# Patient Record
Sex: Female | Born: 1960 | Race: White | Hispanic: No | Marital: Married | Smoking: Never smoker
Health system: Southern US, Community
[De-identification: ages and names within clinical notes are randomized; demographics above are authoritative.]

## PROBLEM LIST (undated history)

## (undated) DIAGNOSIS — L409 Psoriasis, unspecified: Secondary | ICD-10-CM

## (undated) DIAGNOSIS — K219 Gastro-esophageal reflux disease without esophagitis: Secondary | ICD-10-CM

## (undated) DIAGNOSIS — E221 Hyperprolactinemia: Secondary | ICD-10-CM

## (undated) HISTORY — DX: Psoriasis, unspecified: L40.9

## (undated) HISTORY — DX: Hyperprolactinemia: E22.1

## (undated) HISTORY — PX: OTHER SURGICAL HISTORY: SHX169

## (undated) HISTORY — PX: FOOT SURGERY: SHX648

## (undated) HISTORY — PX: MOUTH SURGERY: SHX715

## (undated) HISTORY — PX: BUNIONECTOMY: SHX129

## (undated) HISTORY — DX: Gastro-esophageal reflux disease without esophagitis: K21.9

---

## 1997-08-15 ENCOUNTER — Other Ambulatory Visit: Admission: RE | Admit: 1997-08-15 | Discharge: 1997-08-15 | Payer: Self-pay | Admitting: Obstetrics and Gynecology

## 1998-03-06 ENCOUNTER — Ambulatory Visit (HOSPITAL_COMMUNITY): Admission: RE | Admit: 1998-03-06 | Discharge: 1998-03-06 | Payer: Self-pay | Admitting: General Surgery

## 1998-03-06 ENCOUNTER — Encounter: Payer: Self-pay | Admitting: General Surgery

## 1998-03-08 ENCOUNTER — Ambulatory Visit (HOSPITAL_COMMUNITY): Admission: RE | Admit: 1998-03-08 | Discharge: 1998-03-08 | Payer: Self-pay

## 1998-03-08 ENCOUNTER — Encounter: Payer: Self-pay | Admitting: General Surgery

## 1998-08-09 ENCOUNTER — Other Ambulatory Visit: Admission: RE | Admit: 1998-08-09 | Discharge: 1998-08-09 | Payer: Self-pay | Admitting: Obstetrics and Gynecology

## 1998-09-15 ENCOUNTER — Encounter: Payer: Self-pay | Admitting: Obstetrics and Gynecology

## 1998-09-15 ENCOUNTER — Ambulatory Visit (HOSPITAL_COMMUNITY): Admission: RE | Admit: 1998-09-15 | Discharge: 1998-09-15 | Payer: Self-pay | Admitting: Obstetrics and Gynecology

## 1999-04-09 ENCOUNTER — Ambulatory Visit (HOSPITAL_COMMUNITY): Admission: RE | Admit: 1999-04-09 | Discharge: 1999-04-09 | Payer: Self-pay | Admitting: General Surgery

## 1999-04-09 ENCOUNTER — Encounter: Payer: Self-pay | Admitting: General Surgery

## 1999-08-09 ENCOUNTER — Other Ambulatory Visit: Admission: RE | Admit: 1999-08-09 | Discharge: 1999-08-09 | Payer: Self-pay | Admitting: Obstetrics and Gynecology

## 1999-09-11 ENCOUNTER — Ambulatory Visit (HOSPITAL_COMMUNITY): Admission: RE | Admit: 1999-09-11 | Discharge: 1999-09-11 | Payer: Self-pay | Admitting: Obstetrics and Gynecology

## 1999-09-11 ENCOUNTER — Encounter: Payer: Self-pay | Admitting: Obstetrics and Gynecology

## 1999-10-19 ENCOUNTER — Ambulatory Visit (HOSPITAL_COMMUNITY): Admission: RE | Admit: 1999-10-19 | Discharge: 1999-10-19 | Payer: Self-pay | Admitting: Gastroenterology

## 2000-02-19 HISTORY — PX: DILATION AND CURETTAGE OF UTERUS: SHX78

## 2000-02-19 HISTORY — PX: PELVIC LAPAROSCOPY: SHX162

## 2000-02-19 HISTORY — PX: HYSTEROSCOPY: SHX211

## 2000-04-10 ENCOUNTER — Encounter: Payer: Self-pay | Admitting: General Surgery

## 2000-04-10 ENCOUNTER — Ambulatory Visit (HOSPITAL_COMMUNITY): Admission: RE | Admit: 2000-04-10 | Discharge: 2000-04-10 | Payer: Self-pay | Admitting: General Surgery

## 2000-05-15 ENCOUNTER — Encounter (INDEPENDENT_AMBULATORY_CARE_PROVIDER_SITE_OTHER): Payer: Self-pay | Admitting: Specialist

## 2000-05-15 ENCOUNTER — Ambulatory Visit (HOSPITAL_COMMUNITY): Admission: RE | Admit: 2000-05-15 | Discharge: 2000-05-15 | Payer: Self-pay | Admitting: Obstetrics and Gynecology

## 2000-08-11 ENCOUNTER — Other Ambulatory Visit: Admission: RE | Admit: 2000-08-11 | Discharge: 2000-08-11 | Payer: Self-pay | Admitting: Obstetrics and Gynecology

## 2001-04-10 ENCOUNTER — Ambulatory Visit (HOSPITAL_COMMUNITY): Admission: RE | Admit: 2001-04-10 | Discharge: 2001-04-10 | Payer: Self-pay | Admitting: Obstetrics and Gynecology

## 2001-04-10 ENCOUNTER — Encounter: Payer: Self-pay | Admitting: Obstetrics and Gynecology

## 2001-04-14 ENCOUNTER — Encounter: Payer: Self-pay | Admitting: Obstetrics and Gynecology

## 2001-04-14 ENCOUNTER — Encounter: Admission: RE | Admit: 2001-04-14 | Discharge: 2001-04-14 | Payer: Self-pay | Admitting: Obstetrics and Gynecology

## 2001-05-25 ENCOUNTER — Encounter: Payer: Self-pay | Admitting: *Deleted

## 2001-05-25 ENCOUNTER — Emergency Department (HOSPITAL_COMMUNITY): Admission: EM | Admit: 2001-05-25 | Discharge: 2001-05-25 | Payer: Self-pay | Admitting: *Deleted

## 2001-08-13 ENCOUNTER — Other Ambulatory Visit: Admission: RE | Admit: 2001-08-13 | Discharge: 2001-08-13 | Payer: Self-pay | Admitting: Obstetrics and Gynecology

## 2001-08-22 ENCOUNTER — Ambulatory Visit (HOSPITAL_COMMUNITY): Admission: RE | Admit: 2001-08-22 | Discharge: 2001-08-22 | Payer: Self-pay | Admitting: Obstetrics and Gynecology

## 2001-08-22 ENCOUNTER — Encounter: Payer: Self-pay | Admitting: Obstetrics and Gynecology

## 2002-01-11 ENCOUNTER — Encounter (INDEPENDENT_AMBULATORY_CARE_PROVIDER_SITE_OTHER): Payer: Self-pay

## 2002-01-11 ENCOUNTER — Inpatient Hospital Stay (HOSPITAL_COMMUNITY): Admission: RE | Admit: 2002-01-11 | Discharge: 2002-01-13 | Payer: Self-pay | Admitting: Obstetrics and Gynecology

## 2002-01-11 HISTORY — PX: TOTAL ABDOMINAL HYSTERECTOMY: SHX209

## 2002-01-18 ENCOUNTER — Encounter: Payer: Self-pay | Admitting: Obstetrics and Gynecology

## 2002-01-18 ENCOUNTER — Ambulatory Visit (HOSPITAL_COMMUNITY): Admission: RE | Admit: 2002-01-18 | Discharge: 2002-01-18 | Payer: Self-pay | Admitting: Obstetrics and Gynecology

## 2002-04-12 ENCOUNTER — Encounter: Payer: Self-pay | Admitting: Obstetrics and Gynecology

## 2002-04-12 ENCOUNTER — Ambulatory Visit (HOSPITAL_COMMUNITY): Admission: RE | Admit: 2002-04-12 | Discharge: 2002-04-12 | Payer: Self-pay | Admitting: Obstetrics and Gynecology

## 2002-07-27 ENCOUNTER — Other Ambulatory Visit: Admission: RE | Admit: 2002-07-27 | Discharge: 2002-07-27 | Payer: Self-pay | Admitting: Obstetrics and Gynecology

## 2003-04-22 ENCOUNTER — Ambulatory Visit (HOSPITAL_COMMUNITY): Admission: RE | Admit: 2003-04-22 | Discharge: 2003-04-22 | Payer: Self-pay | Admitting: Obstetrics and Gynecology

## 2003-07-28 ENCOUNTER — Other Ambulatory Visit: Admission: RE | Admit: 2003-07-28 | Discharge: 2003-07-28 | Payer: Self-pay | Admitting: Obstetrics and Gynecology

## 2004-04-25 ENCOUNTER — Ambulatory Visit (HOSPITAL_COMMUNITY): Admission: RE | Admit: 2004-04-25 | Discharge: 2004-04-25 | Payer: Self-pay | Admitting: General Surgery

## 2004-08-08 ENCOUNTER — Other Ambulatory Visit: Admission: RE | Admit: 2004-08-08 | Discharge: 2004-08-08 | Payer: Self-pay | Admitting: Obstetrics and Gynecology

## 2005-05-06 ENCOUNTER — Ambulatory Visit (HOSPITAL_COMMUNITY): Admission: RE | Admit: 2005-05-06 | Discharge: 2005-05-06 | Payer: Self-pay | Admitting: Obstetrics and Gynecology

## 2005-08-12 ENCOUNTER — Other Ambulatory Visit: Admission: RE | Admit: 2005-08-12 | Discharge: 2005-08-12 | Payer: Self-pay | Admitting: Obstetrics and Gynecology

## 2006-05-12 ENCOUNTER — Ambulatory Visit (HOSPITAL_COMMUNITY): Admission: RE | Admit: 2006-05-12 | Discharge: 2006-05-12 | Payer: Self-pay | Admitting: General Surgery

## 2006-08-14 ENCOUNTER — Other Ambulatory Visit: Admission: RE | Admit: 2006-08-14 | Discharge: 2006-08-14 | Payer: Self-pay | Admitting: Obstetrics and Gynecology

## 2007-02-28 ENCOUNTER — Emergency Department (HOSPITAL_COMMUNITY): Admission: EM | Admit: 2007-02-28 | Discharge: 2007-02-28 | Payer: Self-pay | Admitting: Emergency Medicine

## 2007-03-05 ENCOUNTER — Encounter: Admission: RE | Admit: 2007-03-05 | Discharge: 2007-03-27 | Payer: Self-pay | Admitting: Family Medicine

## 2007-05-15 ENCOUNTER — Encounter: Admission: RE | Admit: 2007-05-15 | Discharge: 2007-05-15 | Payer: Self-pay | Admitting: Obstetrics and Gynecology

## 2007-08-17 ENCOUNTER — Other Ambulatory Visit: Admission: RE | Admit: 2007-08-17 | Discharge: 2007-08-17 | Payer: Self-pay | Admitting: Obstetrics and Gynecology

## 2008-04-18 ENCOUNTER — Ambulatory Visit: Payer: Self-pay | Admitting: Obstetrics and Gynecology

## 2008-05-16 ENCOUNTER — Encounter: Admission: RE | Admit: 2008-05-16 | Discharge: 2008-05-16 | Payer: Self-pay | Admitting: Addiction Medicine

## 2008-08-17 ENCOUNTER — Encounter: Payer: Self-pay | Admitting: Obstetrics and Gynecology

## 2008-08-17 ENCOUNTER — Ambulatory Visit: Payer: Self-pay | Admitting: Obstetrics and Gynecology

## 2008-08-17 ENCOUNTER — Other Ambulatory Visit: Admission: RE | Admit: 2008-08-17 | Discharge: 2008-08-17 | Payer: Self-pay | Admitting: Obstetrics and Gynecology

## 2008-08-19 ENCOUNTER — Ambulatory Visit: Payer: Self-pay | Admitting: Obstetrics and Gynecology

## 2008-09-06 ENCOUNTER — Ambulatory Visit: Payer: Self-pay | Admitting: Obstetrics and Gynecology

## 2008-10-06 ENCOUNTER — Ambulatory Visit: Payer: Self-pay | Admitting: Obstetrics and Gynecology

## 2008-11-04 ENCOUNTER — Encounter: Admission: RE | Admit: 2008-11-04 | Discharge: 2008-11-04 | Payer: Self-pay | Admitting: Family Medicine

## 2008-12-23 ENCOUNTER — Ambulatory Visit: Payer: Self-pay | Admitting: Family Medicine

## 2008-12-23 DIAGNOSIS — J309 Allergic rhinitis, unspecified: Secondary | ICD-10-CM | POA: Insufficient documentation

## 2008-12-23 DIAGNOSIS — J029 Acute pharyngitis, unspecified: Secondary | ICD-10-CM

## 2008-12-24 ENCOUNTER — Encounter: Payer: Self-pay | Admitting: Family Medicine

## 2009-04-21 ENCOUNTER — Ambulatory Visit: Payer: Self-pay | Admitting: Obstetrics and Gynecology

## 2009-05-22 ENCOUNTER — Encounter: Admission: RE | Admit: 2009-05-22 | Discharge: 2009-05-22 | Payer: Self-pay | Admitting: Obstetrics and Gynecology

## 2009-09-06 ENCOUNTER — Other Ambulatory Visit: Admission: RE | Admit: 2009-09-06 | Discharge: 2009-09-06 | Payer: Self-pay | Admitting: Obstetrics and Gynecology

## 2009-09-06 ENCOUNTER — Ambulatory Visit: Payer: Self-pay | Admitting: Obstetrics and Gynecology

## 2010-04-27 ENCOUNTER — Other Ambulatory Visit: Payer: Self-pay | Admitting: Obstetrics and Gynecology

## 2010-04-27 DIAGNOSIS — Z1231 Encounter for screening mammogram for malignant neoplasm of breast: Secondary | ICD-10-CM

## 2010-05-29 ENCOUNTER — Ambulatory Visit
Admission: RE | Admit: 2010-05-29 | Discharge: 2010-05-29 | Disposition: A | Discharge status: Home / Self Care | Payer: Self-pay | Source: Ambulatory Visit | Attending: Obstetrics and Gynecology | Admitting: Obstetrics and Gynecology

## 2010-05-29 DIAGNOSIS — Z1231 Encounter for screening mammogram for malignant neoplasm of breast: Secondary | ICD-10-CM

## 2010-07-06 NOTE — Op Note (Signed)
NAMEALIMA, Hailey [name]                           ACCOUNT NO.:  [id number]   MEDICAL RECORD NO.:  [id number]                   PATIENT TYPE:  INP   LOCATION:  X010                                 FACILITY:  [facility]   PHYSICIAN:  Daniel L. Eda Paschal, M.D.           DATE OF BIRTH:  [birth year]   DATE OF PROCEDURE:  01/11/2002  DATE OF DISCHARGE:                                 OPERATIVE REPORT   PREOPERATIVE DIAGNOSES:  Severe dysmenorrhea, endometriosis.   POSTOPERATIVE DIAGNOSES:  1. Severe dysmenorrhea, endometriosis.  2. Right ovarian cyst.   OPERATION:  Exploratory laparotomy with total abdominal hysterectomy,  fulguration of endometriosis of the left ovary and right ovarian cystectomy.   SURGEON:  Daniel L. Eda Paschal, M.D.   FIRST ASSISTANT:  Katy Fitch, M.D.   ANESTHESIA:  General endotracheal anesthesia.   FINDINGS:  At the time of laparotomy the patient's uterus was slightly  enlarged and  boggy. The left ovary had some  filmy adhesions and in  addition it had some surface endometriosis that looked like black  endometriosis and appeared to be more superficial than deep. The total area  involved was about 0.5 cm. The cul-de-sac was free of disease. The pelvic  peritoneum was free of disease. The ileocecal junction was identified and  the appendix was normal. The right ovary had a 4+ cm right ovarian cyst  which appeared to be functional and other than that, that side was normal.   DESCRIPTION OF PROCEDURE:  After adequate general endotracheal anesthesia,  the patient was placed in the supine position and prepped and draped in the  usual sterile manner. The Foley catheter was inserted in the patient's  bladder. A Pfannenstiel incision was made. The fascia was opened  transversely. The peritoneum was entered vertically. Subcutaneous bleeders  were clamped and bovied as encountered. When the peritoneal cavity was  opened the above findings were noted. The round ligaments  were bovied and  cut. The vesicouterine fold, the peritoneum and the posterior peritoneum  were sharply dissected free. Initially the thought was that the adnexa would  be left intact and therefore the utero-ovarian ligaments and fallopian tubes  were clamped, cut and doubly suture ligated with #1 chromic cat  gut. The  uterine arteries were clamped, cut and doubly suture ligated with #1 chromic  cat gut. The peritoneum posteriorly was sharply dissected free and then the  parametrium was taken down in successive bites by clamping and cutting and  suture ligated with #1 chromic cat gut. The cervical vaginal junction was  identified and entered by sharp dissection. The uterus was removed and sent  to pathology for tissue diagnosis. Angled sutures were placed in the angles  of the vagina with #1 chromic cat gut incorporating vaginal cuff,  uterosacral ligaments and parametrium for good vault support. The cuff was  then  closed with a running locking 3-0 Vicryl suture. The endometriosis in  the left ovary was minimal; it was fulgurated until it was gone. At this  point the right ovarian cyst had decompressed on its own, and it was noted  to be bleeding from the ovarian cyst wall, it was felt prudent to remove it.  Therefore the ovarian cyst was removed and then the defect in the ovarian  capsule was closed with a running 3-0 Vicryl. Estimated blood loss so far  was 250 cc; no blood was replaced. Copious irrigation was done with Ringer's  lactate. Two sponge,  needle and instrument counts were correct. The  peritoneum was closed with a  running 0 Vicryl. The sub and superfascial spaces were copiously irrigated  with Ringer's lactate. The fascia was closed with two running 0 Vicryl and  the skin was closed with staples. The patient left the operating room in  satisfactory condition draining clear urine from her Foley catheter.                                               Daniel L. Eda Paschal,  M.D.    Hailey [name]  D:  01/11/2002  T:  01/11/2002  Job:  [id number]

## 2010-07-06 NOTE — Discharge Summary (Signed)
   Hailey [name], Hailey [name]                           ACCOUNT NO.:  [id number]   MEDICAL RECORD NO.:  [id number]                   PATIENT TYPE:  INP   LOCATION:  0463                                 FACILITY:  [facility]   PHYSICIAN:  Rande Brunt. Eda Paschal, M.D.           DATE OF BIRTH:  09/13/1960   DATE OF ADMISSION:  01/11/2002  DATE OF DISCHARGE:  01/13/2002                                 DISCHARGE SUMMARY   HISTORY OF PRESENT ILLNESS:  The patient is a 50 year old female who entered  the hospital with dysmenorrhea, pelvic pain, endometriosis for definitive  surgery.  On the day of admission she was taken to the operating room.  Total abdominal hysterectomy, fulguration of endometriosis of left ovary,  right ovarian cystectomy were done.  Postoperatively she had a mild ileus  but by the second postoperative day was ready for discharge.   DISCHARGE MEDICATIONS:  Tylox for pain relief.   DISCHARGE DIET:  Soft.   DISCHARGE ACTIVITIES:  Ambulatory.   WOUND CARE:  Routine.   FOLLOW UP:  She will return to our office in two days for staple removal.   PATHOLOGY:  Final pathology report revealed multiple leiomyoma and a benign  corpus luteal cyst of right ovary.   CONDITION ON DISCHARGE:  Improved.   DISCHARGE DIAGNOSES:  1. Pelvic pain.  2. Dysmenorrhea.  3. Leiomyoma uteri.  4. Endometriosis.   OPERATION:  1. Total abdominal hysterectomy.  2. Fulguration of endometriosis of left ovary.  3. Right ovarian cystectomy.                                               Daniel L. Eda Paschal, M.D.    Hailey [name]  D:  01/13/2002  T:  01/13/2002  Job:  [id number]

## 2010-07-06 NOTE — Op Note (Signed)
[facility]  Patient:    Hailey [name], Hailey [name]                        MRN: [id number] Proc. Date: 05/15/00 Adm. Date:  [id number] Attending:  Sharon Mt                           Operative Report  PREOPERATIVE DIAGNOSES:  Endometrial polyp, back pain, endometriosis strongly suspected.  POSTOPERATIVE DIAGNOSES:  Endometrial polyp, back pain, endometriosis with pelvic adhesive disease.  OPERATION PERFORMED:  Hysteroscopy with resection of polyp and endometrial sampling followed by diagnostic laparoscopy with laser vaporization of endometriosis and pelvic adhesive disease.  SURGEON:  Dr. Eda Paschal.  ANESTHESIA:  General endotracheal.  INDICATIONS FOR PROCEDURE:  The patient is a 50 year old female who had a previous laparoscopy in the 1990s for endometriosis. Recently she started to notice a severe increase in lower back pain and dysmenorrhea and she feels like her endometriosis has recurred. It has not responded to medical therapy and she now enters the hospital for laparoscopy for evaluation and treatment of the above. When she had an ultrasound done preoperatively to look for endometriomas, there was an intrauterine defect which was confirmed on sonohysterogram and so she will also undergo a hysteroscopy with removal of any intrauterine pathology.  FINDINGS:  At the time of hysteroscopy, the patient had a small polyp on the left lateral wall slightly anterior exactly where the defect was on sonohysterogram. It was less than a centimeter. It was adherent but could easily be detached from the wall. After this had been detached, tubal ostia, the top of the fundus, anterior and posterior walls of the fundus, lower uterine segment and then the cervix were all free of disease.  At the time of laparoscopy, the patients myoma which had previously been seen was still present. It was approximately 3.5 cm, it was subserosal. The right fallopian tube and  ovary were visualized and she had pelvic adhesive disease between the right tube and the ovary and in addition she had some endometriosis on the ovary as well. Total area of endometriosis involved was approximately 2 cm. It was more superficial than deep. Adhesions were more of the mild type than the moderate or severe. Fimbria were normal. There was no loss of mobility of the right adnexa as a result of this. On the left side; however, the left ovary was densely adherent to the broad ligament because of endometriosis. There were endometrial implants on the ovary as well. There were adhesions between the ovary and tube on the left that were more significant than the ones on the right. Total area involved was approximately 3 cm. Again it was more superficial than deep. The patient had normal fimbria on the left. The patient also had cul-de-sac, endometriosis. It was both pigmented and non-pigmented. It was also associated with some mild adhesive disease. Once again it was more superficial than deep. Total area involved was 4-5 cm. The ileocecal junction was identified, appendix was normal. The right upper quadrant was visualized and was normal.  DESCRIPTION OF PROCEDURE:  After adequate general endotracheal anesthesia, the patient was placed in the dorsal lithotomy position, prepped and draped in the usual sterile manner. First attention was focused to her hysteroscopy. A single tooth tenaculum was placed in the anterior lip of the cervix. The cervix was dilated to a #35 Pratt dilator and hysteroscopic examination was done with the  hysteroscopic resectoscope. Three percent sorbitol was used to expand the intrauterine cavity and a camera was used for magnification. The defect described above was localized. It was removed with a 90 degree wire loop with settings of 70 coag, 110 cutting. The tissue was sent to pathology for tissue diagnosis, endometrial sampling was also obtained. There was  no bleeding at the termination of the procedure and there was on significant fluid deficit. At this point, the surgeon regloved and attention was turned to the laparoscopy. A pneumoperitoneum was created with a Veress needle subumbilically and then the operating laparoscope was placed subumbilically through a 10 mm trocar. A 5 mm port was placed suprapubically. Prior to starting the laparoscopy, her bladder was emptied with a Robinson catheter. The pelvis was visualized and was as described above. Pictures were taken for documentation. Using a neodymium YAG laser with a GR-6 tip over the bare fiber, all adhesive disease was lasered until everything was free. Adhesions were removed and sent for tissue diagnosis. All areas of endometriosis were excised or laser vaporized so that at the termination of the procedure, the patient had free adnexa without adhesive disease and no active endometriosis. There were several areas of bleeding on the ovary that were controlled with bipolar coagulation. There was also a little bleeder on the surface of the myoma from moving the myoma which was also controlled with the bipolar. At the termination of the procedure, all cul-de-sac fluid was removed. Because of significant adhesive, it was felt that she would readhere and therefore 300 cc of ______ were placed through the 5 mm port. The pneumoperitoneum was evacuated. The skin incisions were closed first with #0 Vicryl and then with 3-0 monocryl. The patient tolerated the procedure well and left the operating room in satisfactory condition. DD:  05/15/00 TD:  05/15/00 Job: [id number]

## 2010-07-06 NOTE — H&P (Signed)
Hailey [name], Hailey [name]                           ACCOUNT NO.:  [id number]   MEDICAL RECORD NO.:  [id number]                   PATIENT TYPE:  INP   LOCATION:  0463                                 FACILITY:  [facility]   PHYSICIAN:  Rande Brunt. Eda Paschal, M.D.           DATE OF BIRTH:  22-Mar-1960   DATE OF ADMISSION:  01/11/2002  DATE OF DISCHARGE:                                HISTORY & PHYSICAL   CHIEF COMPLAINT:  Dysmenorrhea with endometriosis.   HISTORY OF PRESENT ILLNESS:  The patient is a 50 year old gravida 2, para 1,  AB 1, who has a history of progressively increasing dysmenorrhea associated  with pelvic pain and a history of endometriosis.  The patient has had  several diagnostic laparoscopies for endometriosis with response afterwards,  but her dysmenorrhea has progressed in spite of it.  She feels like her  whole life is centered around her periods now.  She no longer is interested  in conceiving.  She has tried a variety of non-steroidal anti-inflammatory  drugs without any response, and has not responded well to oral  contraceptives, and therefore now enters the hospital for total abdominal  hysterectomy.  At the time of her last laparoscopy, she had extensive  disease, especially involving the left ovary and tube, and as a result of  the above, she is going to undergo a bowel prep preoperatively in case there  is colon involvement, and has given me permission to remove one ovary and  fallopian tube for severe endometriosis or both for either cancer or just  inoperable endometriosis.  She would very much like to keep one or actually  preferably both ovaries.   PAST MEDICAL HISTORY:  1. Three laparoscopies, 1991, 1993, and 2002.  2. Bunionectomy in 1994.  3. Wisdom teeth extraction in 1979.   MEDICATIONS:  Nexium 40 mg q.d.   ALLERGIES:  CODEINE.   FAMILY HISTORY:  Both grandmothers, aunt, and an uncle have had coronary  artery disease.  Mother has had breast cancer.   Father has had other  malignancy.  Great-grandmother is diabetic, and father is hypertensive.   SOCIAL HISTORY:  She is a nonsmoker, nondrinker.   REVIEW OF SYMPTOMS:  HEENT:  Negative.  CARDIAC:  Negative.  RESPIRATORY:  Negative.  GASTROINTESTINAL:  History of gastroesophageal reflux disease, on  Nexium, and hemorrhoids.  MUSCULOSKELETAL:  Negative.  GENITOURINARY:  Negative.  NEUROLOGIC:  Negative.  PSYCHIATRIC:  Negative.  ALLERGIC/IMMUNOLOGIC:  Negative.  LYMPHATIC/ENDOCRINE:  Negative.   PHYSICAL EXAMINATION:  GENERAL:  The patient is a well-developed, well-  nourished female in no acute distress.  VITAL SIGNS:  Blood pressure is 130/70, pulse is 80 and regular,  respirations are 16 and non labored, she is afebrile.  HEENT:  All within normal limits.  NECK:  Supple, trachea in the midline.  Thyroid is not enlarged.  LUNGS:  Clear to auscultation and percussion.  HEART:  No  thrills, heaves, or murmurs.  BREASTS:  No masses.  ABDOMEN:  Soft, without guarding, rebound, or masses.  PELVIC:  External and vagina within normal limits.  Cervix is clean.  Pap  smear shows no atypia.  Uterus is anteverted, normal size and shape.  Adnexae are palpably normal.  RECTAL:  Negative.  EXTREMITIES:  Within normal limits.   ADMISSION IMPRESSION:  Severe endometriosis.   PLAN:  Total abdominal hysterectomy.                                                 Daniel L. Eda Paschal, M.D.    Tonette Bihari  D:  01/11/2002  T:  01/11/2002  Job:  [id number]

## 2010-07-06 NOTE — Procedures (Signed)
[facility]  Patient:    Hailey [name], Hailey [name]                        MRN: [id number] Proc. Date: 10/19/99 Adm. Date:  [id number] Disc. Date: [id number] Attending:  Louie Bun                           Procedure Report  PROCEDURE:  Esophagogastroduodenoscopy.  INDICATION FOR PROCEDURE:  Increasingly refractory reflux symptoms recently not completely suppressed by a double-dose of a proton pump inhibitor.  DESCRIPTION OF PROCEDURE:  The patient was placed in the left lateral decubitus position and placed on the pulse monitor with continuous low-flow oxygen delivered by nasal cannula.  She was sedated with 50 mg IV Demerol and 5 mg IV Versed.  The Olympus video endoscope was advanced under direct vision into the oropharynx and esophagus.  The esophagus was straight and of normal caliber with the squamocolumnar line at 38 cm.  There was no visible hiatal hernia, ring, stricture, esophagitis, or other abnormality at the GE junction. The stomach was entered, and a small amount of liquid secretions were suctioned from the fundus.  Retroflex view of the cardia was unremarkable. The fundus, body, antrum, and pylorus all appeared normal.  The  duodenum was entered, and both the bulb and the second portion were well inspected and appeared to be within normal limits.  The endoscope was then withdrawn, and the patient returned to the recovery room in stable condition.  She tolerated the procedure well, and there were no immediate complications.  IMPRESSION:  Essentially normal endoscopy.  PLAN:  Will substitute Nexium for Prilosec, reinforce dietary and lifestyle modifications for reflux, and follow up in the office in a few weeks. DD:  10/19/99 TD:  10/21/99 Job: [id number]

## 2010-09-25 ENCOUNTER — Ambulatory Visit (INDEPENDENT_AMBULATORY_CARE_PROVIDER_SITE_OTHER): Payer: BC Managed Care – PPO | Admitting: Obstetrics and Gynecology

## 2010-09-25 ENCOUNTER — Encounter: Payer: Self-pay | Admitting: Obstetrics and Gynecology

## 2010-09-25 ENCOUNTER — Other Ambulatory Visit (HOSPITAL_COMMUNITY)
Admission: RE | Admit: 2010-09-25 | Discharge: 2010-09-25 | Disposition: A | Discharge status: Home / Self Care | Payer: BC Managed Care – PPO | Source: Ambulatory Visit | Attending: Obstetrics and Gynecology | Admitting: Obstetrics and Gynecology

## 2010-09-25 VITALS — BP 124/74 | Ht 63.0 in | Wt 138.0 lb

## 2010-09-25 DIAGNOSIS — E229 Hyperfunction of pituitary gland, unspecified: Secondary | ICD-10-CM

## 2010-09-25 DIAGNOSIS — Z01419 Encounter for gynecological examination (general) (routine) without abnormal findings: Secondary | ICD-10-CM

## 2010-09-25 DIAGNOSIS — Z833 Family history of diabetes mellitus: Secondary | ICD-10-CM

## 2010-09-25 DIAGNOSIS — Z0000 Encounter for general adult medical examination without abnormal findings: Secondary | ICD-10-CM

## 2010-09-25 DIAGNOSIS — E221 Hyperprolactinemia: Secondary | ICD-10-CM

## 2010-09-25 DIAGNOSIS — Z8349 Family history of other endocrine, nutritional and metabolic diseases: Secondary | ICD-10-CM

## 2010-09-25 DIAGNOSIS — R82998 Other abnormal findings in urine: Secondary | ICD-10-CM

## 2010-09-25 NOTE — Progress Notes (Signed)
The patient came to see me today for her annual GYN exam. She has noticed vaginal dryness with dyspareunia. She has mild hot flashes that she handles with over-the-counter medicine. She has diminished libido. She is up-to-date on mammograms. She is due for followup bone density that she her in our office. She does have some urgency of urination and nocturia x1.  Physical examination: HEENT within normal limits. Neck: Thyroid not large. No masses. Supraclavicular nodes: not enlarged. Breasts: Examined in both sitting midline position. No skin changes and no masses. Abdomen: Soft no guarding rebound or masses or hernia. Pelvic: External: Within normal limits. BUS: Within normal limits. Vaginal:within normal limits. poor estrogen effect. Patient has a 11/2 cystocele. No evidence of  rectocele or enterocele. Cervix: clean. Uterus: Normal size and shape. Adnexa: No masses. Rectovaginal exam: Confirmatory and negative. Extremities: Within normal limits.  Assessment: 1. Menopausal symptoms 2. Cystocele 3. History of hyperprolactinemia  Plan: 1. Estradiol cream 0.02% 3 times a week in the vagina 2. Testosterone cream 2% applied clitorally at bedtime.

## 2010-09-25 NOTE — Progress Notes (Addendum)
Addended byCammie Mcgee T on: 09/25/2010 09:49 AM  Modules accepted: Orders These prolactin has remained stable at 41. Dr.Dorhmeier had told me that if it was stable she did not need another MRI.

## 2010-10-17 ENCOUNTER — Other Ambulatory Visit: Payer: Self-pay

## 2010-10-17 ENCOUNTER — Ambulatory Visit (INDEPENDENT_AMBULATORY_CARE_PROVIDER_SITE_OTHER): Payer: BC Managed Care – PPO | Admitting: Gynecology

## 2010-10-17 DIAGNOSIS — Z13820 Encounter for screening for osteoporosis: Secondary | ICD-10-CM

## 2010-11-07 ENCOUNTER — Encounter: Payer: Self-pay | Admitting: Obstetrics and Gynecology

## 2010-11-07 LAB — BASIC METABOLIC PANEL
Calcium: 9.1
Chloride: 106
Creatinine, Ser: 0.69
GFR calc Af Amer: >60
GFR calc non Af Amer: >60

## 2010-11-07 LAB — URINALYSIS, ROUTINE W REFLEX MICROSCOPIC
Glucose, UA: NEGATIVE
Protein, ur: NEGATIVE

## 2010-11-18 ENCOUNTER — Other Ambulatory Visit: Payer: Self-pay | Admitting: Obstetrics and Gynecology

## 2010-11-18 DIAGNOSIS — F419 Anxiety disorder, unspecified: Secondary | ICD-10-CM

## 2011-04-23 ENCOUNTER — Other Ambulatory Visit: Payer: Self-pay | Admitting: Obstetrics and Gynecology

## 2011-04-23 DIAGNOSIS — Z1231 Encounter for screening mammogram for malignant neoplasm of breast: Secondary | ICD-10-CM

## 2011-06-04 ENCOUNTER — Ambulatory Visit
Admission: RE | Admit: 2011-06-04 | Discharge: 2011-06-04 | Disposition: A | Discharge status: Home / Self Care | Payer: BC Managed Care – PPO | Source: Ambulatory Visit | Attending: Obstetrics and Gynecology | Admitting: Obstetrics and Gynecology

## 2011-06-04 DIAGNOSIS — Z1231 Encounter for screening mammogram for malignant neoplasm of breast: Secondary | ICD-10-CM

## 2011-07-12 ENCOUNTER — Other Ambulatory Visit: Payer: Self-pay | Admitting: Obstetrics and Gynecology

## 2011-07-16 ENCOUNTER — Telehealth: Payer: Self-pay | Admitting: *Deleted

## 2011-07-16 NOTE — Telephone Encounter (Signed)
Verlon Au pharmacist at [facility] called to clarfiy direction for pt xanxa 0.25 mg # 30 with 2 refills, no directions on voicemail. I spoke with pt and she takes medication only as needed, leslie informed q8 hours okay for directions.

## 2011-09-26 ENCOUNTER — Ambulatory Visit (INDEPENDENT_AMBULATORY_CARE_PROVIDER_SITE_OTHER): Payer: BC Managed Care – PPO | Admitting: Obstetrics and Gynecology

## 2011-09-26 ENCOUNTER — Encounter: Payer: Self-pay | Admitting: Obstetrics and Gynecology

## 2011-09-26 VITALS — BP 120/76 | Ht 63.0 in | Wt 138.0 lb

## 2011-09-26 DIAGNOSIS — Z833 Family history of diabetes mellitus: Secondary | ICD-10-CM

## 2011-09-26 DIAGNOSIS — Z01419 Encounter for gynecological examination (general) (routine) without abnormal findings: Secondary | ICD-10-CM

## 2011-09-26 DIAGNOSIS — E229 Hyperfunction of pituitary gland, unspecified: Secondary | ICD-10-CM

## 2011-09-26 LAB — CBC WITH DIFFERENTIAL/PLATELET
Eosinophils Absolute: 0.3 10*3/uL (ref 0.0–0.7)
Eosinophils Relative: 6 % — ABNORMAL HIGH (ref 0–5)
HCT: 40.9 % (ref 36.0–46.0)
Hemoglobin: 14.7 g/dL (ref 12.0–15.0)
Lymphocytes Relative: 31 % (ref 12–46)
Lymphs Abs: 1.4 10*3/uL (ref 0.7–4.0)
MCH: 30.5 pg (ref 26.0–34.0)
MCV: 84.9 fL (ref 78.0–100.0)
Monocytes Relative: 6 % (ref 3–12)
Platelets: 252 10*3/uL (ref 150–400)
RBC: 4.82 MIL/uL (ref 3.87–5.11)
WBC: 4.6 10*3/uL (ref 4.0–10.5)

## 2011-09-26 LAB — LIPID PANEL
Cholesterol: 300 mg/dL — ABNORMAL HIGH (ref 0–200)
HDL: 56 mg/dL (ref >39)
LDL Cholesterol: 212 mg/dL — ABNORMAL HIGH (ref 0–99)
Triglycerides: 161 mg/dL — ABNORMAL HIGH (ref <150)

## 2011-09-26 NOTE — Progress Notes (Signed)
Patient came to see me today for her annual GYN exam. She has had elevated prolactin over the past 15 years. In 2001 she had a normal MRI. 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 In 2003 MRI showed resolution. Her prolactin several remains stable. They have reduced in number from the 80s to the last 3 years when they have been 45 then 39 been last year 51. Dr. Vickey Huger  felt that as long as they stayed stable no further MRIs were necessary. The patient does not have galactorrhea currently. She has never had an abnormal Pap smear. Her last Pap smear was 2012. She is status post TAH for endometriosis. She is up-to-date on mammograms. She had a normal bone density last year. Her mother developed breast cancer in her early 45s and is still alive although now has ALS. She is menopausal but is doing well without HRT. She has had no vaginal bleeding. She has had no pelvic pain.  HEENT: Within normal limits. [name] present. Neck: No masses. Supraclavicular lymph nodes: Not enlarged. Breasts: Examined in both sitting and lying position. Symmetrical without skin changes or masses. Abdomen: Soft no masses guarding or rebound. No hernias. Pelvic: External within normal limits. BUS within normal limits. Vaginal examination shows good estrogen effect, no cystocele enterocele or rectocele. Cervix and uterus absent. Adnexa within normal limits. Rectovaginal confirmatory. Extremities within normal limits.  Assessment: Hyperprolactinemia. Mother with early onset breast cancer.  Plan: Continue yearly mammograms. Continue periodic bone densities. She has now had 2 normal bone densities.The new Pap smear guidelines were discussed with the patient. No pap done. Discussed having mother checked for BRCA1 and 2. She will let me know. Prolactin checked.

## 2011-09-26 NOTE — Patient Instructions (Signed)
Please take mother to cancer center for evaluation of BRCA1 and BRCA2 testing.

## 2011-09-27 LAB — URINALYSIS W MICROSCOPIC + REFLEX CULTURE
Bacteria, UA: NONE SEEN
Bilirubin Urine: NEGATIVE
Casts: NONE SEEN
Crystals: NONE SEEN
Glucose, UA: NEGATIVE mg/dL
Hgb urine dipstick: NEGATIVE
Ketones, ur: NEGATIVE mg/dL
Leukocytes, UA: NEGATIVE
Nitrite: NEGATIVE
Protein, ur: NEGATIVE mg/dL
Specific Gravity, Urine: 1.014 (ref 1.005–1.030)
Squamous Epithelial / LPF: NONE SEEN
Urobilinogen, UA: 0.2 mg/dL (ref 0.0–1.0)
pH: 7.5 (ref 5.0–8.0)

## 2011-10-01 ENCOUNTER — Telehealth: Payer: Self-pay | Admitting: *Deleted

## 2011-10-01 NOTE — Telephone Encounter (Signed)
Pt is calling to follow up with last OV 09/26/11 regarding Brca 1 & 2 testing for her mother Levy Sjogren your pt as well). Her mother is willing to have testing done, but she has medicare as Editor, commissioning. She was told to check with insurance which she did and was told that the provider must call provider line at [phone] and speak with them about why pt would need this test.

## 2011-10-02 NOTE — Telephone Encounter (Signed)
Per Sherrilyn Rist pt mother can have blood work drawn here and the company will contact and check if insurance medicare will pay for testing. Pt okay and will talk to mother regarding this.

## 2012-03-02 ENCOUNTER — Other Ambulatory Visit: Payer: Self-pay | Admitting: Obstetrics and Gynecology

## 2012-03-03 NOTE — Telephone Encounter (Signed)
Has CE scheduled Oct 01, 2012 with Dr. Audie Box.

## 2012-03-03 NOTE — Telephone Encounter (Signed)
rx called into pharmacy

## 2012-05-06 ENCOUNTER — Other Ambulatory Visit: Payer: Self-pay | Admitting: Gynecology

## 2012-05-06 DIAGNOSIS — Z1231 Encounter for screening mammogram for malignant neoplasm of breast: Secondary | ICD-10-CM

## 2012-06-02 ENCOUNTER — Ambulatory Visit: Payer: BC Managed Care – PPO

## 2012-06-04 ENCOUNTER — Ambulatory Visit: Payer: BC Managed Care – PPO

## 2012-06-09 ENCOUNTER — Ambulatory Visit: Payer: BC Managed Care – PPO

## 2012-06-16 ENCOUNTER — Ambulatory Visit: Payer: BC Managed Care – PPO

## 2012-06-30 ENCOUNTER — Ambulatory Visit: Payer: BC Managed Care – PPO

## 2012-07-07 ENCOUNTER — Ambulatory Visit (INDEPENDENT_AMBULATORY_CARE_PROVIDER_SITE_OTHER): Payer: BC Managed Care – PPO

## 2012-07-07 DIAGNOSIS — Z1231 Encounter for screening mammogram for malignant neoplasm of breast: Secondary | ICD-10-CM

## 2012-07-14 ENCOUNTER — Telehealth: Payer: Self-pay | Admitting: *Deleted

## 2012-07-14 DIAGNOSIS — N6320 Unspecified lump in the left breast, unspecified quadrant: Secondary | ICD-10-CM

## 2012-07-14 NOTE — Telephone Encounter (Signed)
Pt called requesting order placed for breast center, mammogram result came back with possible breast mass. Order placed pt informed to call and schedule.

## 2012-07-22 ENCOUNTER — Ambulatory Visit
Admission: RE | Admit: 2012-07-22 | Discharge: 2012-07-22 | Disposition: A | Discharge status: Home / Self Care | Payer: BC Managed Care – PPO | Source: Ambulatory Visit | Attending: Gynecology | Admitting: Gynecology

## 2012-07-22 DIAGNOSIS — N6320 Unspecified lump in the left breast, unspecified quadrant: Secondary | ICD-10-CM

## 2012-09-29 ENCOUNTER — Encounter: Payer: Self-pay | Admitting: Gynecology

## 2012-09-29 ENCOUNTER — Encounter: Payer: BC Managed Care – PPO | Admitting: Gynecology

## 2012-09-29 ENCOUNTER — Ambulatory Visit (INDEPENDENT_AMBULATORY_CARE_PROVIDER_SITE_OTHER): Payer: BC Managed Care – PPO | Admitting: Gynecology

## 2012-09-29 ENCOUNTER — Encounter: Payer: Self-pay | Admitting: Obstetrics and Gynecology

## 2012-09-29 VITALS — BP 110/66 | Ht 63.0 in | Wt 140.0 lb

## 2012-09-29 DIAGNOSIS — E221 Hyperprolactinemia: Secondary | ICD-10-CM

## 2012-09-29 DIAGNOSIS — E229 Hyperfunction of pituitary gland, unspecified: Secondary | ICD-10-CM

## 2012-09-29 DIAGNOSIS — Z01419 Encounter for gynecological examination (general) (routine) without abnormal findings: Secondary | ICD-10-CM

## 2012-09-29 DIAGNOSIS — Z803 Family history of malignant neoplasm of breast: Secondary | ICD-10-CM

## 2012-09-29 DIAGNOSIS — Z13220 Encounter for screening for lipoid disorders: Secondary | ICD-10-CM

## 2012-09-29 LAB — CBC WITH DIFFERENTIAL/PLATELET
HCT: 40.4 % (ref 36.0–46.0)
Hemoglobin: 14.2 g/dL (ref 12.0–15.0)
Lymphocytes Relative: 13 % (ref 12–46)
Monocytes Absolute: 0.7 10*3/uL (ref 0.1–1.0)
Monocytes Relative: 6 % (ref 3–12)
Neutro Abs: 8.7 10*3/uL — ABNORMAL HIGH (ref 1.7–7.7)
Neutrophils Relative %: 81 % — ABNORMAL HIGH (ref 43–77)
RBC: 4.66 MIL/uL (ref 3.87–5.11)
WBC: 10.8 10*3/uL — ABNORMAL HIGH (ref 4.0–10.5)

## 2012-09-29 LAB — LIPID PANEL
HDL: 78 mg/dL (ref >39)
Triglycerides: 122 mg/dL (ref <150)

## 2012-09-29 LAB — COMPREHENSIVE METABOLIC PANEL
AST: 17 U/L (ref 0–37)
Albumin: 4.6 g/dL (ref 3.5–5.2)
BUN: 21 mg/dL (ref 6–23)
Calcium: 10.7 mg/dL — ABNORMAL HIGH (ref 8.4–10.5)
Chloride: 105 mEq/L (ref 96–112)
Glucose, Bld: 94 mg/dL (ref 70–99)
Potassium: 4.2 mEq/L (ref 3.5–5.3)
Sodium: 140 mEq/L (ref 135–145)
Total Protein: 7.2 g/dL (ref 6.0–8.3)

## 2012-09-29 MED ORDER — ALPRAZOLAM 0.25 MG PO TABS: ORAL_TABLET | ORAL | Status: DC

## 2012-09-29 NOTE — Progress Notes (Signed)
Hailey [name] Jun 16, 1960 [id number]        52 y.o.  G2P1011 for annual exam.  Former patient of Dr. Eda Paschal. Several issues noted below.  Past medical history,surgical history, medications, allergies, family history and social history were all reviewed and documented in the EPIC chart.  ROS:  Performed and pertinent positives and negatives are included in the history, assessment and plan .  Exam: Kim assistant Filed Vitals:   09/29/12 1018  BP: 110/66  Height: 5\' 3"  (1.6 m)  Weight: 140 lb (63.504 kg)   General appearance  Normal Skin grossly normal Head/Neck normal with no cervical or supraclavicular adenopathy thyroid normal Lungs  clear Cardiac RR, without RMG Abdominal  soft, nontender, without masses, organomegaly or hernia Breasts  examined lying and sitting without masses, retractions, discharge or axillary adenopathy. Pelvic  Ext/BUS/vagina  normal   Adnexa  Without masses or tenderness    Anus and perineum  normal   Rectovaginal  normal sphincter tone without palpated masses or tenderness.    Assessment/Plan:  52 y.o. G36P1011 female for annual exam.   1. History TAH for endometriosis. Without menopausal symptoms such as hot flashes night sweats vaginal dryness or dyspareunia. We'll continue to monitor. 2. History of hyperprolactinemia.  Prolactin values have been in the 80s and 40s with last check 2013 at 11. Suggestion of small prolactinoma on MRI 2002 but repeat MRI 2003 was normal. Prolactin done today. We'll continue to monitor as long as normal or low level elevated. No galactorrhea, headaches, visual changes. 3. Family history of breast cancer. Mother at age 90 and maternal aunt age 8. Had discussed genetic testing with Dr. Eda Paschal. Mother never did and she is now passed away. Recommended that patient go ahead and be tested for BRCA genes. Understands a negative does not guarantee that she does not carry a mutation but certainly a positive may consider  prophylactic surgeries or more intense surveillance. Patient agrees and we'll go ahead and have drawn today. 4. DEXA 2012 normal. Recommend repeat at 5 year interval. Increase calcium vitamin D reviewed. Check vitamin D level today. 5. Pap smear 2012. No Pap smear done today. Status post hysterectomy for benign indications. No history of abnormal Pap smears previously. Options to stop screening altogether versus less frequent screening intervals reviewed and we'll readdress on an annual basis. 6. Mammography 06/2012. Continue with annual mammography. SBE monthly reviewed. 7. Anxiety. Patient has intermittent mild anxiety for which she uses Xanax 0.25 mg when necessary. Does not use a lot of it. Only child going off to college this year. #30 with 3 refills provided. 8. Colonoscopy 2013. Repeat at their recommended interval. 9. Health maintenance. Baseline CBC comprehensive metabolic panel lipid profile urinalysis vitamin D ordered. Followup one year, sooner as needed.  Note: This document was prepared with digital dictation and possible smart phrase technology. Any transcriptional errors that result from this process are unintentional.   Dara Lords MD, 10:51 AM 09/29/2012

## 2012-09-29 NOTE — Patient Instructions (Signed)
Followup in one year, sooner as needed. Office will contact you with the breast cancer gene testing results.

## 2012-09-30 LAB — PROLACTIN: Prolactin: 12.6 ng/mL

## 2012-09-30 LAB — URINALYSIS W MICROSCOPIC + REFLEX CULTURE
Casts: NONE SEEN
Hgb urine dipstick: NEGATIVE
Leukocytes, UA: NEGATIVE
Nitrite: NEGATIVE
Specific Gravity, Urine: 1.009 (ref 1.005–1.030)
pH: 6.5 (ref 5.0–8.0)

## 2012-10-02 ENCOUNTER — Other Ambulatory Visit: Payer: Self-pay | Admitting: Gynecology

## 2012-10-09 ENCOUNTER — Telehealth: Payer: Self-pay | Admitting: *Deleted

## 2012-10-09 ENCOUNTER — Encounter: Payer: Self-pay | Admitting: Gynecology

## 2012-10-09 NOTE — Telephone Encounter (Signed)
Message copied by Aura Camps on Fri Oct 09, 2012  2:27 PM ------      Message from: Dara Lords      Created: Fri Oct 09, 2012 12:41 PM       Tell patient that her BRCA testing was negative. We are mailing her a copy of the results for her records.Marland Kitchen ------

## 2012-10-09 NOTE — Telephone Encounter (Signed)
Left the below on voicemail  

## 2012-10-23 ENCOUNTER — Encounter: Payer: Self-pay | Admitting: Gynecology

## 2012-10-28 ENCOUNTER — Other Ambulatory Visit: Payer: BC Managed Care – PPO

## 2012-10-28 ENCOUNTER — Other Ambulatory Visit: Payer: Self-pay | Admitting: Orthopedic Surgery

## 2012-10-28 DIAGNOSIS — M549 Dorsalgia, unspecified: Secondary | ICD-10-CM

## 2012-10-29 LAB — PTH, INTACT AND CALCIUM: Calcium: 10 mg/dL (ref 8.4–10.5)

## 2012-11-08 ENCOUNTER — Other Ambulatory Visit: Payer: BC Managed Care – PPO

## 2012-11-08 ENCOUNTER — Ambulatory Visit
Admission: RE | Admit: 2012-11-08 | Discharge: 2012-11-08 | Disposition: A | Discharge status: Home / Self Care | Payer: BC Managed Care – PPO | Source: Ambulatory Visit | Attending: Orthopedic Surgery | Admitting: Orthopedic Surgery

## 2012-11-08 DIAGNOSIS — M549 Dorsalgia, unspecified: Secondary | ICD-10-CM

## 2012-12-04 ENCOUNTER — Telehealth: Payer: Self-pay | Admitting: *Deleted

## 2012-12-04 NOTE — Telephone Encounter (Signed)
Pt informed with normal PTH and Calcium level on 10/28/12.

## 2013-04-04 ENCOUNTER — Other Ambulatory Visit: Payer: Self-pay | Admitting: Gynecology

## 2013-04-05 NOTE — Telephone Encounter (Signed)
Called into pharmacy

## 2013-06-15 ENCOUNTER — Other Ambulatory Visit: Payer: Self-pay | Admitting: Gynecology

## 2013-06-15 DIAGNOSIS — Z1231 Encounter for screening mammogram for malignant neoplasm of breast: Secondary | ICD-10-CM

## 2013-07-08 ENCOUNTER — Ambulatory Visit: Payer: BC Managed Care – PPO

## 2013-07-13 ENCOUNTER — Ambulatory Visit (INDEPENDENT_AMBULATORY_CARE_PROVIDER_SITE_OTHER): Payer: BC Managed Care – PPO

## 2013-07-13 DIAGNOSIS — Z1231 Encounter for screening mammogram for malignant neoplasm of breast: Secondary | ICD-10-CM

## 2013-10-04 ENCOUNTER — Other Ambulatory Visit (HOSPITAL_COMMUNITY)
Admission: RE | Admit: 2013-10-04 | Discharge: 2013-10-04 | Disposition: A | Discharge status: Home / Self Care | Payer: BC Managed Care – PPO | Source: Ambulatory Visit | Attending: Gynecology | Admitting: Gynecology

## 2013-10-04 ENCOUNTER — Ambulatory Visit (INDEPENDENT_AMBULATORY_CARE_PROVIDER_SITE_OTHER): Payer: BC Managed Care – PPO | Admitting: Gynecology

## 2013-10-04 ENCOUNTER — Encounter: Payer: Self-pay | Admitting: Gynecology

## 2013-10-04 VITALS — BP 118/74 | Ht 64.0 in | Wt 139.0 lb

## 2013-10-04 DIAGNOSIS — Z01419 Encounter for gynecological examination (general) (routine) without abnormal findings: Secondary | ICD-10-CM

## 2013-10-04 DIAGNOSIS — E229 Hyperfunction of pituitary gland, unspecified: Secondary | ICD-10-CM

## 2013-10-04 DIAGNOSIS — E221 Hyperprolactinemia: Secondary | ICD-10-CM

## 2013-10-04 NOTE — Addendum Note (Signed)
Addended by: Dayna BarkerGARDNER, Deztinee Lohmeyer K on: 10/04/2013 12:07 PM   Modules accepted: Orders

## 2013-10-04 NOTE — Progress Notes (Signed)
Hailey [name] 12/19/1960 [id number]        53 y.o.  G2P1011 for annual exam.  Several issues that are below.  Past medical history,surgical history, problem list, medications, allergies, family history and social history were all reviewed and documented as reviewed in the EPIC chart.  ROS:  12 system ROS performed with pertinent positives and negatives included in the history, assessment and plan.   Additional significant findings :  None   Exam: [facility] personassistant Filed Vitals:   10/04/13 1107  BP: 118/74  Height: 5\' 4"  (1.626 m)  Weight: 139 lb (63.05 kg)   General appearance:  Normal affect, orientation and appearance. Skin: Grossly normal HEENT: Without gross lesions.  No cervical or supraclavicular adenopathy. Thyroid normal.  Lungs:  Clear without wheezing, rales or rhonchi Cardiac: RR, without RMG Abdominal:  Soft, nontender, without masses, guarding, rebound, organomegaly or hernia Breasts:  Examined lying and sitting without masses, retractions, discharge or axillary adenopathy. Pelvic:  Ext/BUS/vagina with generalized atrophic changes. Pap of cuff done  Adnexa  Without masses or tenderness    Anus and perineum  Normal   Rectovaginal  Normal sphincter tone without palpated masses or tenderness.    Assessment/Plan:  53 y.o. 372P1011 female for annual exam.   1. Postmenopausal/atrophic genital changes. History of TAH for endometriosis. Without significant hot flushes, night sweats, vaginal dryness or dyspareunia. Will need to monitor. 2. History of hyperprolactinemia. MRI 2002 suggestion of small prolactinoma. Repeat MRI 2003 normal. Highest levels in the 80s. Prolactin 2014 12.6. Check prolactin level today. No history of galactorrhea. 3. Pap smear 2012. Pap of vaginal cuff today. Status post hysterectomy for benign indications. No history of significant abnormal Pap smears. Options to stop screening altogether or less frequent screening intervals reviewed. Will readdress on an  annual basis. 4. DEXA 2012 normal. Plan repeat at age 53. Increased calcium vitamin D reviewed. 5. Anxiety. Patient has used Xanax 0.25 mg intermittently in the past for anxiety. Has a supply at home. Will call if she needs more. No prescription today provided. 6. Colonoscopy 2013. Repeat at their recommended interval. 7. Mammography 06/2013. Continue with annual mammography. SBE monthly reviewed. 8. Health maintenance. No routine blood work done as this is being followed by Dr. Clarene DukeLittle who is following her for her cholesterol. Followup in one year, sooner as needed.   Note: This document was prepared with digital dictation and possible smart phrase technology. Any transcriptional errors that result from this process are unintentional.   Dara LordsFONTAINE,TIMOTHY P MD, 11:54 AM 10/04/2013

## 2013-10-04 NOTE — Patient Instructions (Signed)
Followup in one year for annual exam, sooner if any issues.  You may obtain a copy of any labs that were done today by logging onto MyChart as outlined in the instructions provided with your AVS (after visit summary). The office will not call with normal lab results but certainly if there are any significant abnormalities then we will contact you.   Health Maintenance, Female A healthy lifestyle and preventative care can promote health and wellness.  Maintain regular health, dental, and eye exams.  Eat a healthy diet. Foods like vegetables, fruits, whole grains, low-fat dairy products, and lean protein foods contain the nutrients you need without too many calories. Decrease your intake of foods high in solid fats, added sugars, and salt. Get information about a proper diet from your caregiver, if necessary.  Regular physical exercise is one of the most important things you can do for your health. Most adults should get at least 150 minutes of moderate-intensity exercise (any activity that increases your heart rate and causes you to sweat) each week. In addition, most adults need muscle-strengthening exercises on 2 or more days a week.   Maintain a healthy weight. The body mass index (BMI) is a screening tool to identify possible weight problems. It provides an estimate of body fat based on height and weight. Your caregiver can help determine your BMI, and can help you achieve or maintain a healthy weight. For adults 20 years and older:  A BMI below 18.5 is considered underweight.  A BMI of 18.5 to 24.9 is normal.  A BMI of 25 to 29.9 is considered overweight.  A BMI of 30 and above is considered obese.  Maintain normal blood lipids and cholesterol by exercising and minimizing your intake of saturated fat. Eat a balanced diet with plenty of fruits and vegetables. Blood tests for lipids and cholesterol should begin at age 20 and be repeated every 5 years. If your lipid or cholesterol levels are  high, you are over 50, or you are a high risk for heart disease, you may need your cholesterol levels checked more frequently.Ongoing high lipid and cholesterol levels should be treated with medicines if diet and exercise are not effective.  If you smoke, find out from your caregiver how to quit. If you do not use tobacco, do not start.  Lung cancer screening is recommended for adults aged 55 80 years who are at high risk for developing lung cancer because of a history of smoking. Yearly low-dose computed tomography (CT) is recommended for people who have at least a 30-pack-year history of smoking and are a current smoker or have quit within the past 15 years. A pack year of smoking is smoking an average of 1 pack of cigarettes a day for 1 year (for example: 1 pack a day for 30 years or 2 packs a day for 15 years). Yearly screening should continue until the smoker has stopped smoking for at least 15 years. Yearly screening should also be stopped for people who develop a health problem that would prevent them from having lung cancer treatment.  If you are pregnant, do not drink alcohol. If you are breastfeeding, be very cautious about drinking alcohol. If you are not pregnant and choose to drink alcohol, do not exceed 1 drink per day. One drink is considered to be 12 ounces (355 mL) of beer, 5 ounces (148 mL) of wine, or 1.5 ounces (44 mL) of liquor.  Avoid use of street drugs. Do not share needles with   anyone. Ask for help if you need support or instructions about stopping the use of drugs.  High blood pressure causes heart disease and increases the risk of stroke. Blood pressure should be checked at least every 1 to 2 years. Ongoing high blood pressure should be treated with medicines, if weight loss and exercise are not effective.  If you are 55 to 54 years old, ask your caregiver if you should take aspirin to prevent strokes.  Diabetes screening involves taking a blood sample to check your fasting  blood sugar level. This should be done once every 3 years, after age 45, if you are within normal weight and without risk factors for diabetes. Testing should be considered at a younger age or be carried out more frequently if you are overweight and have at least 1 risk factor for diabetes.  Breast cancer screening is essential preventative care for women. You should practice "breast self-awareness." This means understanding the normal appearance and feel of your breasts and may include breast self-examination. Any changes detected, no matter how small, should be reported to a caregiver. Women in their 20s and 30s should have a clinical breast exam (CBE) by a caregiver as part of a regular health exam every 1 to 3 years. After age 40, women should have a CBE every year. Starting at age 40, women should consider having a mammogram (breast X-ray) every year. Women who have a family history of breast cancer should talk to their caregiver about genetic screening. Women at a high risk of breast cancer should talk to their caregiver about having an MRI and a mammogram every year.  Breast cancer gene (BRCA)-related cancer risk assessment is recommended for women who have family members with BRCA-related cancers. BRCA-related cancers include breast, ovarian, tubal, and peritoneal cancers. Having family members with these cancers may be associated with an increased risk for harmful changes (mutations) in the breast cancer genes BRCA1 and BRCA2. Results of the assessment will determine the need for genetic counseling and BRCA1 and BRCA2 testing.  The Pap test is a screening test for cervical cancer. Women should have a Pap test starting at age 21. Between ages 21 and 29, Pap tests should be repeated every 2 years. Beginning at age 30, you should have a Pap test every 3 years as long as the past 3 Pap tests have been normal. If you had a hysterectomy for a problem that was not cancer or a condition that could lead to  cancer, then you no longer need Pap tests. If you are between ages 65 and 70, and you have had normal Pap tests going back 10 years, you no longer need Pap tests. If you have had past treatment for cervical cancer or a condition that could lead to cancer, you need Pap tests and screening for cancer for at least 20 years after your treatment. If Pap tests have been discontinued, risk factors (such as a new sexual partner) need to be reassessed to determine if screening should be resumed. Some women have medical problems that increase the chance of getting cervical cancer. In these cases, your caregiver may recommend more frequent screening and Pap tests.  The human papillomavirus (HPV) test is an additional test that may be used for cervical cancer screening. The HPV test looks for the virus that can cause the cell changes on the cervix. The cells collected during the Pap test can be tested for HPV. The HPV test could be used to screen women aged 30   years and older, and should be used in women of any age who have unclear Pap test results. After the age of 30, women should have HPV testing at the same frequency as a Pap test.  Colorectal cancer can be detected and often prevented. Most routine colorectal cancer screening begins at the age of 50 and continues through age 75. However, your caregiver may recommend screening at an earlier age if you have risk factors for colon cancer. On a yearly basis, your caregiver may provide home test kits to check for hidden blood in the stool. Use of a small camera at the end of a tube, to directly examine the colon (sigmoidoscopy or colonoscopy), can detect the earliest forms of colorectal cancer. Talk to your caregiver about this at age 50, when routine screening begins. Direct examination of the colon should be repeated every 5 to 10 years through age 75, unless early forms of pre-cancerous polyps or small growths are found.  Hepatitis C blood testing is recommended for  all people born from 1945 through 1965 and any individual with known risks for hepatitis C.  Practice safe sex. Use condoms and avoid high-risk sexual practices to reduce the spread of sexually transmitted infections (STIs). Sexually active women aged 25 and younger should be checked for Chlamydia, which is a common sexually transmitted infection. Older women with new or multiple partners should also be tested for Chlamydia. Testing for other STIs is recommended if you are sexually active and at increased risk.  Osteoporosis is a disease in which the bones lose minerals and strength with aging. This can result in serious bone fractures. The risk of osteoporosis can be identified using a bone density scan. Women ages 65 and over and women at risk for fractures or osteoporosis should discuss screening with their caregivers. Ask your caregiver whether you should be taking a calcium supplement or vitamin D to reduce the rate of osteoporosis.  Menopause can be associated with physical symptoms and risks. Hormone replacement therapy is available to decrease symptoms and risks. You should talk to your caregiver about whether hormone replacement therapy is right for you.  Use sunscreen. Apply sunscreen liberally and repeatedly throughout the day. You should seek shade when your shadow is shorter than you. Protect yourself by wearing long sleeves, pants, a wide-brimmed hat, and sunglasses year round, whenever you are outdoors.  Notify your caregiver of new moles or changes in moles, especially if there is a change in shape or color. Also notify your caregiver if a mole is larger than the size of a pencil eraser.  Stay current with your immunizations. Document Released: 08/20/2010 Document Revised: 06/01/2012 Document Reviewed: 08/20/2010 [employer] Patient Information 2014 [employer].   

## 2013-10-05 LAB — URINALYSIS W MICROSCOPIC + REFLEX CULTURE
Bacteria, UA: NONE SEEN
Bilirubin Urine: NEGATIVE
CASTS: NONE SEEN
CRYSTALS: NONE SEEN
Glucose, UA: NEGATIVE mg/dL
Hgb urine dipstick: NEGATIVE
KETONES UR: NEGATIVE mg/dL
LEUKOCYTES UA: NEGATIVE
Nitrite: NEGATIVE
PH: 7 (ref 5.0–8.0)
Protein, ur: NEGATIVE mg/dL
Squamous Epithelial / LPF: NONE SEEN
Urobilinogen, UA: 0.2 mg/dL (ref 0.0–1.0)

## 2013-10-05 LAB — PROLACTIN: Prolactin: 7.7 ng/mL

## 2013-10-06 LAB — CYTOLOGY - PAP

## 2013-12-20 ENCOUNTER — Encounter: Payer: Self-pay | Admitting: Gynecology

## 2014-06-06 ENCOUNTER — Other Ambulatory Visit: Payer: Self-pay | Admitting: Gynecology

## 2014-06-06 DIAGNOSIS — Z1231 Encounter for screening mammogram for malignant neoplasm of breast: Secondary | ICD-10-CM

## 2014-07-21 ENCOUNTER — Ambulatory Visit (INDEPENDENT_AMBULATORY_CARE_PROVIDER_SITE_OTHER): Payer: BC Managed Care – PPO

## 2014-07-21 DIAGNOSIS — Z1231 Encounter for screening mammogram for malignant neoplasm of breast: Secondary | ICD-10-CM | POA: Diagnosis not present

## 2014-09-22 ENCOUNTER — Other Ambulatory Visit: Payer: Self-pay | Admitting: Gynecology

## 2014-09-23 NOTE — Telephone Encounter (Signed)
Rx called in to pharmacy. 

## 2014-09-23 NOTE — Telephone Encounter (Signed)
CE scheduled for 10/06/14 with you.

## 2014-10-06 ENCOUNTER — Encounter: Payer: Self-pay | Admitting: Gynecology

## 2014-10-06 ENCOUNTER — Ambulatory Visit (INDEPENDENT_AMBULATORY_CARE_PROVIDER_SITE_OTHER): Payer: BC Managed Care – PPO | Admitting: Gynecology

## 2014-10-06 VITALS — BP 115/73 | Ht 64.0 in | Wt 142.2 lb

## 2014-10-06 DIAGNOSIS — Z01419 Encounter for gynecological examination (general) (routine) without abnormal findings: Secondary | ICD-10-CM

## 2014-10-06 NOTE — Progress Notes (Signed)
Hailey [name] 06/27/1960 [id number]        54 y.o.  G2P1011 for annual exam.  Doing well.  Past medical history,surgical history, problem list, medications, allergies, family history and social history were all reviewed and documented as reviewed in the EPIC chart.  ROS:  Performed with pertinent positives and negatives included in the history, assessment and plan.   Additional significant findings :  none   Exam: Architect Vitals:   10/06/14 0915  BP: 115/73  Height:  (1.626 m)  Weight: 142 lb 3.2 oz (64.501 kg)   General appearance:  Normal affect, orientation and appearance. Skin: Grossly normal HEENT: Without gross lesions.  No cervical or supraclavicular adenopathy. Thyroid normal.  Lungs:  Clear without wheezing, rales or rhonchi Cardiac: RR, without RMG Abdominal:  Soft, nontender, without masses, guarding, rebound, organomegaly or hernia Breasts:  Examined lying and sitting without masses, retractions, discharge or axillary adenopathy. Pelvic:  Ext/BUS/vagina normal with mild atrophic changes  Adnexa  Without masses or tenderness    Anus and perineum  Normal   Rectovaginal  Normal sphincter tone without palpated masses or tenderness.    Assessment/Plan:  54 y.o. G28P1011 female for annual exam.   1. Postmenopausal/atrophic genital changes.  Status post TAH for endometriosis. Doing well without significant hot flushes, night sweats or vaginal dryness. Continue to monitor and report any issues. 2. History of hyperprolactinemia. MRI 2002 suggested small adenoma. Follow up MRI 2003 was normal. Prolactin last year 7. Has been normal over the last several years. Discussed whether to repeat now or just to follow when she is comfortable with just following. No black to re-or other symptoms. 3. DEXA 2012 normal. Repeated age 79. Increased calcium vitamin D reviewed. 4. Anxiety. Patient uses Xanax 0.25 mg intermittently and does well with this. Has supply at home  and will call if she needs more. 5. Pap smear 2015. No Pap smear done today. No history of significant abnormal Pap smears. Options to stop screening altogether she is status post hysterectomy for benign indications versus less frequent screening intervals. We'll readdress on an annual basis. 6. Colonoscopy 2013. Repeat at their recommended interval. 7. Mammography 07/2014. Continue with annual mammography. SBE monthly reviewed. 8. Health maintenance. No routine lab work done as she has this done at Dr. Fredirick Maudlin office. Follow up 1 year, sooner as needed.   Dara Lords MD, 9:39 AM 10/06/2014

## 2014-10-06 NOTE — Patient Instructions (Signed)

## 2014-11-29 ENCOUNTER — Other Ambulatory Visit: Payer: Self-pay | Admitting: Gynecology

## 2014-11-29 NOTE — Telephone Encounter (Signed)
Called into pharmacy

## 2015-06-20 ENCOUNTER — Other Ambulatory Visit: Payer: Self-pay | Admitting: Gynecology

## 2015-06-20 DIAGNOSIS — Z139 Encounter for screening, unspecified: Secondary | ICD-10-CM

## 2015-07-26 ENCOUNTER — Ambulatory Visit (INDEPENDENT_AMBULATORY_CARE_PROVIDER_SITE_OTHER): Payer: BC Managed Care – PPO

## 2015-07-26 DIAGNOSIS — Z1231 Encounter for screening mammogram for malignant neoplasm of breast: Secondary | ICD-10-CM | POA: Diagnosis not present

## 2015-07-26 DIAGNOSIS — Z139 Encounter for screening, unspecified: Secondary | ICD-10-CM

## 2015-10-12 ENCOUNTER — Encounter: Payer: BC Managed Care – PPO | Admitting: Gynecology

## 2015-10-16 ENCOUNTER — Ambulatory Visit (INDEPENDENT_AMBULATORY_CARE_PROVIDER_SITE_OTHER): Payer: BC Managed Care – PPO | Admitting: Gynecology

## 2015-10-16 ENCOUNTER — Encounter: Payer: Self-pay | Admitting: Gynecology

## 2015-10-16 VITALS — BP 116/76 | Ht 64.0 in | Wt 141.0 lb

## 2015-10-16 DIAGNOSIS — N952 Postmenopausal atrophic vaginitis: Secondary | ICD-10-CM

## 2015-10-16 DIAGNOSIS — Z01419 Encounter for gynecological examination (general) (routine) without abnormal findings: Secondary | ICD-10-CM

## 2015-10-16 MED ORDER — ALPRAZOLAM 0.25 MG PO TABS: ORAL_TABLET | ORAL | 3 refills | Status: DC

## 2015-10-16 NOTE — Patient Instructions (Signed)

## 2015-10-16 NOTE — Progress Notes (Signed)
    Hailey [name] 05/21/1960 [id number]        55 y.o.  G2P1011  for annual exam.  Doing well without complaints  Past medical history,surgical history, problem list, medications, allergies, family history and social history were all reviewed and documented as reviewed in the EPIC chart.  ROS:  Performed with pertinent positives and negatives included in the history, assessment and plan.   Additional significant findings :   Kennon PortelaKim Gardner   Exam:  Kennon PortelaKim Gardner assistant Vitals:   10/16/15 0900  BP: 116/76  Weight: 141 lb (64 kg)  Height: 5\' 4"  (1.626 m)   Body mass index is 24.2 kg/m.  General appearance:  Normal affect, orientation and appearance. Skin: Grossly normal HEENT: Without gross lesions.  No cervical or supraclavicular adenopathy. Thyroid normal.  Lungs:  Clear without wheezing, rales or rhonchi Cardiac: RR, without RMG Abdominal:  Soft, nontender, without masses, guarding, rebound, organomegaly or hernia Breasts:  Examined lying and sitting without masses, retractions, discharge or axillary adenopathy. Pelvic:  Ext/BUS/Vagina  with mild atrophic changes  Adnexa without masses or tenderness    Anus and perineum normal   Rectovaginal normal sphincter tone without palpated masses or tenderness.    Assessment/Plan:  [age] y.o. 272P1011 female for annual exam.   1. Postmenopausal/mild atrophic changes. Status post TVH for endometriosis. Doing well without significant hot flushes, night sweats, vaginal dryness. Continue monitor and report any issues.  2. History of hyperprolactinemia. MRI 2002 suggested small adenoma. Follow up MRI 2003 normal. subsequent prolactins were normal with last prolactin 2015 of 7. We discussed issues of continues screening versus monitoring clinically. no galactorrhea , headaches or visual changes. Patient's comfortable doing nothing at this point but monitoring and reporting any symptoms.  3. DEXA 2012 normal. Plan repeat at age 55. vitamin  D/calcium supplementation discussed. 4. Anxiety. uses Xanax 0.25 mg intermittently. #30 with 3 refills provided.  5. Colonoscopy 2013. Repeat at their recommended interval.  6. Pap smear 2015. No Pap smear done today. No history of significant abnormal Pap smears. Options to stop screening versus less frequent screening intervals reviewed. reassess at next annual exam.  7. Mammography  07/2015. Continue with annual mammography when due. SBE monthly reviewed. 8. Health maintenance. No routine lab work done as patient reports is done elsewhere. Follow up 1 year, sooner as needed.   Dara LordsFONTAINE,Janai Maudlin P MD, 9:29 AM 10/16/2015

## 2016-04-12 ENCOUNTER — Ambulatory Visit (INDEPENDENT_AMBULATORY_CARE_PROVIDER_SITE_OTHER): Payer: BC Managed Care – PPO | Admitting: Neurology

## 2016-04-12 ENCOUNTER — Encounter: Payer: Self-pay | Admitting: Neurology

## 2016-04-12 ENCOUNTER — Telehealth: Payer: Self-pay | Admitting: Neurology

## 2016-04-12 VITALS — BP 126/60 | HR 74 | Resp 20 | Ht 64.0 in | Wt 140.0 lb

## 2016-04-12 DIAGNOSIS — M79641 Pain in right hand: Secondary | ICD-10-CM

## 2016-04-12 DIAGNOSIS — G5601 Carpal tunnel syndrome, right upper limb: Secondary | ICD-10-CM | POA: Diagnosis not present

## 2016-04-12 NOTE — Progress Notes (Signed)
[facility]    Provider:  Dr Lucia Gaskins Referring Provider: Catha Gosselin, MD Primary Care Physician:  Mickie Hillier, MD  CC:  Numbness in the hand  HPI:  Hailey [name] is a 56 y.o. left-handed female here as a referral from Dr. Clarene Duke for tingling in the right hand. Past medical history of chronic left-sided back pain, hyperlipidemia, insomnia. On January 13th she noticed sensory changes in the right hand. She was drinking a cup of coffee and noticed it in digit 4 then noticed it in digits 2-3, 4th being more prominent, numbness. Her mother died with ALS. She saw Dr. Clarene Duke and Phalen's maneuver was negative. She has been wearing a splint which possibly helped a little but not much. She feels her right dig 2-4 going numb especially with use. Notices it reading the newspaper as well. 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 No neck pain or radicular symptoms. She did have some neck pain after sleeping on her neck wrong on December 23rd which resolved. It was severe with decreased ROM but now resolved and not coincident with the right hand pain. She was waking with arm numbness before the wrist brace, now it is better. No other focal neurologic deficits, associated symptoms, inciting events or modifiable factors.She has had low back pain in the past as well.   Reviewed notes, labs and imaging from outside physicians, which showed:   Personally reviewed MRI of the lumbar spine images which showed very minimal degenerative changes without neural impingement.  Vitamin D 61 normal  Review of Systems: Patient complains of symptoms per HPI as well as the following symptoms: Snoring, weakness. Pertinent negatives per HPI. All others negative.   Social History   Social History  . Marital status: Married    Spouse name: N/A  . Number of children: N/A  . Years of education: N/A   Occupational History  . Not on file.    Social History Main Topics  . Smoking status: Never Smoker  . Smokeless tobacco: Never Used  . Alcohol use Yes     Comment: Rare  . Drug use: No  . Sexual activity: Yes    Birth control/ protection: Surgical     Comment: HYST-1st intercourse 68 yo-5 partners   Other Topics Concern  . Not on file   Social History Narrative  . No narrative on file    Family History  Problem Relation Age of Onset  . Breast cancer Mother 40  . ALS Mother   . Cancer Father     PAROTID GLAND  . Hypertension Father   . Breast cancer Maternal Aunt 71  . Heart failure Maternal Aunt   . Cancer Maternal Aunt     Lymphoma  . Heart disease Maternal Grandmother   . Heart disease Paternal Grandmother   . Heart failure Maternal Uncle     Past Medical History:  Diagnosis Date  . Endometriosis   . GERD (gastroesophageal reflux disease)   . Hyperprolactinemia (HCC)    MRI 1992, 2001 AND 2003. LAST MRI NORMAL.  Marland Kitchen Psoriasis     Past Surgical History:  Procedure Laterality Date  . BUNIONECTOMY    . DILATION AND CURETTAGE OF UTERUS  2002   AT TIME OF HYSTEROSCOPY  . FOOT SURGERY     spurs  . HYSTEROSCOPY  2002   AT TIME OF DIAG LAP A HYSTEROSCOPY, D&C WAS DONE  . IT band  surg    . MOUTH SURGERY    . PELVIC LAPAROSCOPY  2002   DIAG LAP W LASER DONE WITH HYSTEROSCOPY,D&C  . TOTAL ABDOMINAL HYSTERECTOMY  01/11/02    Current Outpatient Prescriptions  Medication Sig Dispense Refill  . ALPRAZolam (XANAX) 0.25 MG tablet TAKE 1 TABLET BY MOUTH EVERY 8 HOURS AS NEEDED ANXIETY. 30 tablet 3  . APPLE CIDER VINEGAR PO Take by mouth.    . Apremilast (OTEZLA) 30 MG TABS Take by mouth.    . Ascorbic Acid (VITAMIN C PO) Take by mouth.      Marland Kitchen. aspirin 81 MG tablet Take 81 mg by mouth daily.      . B Complex Vitamins (VITAMIN-B COMPLEX PO) Take by mouth.      . Calcium Carbonate-Vitamin D (CALCIUM + D PO) Take by mouth.      . cetirizine (ZYRTEC) 10 MG tablet Take 10 mg by mouth daily.    .  Cholecalciferol (VITAMIN D PO) Take by mouth.    Marland Kitchen. CINNAMON PO Take by mouth.    . Cranberry 500 MG TABS Take by mouth.    . Cyanocobalamin (VITAMIN B12 PO) Take by mouth.      . cycloSPORINE (RESTASIS) 0.05 % ophthalmic emulsion 1 drop 2 (two) times daily.    Marland Kitchen. gabapentin (NEURONTIN) 300 MG capsule Take 300 mg by mouth 3 (three) times daily.    . Multiple Vitamins-Iron (MULTIVITAMIN/IRON PO) Take by mouth.      . Omega-3 Fatty Acids (FISH OIL PO) Take by mouth.      Marland Kitchen. POTASSIUM PO Take by mouth.      . Red Yeast Rice Extract (RED YEAST RICE PO) Take by mouth.    Marland Kitchen. VITAMIN E PO Take by mouth.       No current facility-administered medications for this visit.     Allergies as of 04/12/2016 - Review Complete 04/12/2016  Allergen Reaction Noted  . Codeine  12/23/2008    Vitals: BP 126/60   Pulse 74   Resp 20   Ht 5\' 4"  (1.626 m)   Wt 63.5 kg (140 lb)   LMP 01/11/2002   BMI 24.03 kg/m  Last Weight:  Wt Readings from Last 1 Encounters:  04/12/16 63.5 kg (140 lb)   Last Height:   Ht Readings from Last 1 Encounters:  04/12/16 5\' 4"  (1.626 m)   Physical exam: Exam: Gen: NAD, conversant, well nourised, well groomed                     CV: RRR, no MRG. No Carotid Bruits. No peripheral edema, warm, nontender Eyes: Conjunctivae clear without exudates or hemorrhage  Neuro: Detailed Neurologic Exam  Speech:    Speech is normal; fluent and spontaneous with normal comprehension.  Cognition:    The patient is oriented to person, place, and time;     recent and remote memory intact;     language fluent;     normal attention, concentration,     fund of knowledge Cranial Nerves:    The pupils are equal, round, and reactive to light. The fundi are normal and spontaneous venous pulsations are present. Visual fields are full to finger confrontation. Extraocular movements are intact. Trigeminal sensation is intact and the muscles of mastication are normal. The face is symmetric. The  palate elevates in the midline. Hearing intact. Voice is normal. Shoulder shrug is normal. The tongue has normal motion without fasciculations.   Coordination:  Normal finger to nose and heel to shin. Normal rapid alternating movements.   Gait:    Heel-toe and tandem gait are normal.   Motor Observation:    No asymmetry, no atrophy, and no involuntary movements noted. Tone:    Normal muscle tone.    Posture:    Posture is normal. normal erect    Strength:    Strength is V/V in the upper and lower limbs.      Sensation: intact to LT     Reflex Exam:  DTR's:    Deep tendon reflexes in the upper and lower extremities are normal bilaterally.   Toes:    The toes are downgoing bilaterally.   Clonus:    Clonus is absent.       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 [place], [place] [address]  Phone [phone] Fax [phone]

## 2016-04-12 NOTE — Telephone Encounter (Signed)
Next available emg is April 12th . Pt would prefer Monday but can do a Thurs. Avoid the week of 04/29/16 due to spouse having sx.

## 2016-04-12 NOTE — Patient Instructions (Addendum)
Remember to drink plenty of fluid, eat healthy meals and do not skip any meals. Try to eat protein with a every meal and eat a healthy snack such as fruit or nuts in between meals. Try to keep a regular sleep-wake schedule and try to exercise daily, particularly in the form of walking, 20-30 minutes a day, if you can.   As far as diagnostic testing: emg/ncs  I would like to see you back for emg/ncs, sooner if we need to. Please call us with any interim questions, concerns, problems, updates or refill requests.   Our phone number is [phone]. We also have an after hours call service for urgent matters and there is a physician on-call for urgent questions. For any emergencies you know to call 911 or go to the nearest emergency room   Carpal Tunnel Syndrome Carpal tunnel syndrome is a condition that causes pain in your hand and arm. The carpal tunnel is a narrow area located on the palm side of your wrist. Repeated wrist motion or certain diseases may cause swelling within the tunnel. This swelling pinches the main nerve in the wrist (median nerve). What are the causes? This condition may be caused by:  Repeated wrist motions.  Wrist injuries.  Arthritis.  A cyst or tumor in the carpal tunnel.  Fluid buildup during pregnancy. Sometimes the cause of this condition is not known. What increases the risk? This condition is more likely to develop in:  People who have jobs that cause them to repeatedly move their wrists in the same motion, such as butchers and cashiers.  Women.  People with certain conditions, such as:  Diabetes.  Obesity.  An underactive thyroid (hypothyroidism).  Kidney failure. What are the signs or symptoms? Symptoms of this condition include:  A tingling feeling in your fingers, especially in your thumb, index, and middle fingers.  Tingling or numbness in your hand.  An aching feeling in your entire arm, especially when your wrist and elbow are bent for  long periods of time.  Wrist pain that goes up your arm to your shoulder.  Pain that goes down into your palm or fingers.  A weak feeling in your hands. You may have trouble grabbing and holding items. Your symptoms may feel worse during the night. How is this diagnosed? This condition is diagnosed with a medical history and physical exam. You may also have tests, including:  An electromyogram (EMG). This test measures electrical signals sent by your nerves into the muscles.  X-rays. How is this treated? Treatment for this condition includes:  Lifestyle changes. It is important to stop doing or modify the activity that caused your condition.  Physical or occupational therapy.  Medicines for pain and inflammation. This may include medicine that is injected into your wrist.  A wrist splint.  Surgery. Follow these instructions at home: If you have a splint:   Wear it as told by your health care provider. Remove it only as told by your health care provider.  Loosen the splint if your fingers become numb and tingle, or if they turn cold and blue.  Keep the splint clean and dry. General instructions   Take over-the-counter and prescription medicines only as told by your health care provider.  Rest your wrist from any activity that may be causing your pain. If your condition is work related, talk to your employer about changes that can be made, such as getting a wrist pad to use while typing.  If directed, apply   ice to the painful area:  Put ice in a plastic bag.  Place a towel between your skin and the bag.  Leave the ice on for 20 minutes, 2-3 times per day.  Keep all follow-up visits as told by your health care provider. This is important.  Do any exercises as told by your health care provider, physical therapist, or occupational therapist. Contact a health care provider if:  You have new symptoms.  Your pain is not controlled with medicines.  Your symptoms get  worse. This information is not intended to replace advice given to you by your health care provider. Make sure you discuss any questions you have with your health care provider. Document Released: 02/02/2000 Document Revised: 06/15/2015 Document Reviewed: 06/22/2014 Elsevier Interactive Patient Education  2017 Elsevier Inc.  

## 2016-04-14 ENCOUNTER — Telehealth: Payer: Self-pay | Admitting: Neurology

## 2016-04-14 DIAGNOSIS — M79641 Pain in right hand: Secondary | ICD-10-CM | POA: Insufficient documentation

## 2016-04-14 NOTE — Telephone Encounter (Signed)
Hailey [name], we need to find this patient a sooner emg/ncs slot and call her to reschedule. Please see me in the morning thank you

## 2016-04-14 NOTE — Telephone Encounter (Signed)
Candise BowensJen, lets look at the schedule and try to find her a sooner emg/ncs thank you!

## 2016-04-15 NOTE — Telephone Encounter (Signed)
Pt worked-in on Thurs 3/29 w/ 12:30 arrival time.

## 2016-04-17 NOTE — Telephone Encounter (Signed)
I called an left a message for patient we can do it next Wednesday NCS at 1115 and EMG at noon. I guess she would have to be here by 11. I left a message for her ans asked her to call back fyi

## 2016-04-18 ENCOUNTER — Telehealth: Payer: Self-pay

## 2016-04-18 NOTE — Telephone Encounter (Signed)
Returned pt call. Placed on schedule next Wed 3/7 for NCV/EMG w/ 11 am arrival time.

## 2016-04-18 NOTE — Telephone Encounter (Addendum)
This patient received phone message from dr ahern telling her to reschedule her appointment emg/ncv to 03/07 however there is no place ncv can you call and schedule pt .  Please call patient today at [phone]

## 2016-04-24 ENCOUNTER — Ambulatory Visit (INDEPENDENT_AMBULATORY_CARE_PROVIDER_SITE_OTHER): Payer: Self-pay | Admitting: Neurology

## 2016-04-24 ENCOUNTER — Ambulatory Visit (INDEPENDENT_AMBULATORY_CARE_PROVIDER_SITE_OTHER): Payer: BC Managed Care – PPO | Admitting: Neurology

## 2016-04-24 DIAGNOSIS — G5603 Carpal tunnel syndrome, bilateral upper limbs: Secondary | ICD-10-CM

## 2016-04-24 DIAGNOSIS — G5601 Carpal tunnel syndrome, right upper limb: Secondary | ICD-10-CM | POA: Diagnosis not present

## 2016-04-24 DIAGNOSIS — Z0289 Encounter for other administrative examinations: Secondary | ICD-10-CM

## 2016-04-24 NOTE — Progress Notes (Signed)
Full Name: [name] Gender: Female MRN #: [id number] Date of Birth: 1960-08-07    Visit Date: 04/24/2016 11:34 Age: 56 Years 5 Months Old Examining Physician: Naomie Dean, MD  Referring Physician: Dr. Clarene Duke   History: CHRYSTLE MURILLO is a 56 y.o. left-handed female here as a referral from Dr. Clarene Duke for tingling in the right hand. Past medical history of chronic left-sided back pain, hyperlipidemia, insomnia. On January 13th she noticed sensory changes in the right hand. She was drinking a cup of coffee and noticed it in digit 4 then noticed it in digits 2-3, 4th being more prominent, numbness. She feels her right dig 2-4 going numb especially with use. Notices it reading the newspaper as well. 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 No neck pain or radicular symptoms. She was waking with arm numbness before the wrist brace, now it is better. No other focal neurologic deficits, associated symptoms, inciting events or modifiable factors.  Summary: Nerve Conduction Studies were performed on the bilateral upper extremities.  The right Median 2nd Digit orthodromic sensory nerve showed prolonged distal peak latency (4.1 ms, N<3.4) and reduced amplitude (6uV, N>10) The Right Median 3rd Digit antidromic sensory nerve showed prolonged distal peak latency (4.5 ms, N<3.6) and reduced amplitude (3uV, N>15)  The right median/ulnar (palm) comparison nerve showed prolonged distal peak latency (Median Palm, 3.4 ms, N<2.2) and abnormal peak latency difference (Median Palm-Ulnar Palm, 1.5 ms, N<0.4) with a relative median delay.   The left median/ulnar (palm) comparison nerve showed prolonged distal peak latency (Median Palm, 2.4 ms, N<2.2) and abnormal peak latency difference (Median Palm-Ulnar Palm, 0.6 ms, N<0.4) with a relative median delay.   The muscles and remaining nerves (as indicated in the following tables) were within normal limits.     Conclusion:  There is electrophysiologic evidence of mildright > left Carpal Tunnel Syndrome. No suggestion of polyneuropathy  or radiculopathy.   Naomie Dean, MD [facility] [address] [place], [place] [address] Tel: [phone] Fax: [phone]        [facility]    Nerve / Sites Rec. Site Peak Lat Ref.  Amp Ref. Segments Distance Peak Diff Ref.    ms ms V V  cm ms ms  R Median - Orthodromic (Dig II, Mid palm)     Dig II Wrist 4.1 ?3.4 6 ?10 Dig II - Wrist 13    R Median, Ulnar - Transcarpal comparison     Median Palm Wrist 3.4 ?2.2 6 ?50 Median Palm - Wrist 8       Ulnar Palm Wrist 1.9 ?2.2 57 ?12 Ulnar Palm - Wrist 8          Median Palm - Ulnar Palm  1.5 ?0.4  R Ulnar - Orthodromic, (Dig V, Mid palm)     Dig V Wrist 2.5 ?3.1 14 ?5 Dig V - Wrist 11    R Median - Digit III (Antidromic)     Wrist Dig III 4.5 ?3.6 3 ?15 Wrist - Dig III 14    L Median - Orthodromic (Dig II, Mid palm)     Dig II Wrist 3.2 ?3.4 10 ?10 Dig II - Wrist 13    L Median, Ulnar - Transcarpal comparison     Median Palm Wrist 2.4 ?2.2 33 ?50 Median Palm - Wrist 8       Ulnar Palm Wrist 1.8 ?2.2 17 ?12  Ulnar Palm - Wrist 8          Median Palm - Ulnar Palm  0.6 ?0.4                 [facility]    Nerve / Sites Muscle Latency Ref. Amplitude Ref. Rel Amp Segments Distance Lat Diff Velocity Ref. Area    ms ms mV mV %  cm ms m/s m/s mVms  R Median - APB     Wrist APB 4.1 ?4.4 4.7 ?4.0 100 Wrist - APB 7    16.5     Upper arm APB 8.6  4.5  96.9 Upper arm - Wrist 22 4.5 49  16.0  R Ulnar - ADM     Wrist ADM 2.3 ?3.3 8.0 ?6.0 100 Wrist - ADM 7    27.3     B.Elbow ADM 5.2  7.7  96.7 B.Elbow - Wrist 17 2.8 60 ?49 22.0     A.Elbow ADM 6.7  7.4  95.5 A.Elbow - B.Elbow 9 1.6 58 ?49 21.4         A.Elbow - Wrist  4.4     L Median - APB     Wrist APB 3.4 ?4.4 4.5 ?4.0 100 Wrist - APB 7    11.5     Upper arm APB 7.2  4.3  96.2 Upper arm - Wrist 21 3.8 55  11.8           F  Wave    Nerve F Lat  Ref.   ms ms  R Ulnar - ADM 24.8 ?32.0       EMG full       EMG Summary Table    Spontaneous MUAP Recruitment  Muscle IA Fib PSW Fasc Other Amp Dur. Poly Pattern  R. Biceps brachii Normal None None None _______ Normal Normal Normal Normal  R. Cervical paraspinals Normal None None None _______ Normal Normal Normal Normal  R. Deltoid Normal None None None _______ Normal Normal Normal Normal  R. Pronator teres Normal None None None _______ Normal Normal Normal Normal  R. First dorsal interosseous Normal None None None _______ Normal Normal Normal Normal  R. Triceps brachii Normal None None None _______ Normal Normal Normal Normal  R. Opponens pollicis Normal None None None _______ Normal Normal Normal Normal

## 2016-04-24 NOTE — Progress Notes (Signed)
See procedure note.

## 2016-04-24 NOTE — Procedures (Signed)
Full Name: Hailey [name] Gender: Female MRN #: [id number] Date of Birth: 1960-08-07    Visit Date: 04/24/2016 11:34 Age: 56 Years 5 Months Old Examining Physician: Hailey Dean, MD  Referring Physician: Dr. Clarene [name]   History: Hailey [name] is a 56 y.o. left-handed female here as a referral from Dr. Clarene [name] for tingling in the right hand. Past medical history of chronic left-sided back pain, hyperlipidemia, insomnia. On January 13th she noticed sensory changes in the right hand. She was drinking a cup of coffee and noticed it in digit 4 then noticed it in digits 2-3, 4th being more prominent, numbness. She feels her right dig 2-4 going numb especially with use. Notices it reading the newspaper as well. 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 No neck pain or radicular symptoms. She was waking with arm numbness before the wrist brace, now it is better. No other focal neurologic deficits, associated symptoms, inciting events or modifiable factors.  Summary: Nerve Conduction Studies were performed on the bilateral upper extremities.  The right Median 2nd Digit orthodromic sensory nerve showed prolonged distal peak latency (4.1 ms, N<3.4) and reduced amplitude (6uV, N>10) The Right Median 3rd Digit antidromic sensory nerve showed prolonged distal peak latency (4.5 ms, N<3.6) and reduced amplitude (3uV, N>15)  The right median/ulnar (palm) comparison nerve showed prolonged distal peak latency (Median Palm, 3.4 ms, N<2.2) and abnormal peak latency difference (Median Palm-Ulnar Palm, 1.5 ms, N<0.4) with a relative median delay.   The left median/ulnar (palm) comparison nerve showed prolonged distal peak latency (Median Palm, 2.4 ms, N<2.2) and abnormal peak latency difference (Median Palm-Ulnar Palm, 0.6 ms, N<0.4) with a relative median delay.   The muscles and remaining nerves (as indicated in the following tables) were within normal limits.     Conclusion:  There is electrophysiologic evidence of mildright > left Carpal Tunnel Syndrome. No suggestion of polyneuropathy  or radiculopathy.   Hailey Dean, MD [facility] [address] [place], [place] [address] Tel: [phone] Fax: [phone]        [facility]    Nerve / Sites Rec. Site Peak Lat Ref.  Amp Ref. Segments Distance Peak Diff Ref.    ms ms V V  cm ms ms  R Median - Orthodromic (Dig II, Mid palm)     Dig II Wrist 4.1 ?3.4 6 ?10 Dig II - Wrist 13    R Median, Ulnar - Transcarpal comparison     Median Palm Wrist 3.4 ?2.2 6 ?50 Median Palm - Wrist 8       Ulnar Palm Wrist 1.9 ?2.2 57 ?12 Ulnar Palm - Wrist 8          Median Palm - Ulnar Palm  1.5 ?0.4  R Ulnar - Orthodromic, (Dig V, Mid palm)     Dig V Wrist 2.5 ?3.1 14 ?5 Dig V - Wrist 11    R Median - Digit III (Antidromic)     Wrist Dig III 4.5 ?3.6 3 ?15 Wrist - Dig III 14    L Median - Orthodromic (Dig II, Mid palm)     Dig II Wrist 3.2 ?3.4 10 ?10 Dig II - Wrist 13    L Median, Ulnar - Transcarpal comparison     Median Palm Wrist 2.4 ?2.2 33 ?50 Median Palm - Wrist 8       Ulnar Palm Wrist 1.8 ?2.2 17 ?12  Ulnar Palm - Wrist 8          Median Palm - Ulnar Palm  0.6 ?0.4                 [facility]    Nerve / Sites Muscle Latency Ref. Amplitude Ref. Rel Amp Segments Distance Lat Diff Velocity Ref. Area    ms ms mV mV %  cm ms m/s m/s mVms  R Median - APB     Wrist APB 4.1 ?4.4 4.7 ?4.0 100 Wrist - APB 7    16.5     Upper arm APB 8.6  4.5  96.9 Upper arm - Wrist 22 4.5 49  16.0  R Ulnar - ADM     Wrist ADM 2.3 ?3.3 8.0 ?6.0 100 Wrist - ADM 7    27.3     B.Elbow ADM 5.2  7.7  96.7 B.Elbow - Wrist 17 2.8 60 ?49 22.0     A.Elbow ADM 6.7  7.4  95.5 A.Elbow - B.Elbow 9 1.6 58 ?49 21.4         A.Elbow - Wrist  4.4     L Median - APB     Wrist APB 3.4 ?4.4 4.5 ?4.0 100 Wrist - APB 7    11.5     Upper arm APB 7.2  4.3  96.2 Upper arm - Wrist 21 3.8 55  11.8           F  Wave    Nerve F Lat  Ref.   ms ms  R Ulnar - ADM 24.8 ?32.0       EMG full       EMG Summary Table    Spontaneous MUAP Recruitment  Muscle IA Fib PSW Fasc Other Amp Dur. Poly Pattern  R. Biceps brachii Normal None None None _______ Normal Normal Normal Normal  R. Cervical paraspinals Normal None None None _______ Normal Normal Normal Normal  R. Deltoid Normal None None None _______ Normal Normal Normal Normal  R. Pronator teres Normal None None None _______ Normal Normal Normal Normal  R. First dorsal interosseous Normal None None None _______ Normal Normal Normal Normal  R. Triceps brachii Normal None None None _______ Normal Normal Normal Normal  R. Opponens pollicis Normal None None None _______ Normal Normal Normal Normal

## 2016-05-15 ENCOUNTER — Encounter: Payer: Self-pay | Admitting: Neurology

## 2016-05-16 ENCOUNTER — Telehealth: Payer: Self-pay

## 2016-05-16 ENCOUNTER — Encounter: Payer: Self-pay | Admitting: Neurology

## 2016-05-16 NOTE — Telephone Encounter (Signed)
Received faxed consult. Dr. Merlyn LotKuzma discussed nature of CTS and treatment options. Pt decided to proceed w/ splinting and injection. Notes given to Dr. Lucia GaskinsAhern for review.

## 2016-06-21 ENCOUNTER — Other Ambulatory Visit: Payer: Self-pay | Admitting: Family Medicine

## 2016-06-21 DIAGNOSIS — Z1231 Encounter for screening mammogram for malignant neoplasm of breast: Secondary | ICD-10-CM

## 2016-06-28 ENCOUNTER — Other Ambulatory Visit (INDEPENDENT_AMBULATORY_CARE_PROVIDER_SITE_OTHER): Payer: Self-pay | Admitting: Physician Assistant

## 2016-07-01 NOTE — Telephone Encounter (Signed)
Please advise 

## 2016-07-09 ENCOUNTER — Ambulatory Visit (INDEPENDENT_AMBULATORY_CARE_PROVIDER_SITE_OTHER): Payer: BC Managed Care – PPO | Admitting: Orthopaedic Surgery

## 2016-07-17 ENCOUNTER — Encounter (INDEPENDENT_AMBULATORY_CARE_PROVIDER_SITE_OTHER): Payer: Self-pay

## 2016-07-17 ENCOUNTER — Ambulatory Visit (INDEPENDENT_AMBULATORY_CARE_PROVIDER_SITE_OTHER): Payer: BC Managed Care – PPO | Admitting: Orthopaedic Surgery

## 2016-07-17 DIAGNOSIS — M7061 Trochanteric bursitis, right hip: Secondary | ICD-10-CM | POA: Diagnosis not present

## 2016-07-17 MED ORDER — DICLOFENAC SODIUM 1 % TD GEL: 2.0000 g | Freq: Four times a day (QID) | TRANSDERMAL | 3 refills | Status: DC

## 2016-07-17 MED ORDER — METHYLPREDNISOLONE ACETATE 40 MG/ML IJ SUSP
40.0000 mg | INTRAMUSCULAR | Status: AC | PRN
  Administered 2016-07-17: 40 mg via INTRA_ARTICULAR

## 2016-07-17 MED ORDER — LIDOCAINE HCL 1 % IJ SOLN
3.0000 mL | INTRAMUSCULAR | Status: AC | PRN
  Administered 2016-07-17: 3 mL

## 2016-07-17 NOTE — Progress Notes (Signed)
Office Visit Note   Patient: Hailey [name]           Date of Birth: 04/03/1960           MRN: [id number] Visit Date: 07/17/2016              Requested by: Catha GosselinLittle, Kevin, MD [address] [place] [id number] PCP: Catha GosselinLittle, Kevin, MD   Assessment & Plan: Visit Diagnoses:  1. Trochanteric bursitis, right hip     Plan: I talked in length about stretching exercises and had her demonstrate is back to me. I offered her steroid injection explained the risks and benefits of this and she tolerated the steroid injection well of the right greater trochanteric area. I did send into her pharmacy a prescription for a topical Voltaren gel to try as well. All questions were encouraged and answered. She'll follow-up as needed.  Follow-Up Instructions: Return if symptoms worsen or fail to improve.   Orders:  Orders Placed This Encounter  Procedures  . Large Joint Injection/Arthrocentesis   Meds ordered this encounter  Medications  . diclofenac sodium (VOLTAREN) 1 % GEL    Sig: Apply 2 g topically 4 (four) times daily.    Dispense:  100 g    Refill:  3      Procedures: Large Joint Inj Date/Time: 07/17/2016 9:21 AM Performed by: Kathryne HitchBLACKMAN, Emalea Mix Y Authorized by: Kathryne HitchBLACKMAN, Paula Busenbark Y   Location:  Hip Site:  R greater trochanter Ultrasound Guidance: No   Fluoroscopic Guidance: No   Arthrogram: No   Medications:  3 mL lidocaine 1 %; 40 mg methylPREDNISolone acetate 40 MG/ML     Clinical Data: No additional findings.   Subjective: No chief complaint on file. The patient comes in with chief complaint of right hip pain. She points the trochanteric areas source for pain. She has had this before. Steroid injections helped in the past as well as stretching exercises and physical therapy. She does take Neurontin on occasion but does not like to take that. She's tried over-the-counter topical medication such as Biofreeze or icy hot but nothing prescription dose was. She  denies any groin pain. It isn't irritating pain that is detrimentally affecting her mobility and activities daily living.  HPI  Review of Systems She currently denies any headache, chest pain, shortness of breath, fever, chills, nausea, vomiting.  Objective: Vital Signs: LMP 01/11/2002   Physical Exam She is alert or 3 and in no acute distress Ortho Exam Examination of her left hip shows a normal left hip. Examination right hip shows fluid range of motion with no pain in the groin. Her pain is only over the trochanteric area and IT band to direct palpation. Specialty Comments:  No specialty comments available.  Imaging: No results found. X rays independently reviewed of her pelvis and right hip in the past showed no deformities around the hip joint  PMFS History: Patient Active Problem List   Diagnosis Date Noted  . Trochanteric bursitis, right hip 07/17/2016  . Right hand pain 04/14/2016  . ACUTE PHARYNGITIS 12/23/2008  . ALLERGIC RHINITIS 12/23/2008   Past Medical History:  Diagnosis Date  . Endometriosis   . GERD (gastroesophageal reflux disease)   . Hyperprolactinemia (HCC)    MRI 1992, 2001 AND 2003. LAST MRI NORMAL.  Marland Kitchen. Psoriasis     Family History  Problem Relation Age of Onset  . Breast cancer Mother 3839  . ALS Mother   . Cancer Father  PAROTID GLAND  . Hypertension Father   . Breast cancer Maternal Aunt 71  . Heart failure Maternal Aunt   . Cancer Maternal Aunt        Lymphoma  . Heart disease Maternal Grandmother   . Heart disease Paternal Grandmother   . Heart failure Maternal Uncle     Past Surgical History:  Procedure Laterality Date  . BUNIONECTOMY    . DILATION AND CURETTAGE OF UTERUS  2002   AT TIME OF HYSTEROSCOPY  . FOOT SURGERY     spurs  . HYSTEROSCOPY  2002   AT TIME OF DIAG LAP A HYSTEROSCOPY, D&C WAS DONE  . IT band surg    . MOUTH SURGERY    . PELVIC LAPAROSCOPY  2002   DIAG LAP W LASER DONE WITH HYSTEROSCOPY,D&C  . TOTAL  ABDOMINAL HYSTERECTOMY  01/11/02   Social History   Occupational History  . Not on file.   Social History Main Topics  . Smoking status: Never Smoker  . Smokeless tobacco: Never Used  . Alcohol use Yes     Comment: Rare  . Drug use: No  . Sexual activity: Yes    Birth control/ protection: Surgical     Comment: HYST-1st intercourse 18 yo-5 partners

## 2016-07-25 ENCOUNTER — Other Ambulatory Visit (INDEPENDENT_AMBULATORY_CARE_PROVIDER_SITE_OTHER): Payer: Self-pay

## 2016-07-25 MED ORDER — DICLOFENAC SODIUM 1 % TD GEL: 2.0000 g | Freq: Four times a day (QID) | TRANSDERMAL | 3 refills | Status: DC

## 2016-07-26 ENCOUNTER — Ambulatory Visit (INDEPENDENT_AMBULATORY_CARE_PROVIDER_SITE_OTHER): Payer: BC Managed Care – PPO

## 2016-07-26 DIAGNOSIS — Z1231 Encounter for screening mammogram for malignant neoplasm of breast: Secondary | ICD-10-CM

## 2016-08-12 ENCOUNTER — Ambulatory Visit: Payer: BC Managed Care – PPO | Admitting: Nurse Practitioner

## 2016-08-22 ENCOUNTER — Other Ambulatory Visit (INDEPENDENT_AMBULATORY_CARE_PROVIDER_SITE_OTHER): Payer: Self-pay | Admitting: Physician Assistant

## 2016-08-23 NOTE — Telephone Encounter (Signed)
Please advise 

## 2016-10-16 ENCOUNTER — Ambulatory Visit (INDEPENDENT_AMBULATORY_CARE_PROVIDER_SITE_OTHER): Payer: BC Managed Care – PPO | Admitting: Gynecology

## 2016-10-16 ENCOUNTER — Encounter: Payer: Self-pay | Admitting: Gynecology

## 2016-10-16 VITALS — BP 116/76 | Ht 63.0 in | Wt 142.0 lb

## 2016-10-16 DIAGNOSIS — Z01411 Encounter for gynecological examination (general) (routine) with abnormal findings: Secondary | ICD-10-CM | POA: Diagnosis not present

## 2016-10-16 DIAGNOSIS — N952 Postmenopausal atrophic vaginitis: Secondary | ICD-10-CM | POA: Diagnosis not present

## 2016-10-16 NOTE — Progress Notes (Signed)
    Hailey [name] 03/25/1960 [id number]        56 y.o.  G2P1011 for annual exam.    Past medical history,surgical history, problem list, medications, allergies, family history and social history were all reviewed and documented as reviewed in the EPIC chart.  ROS:  Performed with pertinent positives and negatives included in the history, assessment and plan.   Additional significant findings :  None   Exam: Kennon PortelaKim Gardner assistant Vitals:   10/16/16 1021  BP: 116/76  Weight: 142 lb (64.4 kg)  Height: 5\' 3"  (1.6 m)   Body mass index is 25.15 kg/m.  General appearance:  Normal affect, orientation and appearance. Skin: Grossly normal HEENT: Without gross lesions.  No cervical or supraclavicular adenopathy. Thyroid normal.  Lungs:  Clear without wheezing, rales or rhonchi Cardiac: RR, without RMG Abdominal:  Soft, nontender, without masses, guarding, rebound, organomegaly or hernia Breasts:  Examined lying and sitting without masses, retractions, discharge or axillary adenopathy. Pelvic:  Ext, BUS, Vagina: With atrophic changes  Adnexa: Without masses or tenderness    Anus and perineum: Normal   Rectovaginal: Normal sphincter tone without palpated masses or tenderness.    Assessment/Plan:  56 y.o. 842P1011 female for annual exam.   1. Postmenopausal/atrophic genital changes. History of TVH for endometriosis. No significant hot flushes, night sweats or vaginal dryness. 2. History of hyperprolactinemia. MRI 2002 suggested small adenoma. Follow up MRI 2003 was normal. Follow up prolactins were all normal with last check 2015 of 7. We both agree to stop screening unless symptoms present. She's having no issues as far as headaches, visual changes or galactorrhea. 3. Mammography 07/2016. Continue with annual mammography when due. Breast exam normal today. 4. DEXA 2012 normal. Plan repeat DEXA at age 89. 5. Colonoscopy 2013. Repeat at their recommended interval. 6. Pap smear 2015. Pap  smear done today. No history of significant abnormal Pap smears. Options to stop screening per current screening guidelines reviewed. Will readdress on annual basis. 7. Anxiety. Uses Xanax 0.25 mg intermittently as needed. Has done well with this.  Will refill when needed. 8. Health maintenance. No routine lab work done as patient does this elsewhere. Follow up 1 year, sooner as needed.   Dara LordsFONTAINE,Derrika Ruffalo P MD, 10:59 AM 10/16/2016

## 2016-10-16 NOTE — Patient Instructions (Signed)
Followup in one year for annual exam, sooner as needed. 

## 2016-10-16 NOTE — Addendum Note (Signed)
Addended by: Dayna Barker on: 10/16/2016 11:05 AM   Modules accepted: Orders

## 2016-10-17 LAB — PAP IG W/ RFLX HPV ASCU

## 2016-11-04 ENCOUNTER — Ambulatory Visit (INDEPENDENT_AMBULATORY_CARE_PROVIDER_SITE_OTHER): Payer: BC Managed Care – PPO | Admitting: Orthopaedic Surgery

## 2016-11-04 DIAGNOSIS — G8929 Other chronic pain: Secondary | ICD-10-CM | POA: Insufficient documentation

## 2016-11-04 DIAGNOSIS — M25562 Pain in left knee: Secondary | ICD-10-CM | POA: Diagnosis not present

## 2016-11-04 DIAGNOSIS — M25561 Pain in right knee: Secondary | ICD-10-CM | POA: Diagnosis not present

## 2016-11-04 MED ORDER — METHYLPREDNISOLONE ACETATE 40 MG/ML IJ SUSP
40.0000 mg | INTRAMUSCULAR | Status: AC | PRN
Start: 2016-11-04 — End: 2016-11-04
  Administered 2016-11-04: 40 mg via INTRA_ARTICULAR

## 2016-11-04 MED ORDER — METHYLPREDNISOLONE ACETATE 40 MG/ML IJ SUSP
40.0000 mg | INTRAMUSCULAR | Status: AC | PRN
  Administered 2016-11-04: 40 mg via INTRA_ARTICULAR

## 2016-11-04 MED ORDER — LIDOCAINE HCL 1 % IJ SOLN
3.0000 mL | INTRAMUSCULAR | Status: AC | PRN
  Administered 2016-11-04: 3 mL

## 2016-11-04 MED ORDER — LIDOCAINE HCL 1 % IJ SOLN
3.0000 mL | INTRAMUSCULAR | Status: AC | PRN
Start: 2016-11-04 — End: 2016-11-04
  Administered 2016-11-04: 3 mL

## 2016-11-04 NOTE — Progress Notes (Signed)
Office Visit Note   Patient: Hailey [name]           Date of Birth: 1960-03-11           MRN: [id number] Visit Date: 11/04/2016              Requested by: Catha Gosselin, MD [address] [place], [place] [address] PCP: Catha Gosselin, MD   Assessment & Plan: Visit Diagnoses:  1. Chronic pain of left knee   2. Chronic pain of right knee     Plan: I do feel that most of her issues are her knees. I talked about trying steroid injections in her knees and she was agreeable to this. The risk and benefits of injections were discussed as well. She tolerated injections well. I like see her back in 4 weeks to see how she is doing overall. I would like an AP and lateral both knees at that visit. All questions were encouraged and answered.  Follow-Up Instructions: Return in about 4 weeks (around 12/02/2016).   Orders:  Orders Placed This Encounter  Procedures  . Large Joint Injection/Arthrocentesis  . Large Joint Injection/Arthrocentesis   No orders of the defined types were placed in this encounter.     Procedures: Large Joint Inj Date/Time: 11/04/2016 10:28 AM Performed by: Kathryne Hitch Authorized by: Doneen Poisson Y   Location:  Knee Site:  R knee Ultrasound Guidance: No   Fluoroscopic Guidance: No   Arthrogram: No   Medications:  3 mL lidocaine 1 %; 40 mg methylPREDNISolone acetate 40 MG/ML Large Joint Inj Date/Time: 11/04/2016 10:28 AM Performed by: Kathryne Hitch Authorized by: Kathryne Hitch   Location:  Knee Site:  L knee Ultrasound Guidance: No   Fluoroscopic Guidance: No   Arthrogram: No   Medications:  3 mL lidocaine 1 %; 40 mg methylPREDNISolone acetate 40 MG/ML     Clinical Data: No additional findings.   Subjective: No chief complaint on file. The patient is well-known to me. Back in May of this year we performed a trochanteric injection for bursitis of her right hip. She said that is helped a bit and she still  has some tenderness around her hip on the right side but it's her knees about her device. She says at night she has a letter left knee pain off side the bed because of pain in her shin that only occurs at night. She is someone who does water aerobics at least 2 days a week as well as other higher impact aerobic activities also a gym class and trainers. She gets grinding in both knees.  HPI  Review of Systems She currently denies any headache, chest pain, short of breath, fever, chills, nausea, vomiting.  Objective: Vital Signs: LMP 01/11/2002   Physical Exam She is alert or 3 and in no acute distress Ortho Exam Both hips have fluid range of motion. The right hip still has a little bit of pain over trochanteric area but it's only mild. Both knees have slight varus malalignment with the right worse on left. Both knees have significant patellofemoral crepitation. Both knees are ligamentously stable. Specialty Comments:  No specialty comments available.  Imaging: No results found.   PMFS History: Patient Active Problem List   Diagnosis Date Noted  . Chronic pain of left knee 11/04/2016  . Chronic pain of right knee 11/04/2016  . Trochanteric bursitis, right hip 07/17/2016  . Right hand pain 04/14/2016  . ACUTE PHARYNGITIS 12/23/2008  . ALLERGIC  RHINITIS 12/23/2008   Past Medical History:  Diagnosis Date  . Endometriosis   . GERD (gastroesophageal reflux disease)   . Hyperprolactinemia (HCC)    MRI 1992, 2001 AND 2003. LAST MRI NORMAL.  Marland Kitchen Psoriasis     Family History  Problem Relation Age of Onset  . Breast cancer Mother 62  . ALS Mother   . Cancer Father        PAROTID GLAND  . Hypertension Father   . Breast cancer Maternal Aunt 71  . Heart failure Maternal Aunt   . Cancer Maternal Aunt        Lymphoma  . Heart disease Maternal Grandmother   . Heart disease Paternal Grandmother   . Heart failure Maternal Uncle     Past Surgical History:  Procedure Laterality Date    . BUNIONECTOMY    . DILATION AND CURETTAGE OF UTERUS  2002   AT TIME OF HYSTEROSCOPY  . FOOT SURGERY     spurs  . HYSTEROSCOPY  2002   AT TIME OF DIAG LAP A HYSTEROSCOPY, D&C WAS DONE  . IT band surg    . MOUTH SURGERY    . PELVIC LAPAROSCOPY  2002   DIAG LAP W LASER DONE WITH HYSTEROSCOPY,D&C  . TOTAL ABDOMINAL HYSTERECTOMY  01/11/02   Social History   Occupational History  . Not on file.   Social History Main Topics  . Smoking status: Never Smoker  . Smokeless tobacco: Never Used  . Alcohol use Yes     Comment: Rare  . Drug use: No  . Sexual activity: Not Currently    Birth control/ protection: Surgical     Comment: HYST-1st intercourse 18 yo-5 partners

## 2016-11-09 ENCOUNTER — Other Ambulatory Visit (INDEPENDENT_AMBULATORY_CARE_PROVIDER_SITE_OTHER): Payer: Self-pay | Admitting: Physician Assistant

## 2016-11-11 NOTE — Telephone Encounter (Signed)
Please advise 

## 2016-11-14 ENCOUNTER — Other Ambulatory Visit (INDEPENDENT_AMBULATORY_CARE_PROVIDER_SITE_OTHER): Payer: Self-pay

## 2016-11-14 MED ORDER — DICLOFENAC SODIUM 2 % TD SOLN: 1.0000 | Freq: Four times a day (QID) | TRANSDERMAL | 2 refills | Status: DC | PRN

## 2016-11-14 NOTE — Progress Notes (Unsigned)
pennsaid

## 2016-12-02 ENCOUNTER — Ambulatory Visit (INDEPENDENT_AMBULATORY_CARE_PROVIDER_SITE_OTHER): Payer: BC Managed Care – PPO

## 2016-12-02 ENCOUNTER — Ambulatory Visit (INDEPENDENT_AMBULATORY_CARE_PROVIDER_SITE_OTHER): Payer: Self-pay

## 2016-12-02 ENCOUNTER — Ambulatory Visit (INDEPENDENT_AMBULATORY_CARE_PROVIDER_SITE_OTHER): Payer: BC Managed Care – PPO | Admitting: Physician Assistant

## 2016-12-02 DIAGNOSIS — G8929 Other chronic pain: Secondary | ICD-10-CM | POA: Diagnosis not present

## 2016-12-02 DIAGNOSIS — M25561 Pain in right knee: Secondary | ICD-10-CM

## 2016-12-02 DIAGNOSIS — M25562 Pain in left knee: Secondary | ICD-10-CM

## 2016-12-02 NOTE — Progress Notes (Signed)
Office Visit Note   Patient: Hailey [name]           Date of Birth: October 08, 1960           MRN: [id number] Visit Date: 12/02/2016              Requested by: Catha Gosselin, MD [address] [place], [place] [address] PCP: Catha Gosselin, MD   Assessment & Plan: Visit Diagnoses:  1. Chronic pain of both knees     Plan: We we will have her do quad strengthening exercises.she can also go on Aleve twice daily with food for the next week, start Tumeric will try to gain approval for a supplemental injection left knee.    Birth weight not on filewill try to gain approval for a supplemental injection left knee.  Follow-Up Instructions: Return for post op.   Orders:  Orders Placed This Encounter  Procedures  . XR Knee 1-2 Views Left  . XR Knee 1-2 Views Right   No orders of the defined types were placed in this encounter.     Procedures: No procedures performed   Clinical Data: No additional findings.   Subjective: Bilateral knee follow-up  HPI Sport and returns today follow-up of bilateral knee pain. She underwent cortisone injections both knees on 11/04/2016. She says that it helped her greatly with her knee pain. However she is having some left leg particularly the proximal tibia region.she denies any numbness tingling down either leg. Denies any low back pain. Pain is worse with prolonged sitting. Also with going up steps.No known injury. History of shin splints in the past when she was running.   Review of Systems Denies any numbness tingling down either leg. No bowel bladder dysfunction. No dysuria or increase urination. Denies any back pain.  Objective: Vital Signs: LMP 01/11/2002   Physical Exam  Constitutional: She is oriented to person, place, and time. She appears well-developed and well-nourished. No distress.  Neurological: She is alert and oriented to person, place, and time.  Skin: She is not diaphoretic.  Psychiatric: She has a normal mood and affect.      Ortho Exam Bilateral knees she has full range of motion. No instability valgus varus stressing. No effusion abnormal warmth erythema of either knee. Patellofemoral crepitus bilaterally left greater than right. No tenderness along medial lateral joint line of either knee. Positive Manfred Shirts exam on the left negative on the right. Negative straight leg raise bilaterally. Good range of motion bilateral hips. Tenderness over the proximal tibial shaft. No tenderness of the pes anserinus bilaterally.   Specialty Comments:  No specialty comments available.  Imaging: Xr Knee 1-2 Views Left  Result Date: 12/02/2016 Left knee 2 views: No acute fracture. Mild medial compartmental narrowing. Otherwise knee are all preserved. Left knee is well located.  Xr Knee 1-2 Views Right  Result Date: 12/02/2016  Right knee: No acute fracture. Mild medial compartmental narrowing. Otherwise knee are all preserved. Left knee is well located.     PMFS History: Patient Active Problem List   Diagnosis Date Noted  . Chronic pain of left knee 11/04/2016  . Chronic pain of right knee 11/04/2016  . Trochanteric bursitis, right hip 07/17/2016  . Right hand pain 04/14/2016  . ACUTE PHARYNGITIS 12/23/2008  . ALLERGIC RHINITIS 12/23/2008   Past Medical History:  Diagnosis Date  . Endometriosis   . GERD (gastroesophageal reflux disease)   . Hyperprolactinemia (HCC)    MRI 1992, 2001 AND 2003. LAST MRI  NORMAL.  Marland Kitchen Psoriasis     Family History  Problem Relation Age of Onset  . Breast cancer Mother 44  . ALS Mother   . Cancer Father        PAROTID GLAND  . Hypertension Father   . Breast cancer Maternal Aunt 71  . Heart failure Maternal Aunt   . Cancer Maternal Aunt        Lymphoma  . Heart disease Maternal Grandmother   . Heart disease Paternal Grandmother   . Heart failure Maternal Uncle     Past Surgical History:  Procedure Laterality Date  . BUNIONECTOMY    . DILATION AND CURETTAGE OF  UTERUS  2002   AT TIME OF HYSTEROSCOPY  . FOOT SURGERY     spurs  . HYSTEROSCOPY  2002   AT TIME OF DIAG LAP A HYSTEROSCOPY, D&C WAS DONE  . IT band surg    . MOUTH SURGERY    . PELVIC LAPAROSCOPY  2002   DIAG LAP W LASER DONE WITH HYSTEROSCOPY,D&C  . TOTAL ABDOMINAL HYSTERECTOMY  01/11/02   Social History   Occupational History  . Not on file.   Social History Main Topics  . Smoking status: Never Smoker  . Smokeless tobacco: Never Used  . Alcohol use Yes     Comment: Rare  . Drug use: No  . Sexual activity: Not Currently    Birth control/ protection: Surgical     Comment: HYST-1st intercourse 18 yo-5 partners

## 2017-01-01 ENCOUNTER — Other Ambulatory Visit (INDEPENDENT_AMBULATORY_CARE_PROVIDER_SITE_OTHER): Payer: Self-pay | Admitting: Physician Assistant

## 2017-01-02 NOTE — Telephone Encounter (Signed)
Please advise 

## 2017-05-01 ENCOUNTER — Other Ambulatory Visit (INDEPENDENT_AMBULATORY_CARE_PROVIDER_SITE_OTHER): Payer: Self-pay | Admitting: Physician Assistant

## 2017-05-02 NOTE — Telephone Encounter (Signed)
Please advise 

## 2017-05-05 NOTE — Telephone Encounter (Signed)
THIS IS NOT A MEDICATION TO TAKE PRN. HAS SHE BEEN TAKING IT THE WHOLE TIME IF SHE TAKES PRN THEN NO IF CHRONICALLY TAKING THEN OK TO REFILL

## 2017-05-05 NOTE — Telephone Encounter (Signed)
I LM for pt to call me back to discuss

## 2017-05-06 NOTE — Telephone Encounter (Signed)
I still have not heard back from patient to get response from her regarding Gil's question.

## 2017-05-07 NOTE — Telephone Encounter (Signed)
She said she takes at night

## 2017-05-07 NOTE — Telephone Encounter (Signed)
Patient called to state she has no message from this office. She has 2 calls on her vm. Please call her in regards to the refill.

## 2017-06-12 ENCOUNTER — Telehealth (INDEPENDENT_AMBULATORY_CARE_PROVIDER_SITE_OTHER): Payer: Self-pay

## 2017-06-12 ENCOUNTER — Ambulatory Visit (INDEPENDENT_AMBULATORY_CARE_PROVIDER_SITE_OTHER): Payer: BC Managed Care – PPO | Admitting: Orthopaedic Surgery

## 2017-06-12 ENCOUNTER — Encounter (INDEPENDENT_AMBULATORY_CARE_PROVIDER_SITE_OTHER): Payer: Self-pay | Admitting: Orthopaedic Surgery

## 2017-06-12 DIAGNOSIS — M1711 Unilateral primary osteoarthritis, right knee: Secondary | ICD-10-CM | POA: Diagnosis not present

## 2017-06-12 DIAGNOSIS — M1712 Unilateral primary osteoarthritis, left knee: Secondary | ICD-10-CM | POA: Diagnosis not present

## 2017-06-12 DIAGNOSIS — G8929 Other chronic pain: Secondary | ICD-10-CM | POA: Diagnosis not present

## 2017-06-12 DIAGNOSIS — M25561 Pain in right knee: Secondary | ICD-10-CM | POA: Diagnosis not present

## 2017-06-12 DIAGNOSIS — M25562 Pain in left knee: Secondary | ICD-10-CM

## 2017-06-12 MED ORDER — LIDOCAINE HCL 1 % IJ SOLN
3.0000 mL | INTRAMUSCULAR | Status: AC | PRN
  Administered 2017-06-12: 3 mL

## 2017-06-12 MED ORDER — METHYLPREDNISOLONE ACETATE 40 MG/ML IJ SUSP
40.0000 mg | INTRAMUSCULAR | Status: AC | PRN
  Administered 2017-06-12: 40 mg via INTRA_ARTICULAR

## 2017-06-12 MED ORDER — LIDOCAINE HCL 1 % IJ SOLN
3.0000 mL | INTRAMUSCULAR | Status: AC | PRN
Start: 2017-06-12 — End: 2017-06-12
  Administered 2017-06-12: 3 mL

## 2017-06-12 NOTE — Telephone Encounter (Signed)
Submitted application online for SynviscOne injection, bilateral knee. 

## 2017-06-12 NOTE — Progress Notes (Signed)
Office Visit Note   Patient: Hailey [name]           Date of Birth: [birth year]           MRN: [id number] Visit Date: 06/12/2017              Requested by: Catha Gosselin, MD [address] [place], [place] [address] PCP: Catha Gosselin, MD   Assessment & Plan: Visit Diagnoses:  1. Chronic pain of both knees     Plan: Given the mild arthritis in her knees.  I did provide steroid injections in both her knees today.  She is definitely candidate at this point for hyaluronic acid we will order hyaluronic acid injections for her next visit places in her knees.  I do feel is appropriate now this point given the failure of other conservative treatment measures.  Follow-Up Instructions: No follow-ups on file.   Orders:  No orders of the defined types were placed in this encounter.  No orders of the defined types were placed in this encounter.     Procedures: Large Joint Inj: R knee on 06/12/2017 3:25 PM Indications: diagnostic evaluation and pain Details: 22 G 1.5 in needle, superolateral approach  Arthrogram: No  Medications: 3 mL lidocaine 1 %; 40 mg methylPREDNISolone acetate 40 MG/ML Outcome: tolerated well, no immediate complications Procedure, treatment alternatives, risks and benefits explained, specific risks discussed. Consent was given by the patient. Immediately prior to procedure a time out was called to verify the correct patient, procedure, equipment, support staff and site/side marked as required. Patient was prepped and draped in the usual sterile fashion.   Large Joint Inj: L knee on 06/12/2017 3:26 PM Indications: diagnostic evaluation and pain Details: 22 G 1.5 in needle, superolateral approach  Arthrogram: No  Medications: 3 mL lidocaine 1 %; 40 mg methylPREDNISolone acetate 40 MG/ML Outcome: tolerated well, no immediate complications Procedure, treatment alternatives, risks and benefits explained, specific risks discussed. Consent was given by the patient.  Immediately prior to procedure a time out was called to verify the correct patient, procedure, equipment, support staff and site/side marked as required. Patient was prepped and draped in the usual sterile fashion.       Clinical Data: No additional findings.   Subjective: Chief Complaint  Patient presents with  . Left Knee - Pain  . Right Knee - Pain  The patient is well-known to [place].  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  Its only in the medial compartment.  She has a family medical history of osteoarthritis and does have a brother who had bilateral knee replacements.  58 months ago we placed steroid injections in her knees and now she is gotten over the knees ache again with right worse than left.  Is mainly the medial joint line.  She denies any locking catching.  She is never had hyaluronic acid injections and she is curious about these.  We have given her handouts on this before.  She would like to have steroid injections in her knees today.  She is not a diabetic.  She denies any significant swelling.  She is a very thin individual.  HPI  Review of Systems She currently denies any headache, chest pain, short of breath, fever, chills, nausea, vomiting.  Objective: Vital Signs: LMP 01/11/2057   Physical Exam She is alert and oriented x3 and in no acute distress Ortho Exam Examination of both knees show full range of motion.  Both knees are  ligamentously stable.  Both knees have slight varus malalignment is easily correctable.  Both knees have medial joint line tenderness with negative McMurray's negative Lockman's exams Specialty Comments:  No specialty comments available.  Imaging: No results found.   PMFS History: Patient Active Problem List   Diagnosis Date Noted  . Chronic pain of left knee 11/04/2016  . Chronic pain of right knee 11/04/2016  . Trochanteric bursitis, right hip 07/17/2016  . Right hand pain 04/14/2016  . ACUTE PHARYNGITIS  12/23/2008  . ALLERGIC RHINITIS 12/23/2008   Past Medical History:  Diagnosis Date  . Endometriosis   . GERD (gastroesophageal reflux disease)   . Hyperprolactinemia (HCC)    MRI 1992, 2001 AND 2003. LAST MRI NORMAL.  Marland Kitchen. Psoriasis     Family History  Problem Relation Age of Onset  . Breast cancer Mother 4139  . ALS Mother   . Cancer Father        PAROTID GLAND  . Hypertension Father   . Breast cancer Maternal Aunt 71  . Heart failure Maternal Aunt   . Cancer Maternal Aunt        Lymphoma  . Heart disease Maternal Grandmother   . Heart disease Paternal Grandmother   . Heart failure Maternal Uncle     Past Surgical History:  Procedure Laterality Date  . BUNIONECTOMY    . DILATION AND CURETTAGE OF UTERUS  2002   AT TIME OF HYSTEROSCOPY  . FOOT SURGERY     spurs  . HYSTEROSCOPY  2002   AT TIME OF DIAG LAP A HYSTEROSCOPY, D&C WAS DONE  . IT band surg    . MOUTH SURGERY    . PELVIC LAPAROSCOPY  2002   DIAG LAP W LASER DONE WITH HYSTEROSCOPY,D&C  . TOTAL ABDOMINAL HYSTERECTOMY  01/11/02   Social History   Occupational History  . Not on file  Tobacco Use  . Smoking status: Never Smoker  . Smokeless tobacco: Never Used  Substance and Sexual Activity  . Alcohol use: Yes    Comment: Rare  . Drug use: No  . Sexual activity: Not Currently    Birth control/protection: Surgical    Comment: HYST-1st intercourse 18 yo-5 partners

## 2017-06-13 ENCOUNTER — Encounter (INDEPENDENT_AMBULATORY_CARE_PROVIDER_SITE_OTHER): Payer: Self-pay | Admitting: Orthopaedic Surgery

## 2017-06-16 ENCOUNTER — Other Ambulatory Visit: Payer: Self-pay | Admitting: Gynecology

## 2017-06-16 DIAGNOSIS — Z1231 Encounter for screening mammogram for malignant neoplasm of breast: Secondary | ICD-10-CM

## 2017-07-11 ENCOUNTER — Telehealth (INDEPENDENT_AMBULATORY_CARE_PROVIDER_SITE_OTHER): Payer: Self-pay

## 2017-07-11 NOTE — Telephone Encounter (Signed)
PA required for SynviscOne injection, bilateral knee. Faxed completed PA form to [facility] at [phone] and [phone].

## 2017-07-15 ENCOUNTER — Telehealth (INDEPENDENT_AMBULATORY_CARE_PROVIDER_SITE_OTHER): Payer: Self-pay

## 2017-07-15 NOTE — Telephone Encounter (Signed)
Patient is approved for SynviscOne injection, bilateral knee through [employer]. Authorization# [id number] Valid 07/11/2017-07/10/2017  Covered at 100% after $80.00 co-pay Buy & Bill Appt.scheduled 07/17/2017

## 2017-07-17 ENCOUNTER — Ambulatory Visit (INDEPENDENT_AMBULATORY_CARE_PROVIDER_SITE_OTHER): Payer: BC Managed Care – PPO | Admitting: Orthopaedic Surgery

## 2017-07-29 ENCOUNTER — Telehealth (INDEPENDENT_AMBULATORY_CARE_PROVIDER_SITE_OTHER): Payer: Self-pay | Admitting: Orthopaedic Surgery

## 2017-07-29 NOTE — Telephone Encounter (Signed)
Patient wants to make sure her injections are still approved for her appt on Monday 6/17. Please call her cell # [phone]

## 2017-07-29 NOTE — Telephone Encounter (Signed)
april

## 2017-07-29 NOTE — Telephone Encounter (Signed)
Duplicate

## 2017-07-29 NOTE — Telephone Encounter (Signed)
Patient is good to go.  Thank You.

## 2017-07-29 NOTE — Telephone Encounter (Signed)
Talked with patient and she is approved to have SynviscOne injection, bilateral knee.  Valid dates are 07/11/2017 - 07/11/2018

## 2017-07-29 NOTE — Telephone Encounter (Signed)
Patient called asked if she is still approved to have the Synvisc One injection in both knees. The number to contact patient is [phone]

## 2017-07-29 NOTE — Telephone Encounter (Signed)
Talked with patient is she approved for SynviscOne injection, bilateral knee.

## 2017-07-29 NOTE — Telephone Encounter (Signed)
Can you let me know if she still needs to talk to me after you  Speak with her about her injection

## 2017-07-29 NOTE — Telephone Encounter (Signed)
Patient left a vm requesting a call back from you.  CB#[phone].  Thank you.

## 2017-08-01 ENCOUNTER — Ambulatory Visit (INDEPENDENT_AMBULATORY_CARE_PROVIDER_SITE_OTHER): Payer: BC Managed Care – PPO

## 2017-08-01 DIAGNOSIS — Z1231 Encounter for screening mammogram for malignant neoplasm of breast: Secondary | ICD-10-CM

## 2017-08-04 ENCOUNTER — Ambulatory Visit (INDEPENDENT_AMBULATORY_CARE_PROVIDER_SITE_OTHER): Payer: BC Managed Care – PPO | Admitting: Orthopaedic Surgery

## 2017-08-04 ENCOUNTER — Encounter (INDEPENDENT_AMBULATORY_CARE_PROVIDER_SITE_OTHER): Payer: Self-pay | Admitting: Orthopaedic Surgery

## 2017-08-04 DIAGNOSIS — M1712 Unilateral primary osteoarthritis, left knee: Secondary | ICD-10-CM | POA: Diagnosis not present

## 2017-08-04 DIAGNOSIS — M1711 Unilateral primary osteoarthritis, right knee: Secondary | ICD-10-CM

## 2017-08-04 MED ORDER — HYLAN G-F 20 48 MG/6ML IX SOSY
48.0000 mg | PREFILLED_SYRINGE | INTRA_ARTICULAR | Status: AC | PRN
  Administered 2017-08-04: 48 mg via INTRA_ARTICULAR

## 2017-08-04 NOTE — Progress Notes (Signed)
   Procedure Note  Patient: Hailey [name]             Date of Birth: 07/09/1960           MRN: [id number]             Visit Date: 08/04/2017  Procedures: Visit Diagnoses: Unilateral primary osteoarthritis, left knee  Unilateral primary osteoarthritis, right knee  Large Joint Inj: R knee on 08/04/2017 8:38 AM Indications: pain and diagnostic evaluation Details: 22 G 1.5 in needle, superolateral approach  Arthrogram: No  Medications: 48 mg Hylan 48 MG/6ML Outcome: tolerated well, no immediate complications Procedure, treatment alternatives, risks and benefits explained, specific risks discussed. Consent was given by the patient. Immediately prior to procedure a time out was called to verify the correct patient, procedure, equipment, support staff and site/side marked as required. Patient was prepped and draped in the usual sterile fashion.   Large Joint Inj: L knee on 08/04/2017 8:38 AM Indications: pain and diagnostic evaluation Details: 22 G 1.5 in needle, superolateral approach  Arthrogram: No  Medications: 48 mg Hylan 48 MG/6ML Outcome: tolerated well, no immediate complications Procedure, treatment alternatives, risks and benefits explained, specific risks discussed. Consent was given by the patient. Immediately prior to procedure a time out was called to verify the correct patient, procedure, equipment, support staff and site/side marked as required. Patient was prepped and draped in the usual sterile fashion.    The patient has known osteoarthritis of both her knees.  This is painful to her.  She is tried conservative treatment with activity modification and anti-inflammatories as well as quad strengthening exercises.  She has had bilateral knee steroid injections.  She is here today for hyaluronic acid injections with Synvisc 1 in both knees to treat osteoarthritis pain.  She understands the reasoning behind these injections as well as the risk and benefits involved.  On exam  today both knees have slight varus malalignment.  There is no effusion with excellent range of motion of both knees.  Both knees have just some mild patellofemoral crepitation and mild medial joint line tenderness.  She tolerated the Synvisc 1 injection well in both knees.  All questions concerns were answered and addressed.  Follow-up will be as needed.

## 2017-08-08 ENCOUNTER — Telehealth (INDEPENDENT_AMBULATORY_CARE_PROVIDER_SITE_OTHER): Payer: Self-pay | Admitting: Orthopaedic Surgery

## 2017-08-08 NOTE — Telephone Encounter (Signed)
08/04/2017 OV note faxed to Dr. Catha GosselinKevin Little [phone]

## 2017-10-17 ENCOUNTER — Encounter: Payer: BC Managed Care – PPO | Admitting: Gynecology

## 2017-10-22 ENCOUNTER — Ambulatory Visit: Payer: BC Managed Care – PPO | Admitting: Gynecology

## 2017-10-22 ENCOUNTER — Encounter: Payer: Self-pay | Admitting: Gynecology

## 2017-10-22 VITALS — BP 120/76 | Ht 63.0 in | Wt 139.0 lb

## 2017-10-22 DIAGNOSIS — Z01419 Encounter for gynecological examination (general) (routine) without abnormal findings: Secondary | ICD-10-CM

## 2017-10-22 DIAGNOSIS — N952 Postmenopausal atrophic vaginitis: Secondary | ICD-10-CM

## 2017-10-22 DIAGNOSIS — Z23 Encounter for immunization: Secondary | ICD-10-CM

## 2017-10-22 NOTE — Progress Notes (Signed)
    Hailey [name] [birth year] [id number]        57 y.o.  G2P1011 for annual gynecologic exam.  Doing well without complaints  Past medical history,surgical history, problem list, medications, allergies, family history and social history were all reviewed and documented as reviewed in the EPIC chart.  ROS:  Performed with pertinent positives and negatives included in the history, assessment and plan.   Additional significant findings : None   Exam: [name] assistant Vitals:   10/22/17 1527  BP: 120/76  Weight: 139 lb (63 kg)  Height: 5\' 3"  (1.6 m)   Body mass index is 24.62 kg/m.  General appearance:  Normal affect, orientation and appearance. Skin: Grossly normal HEENT: Without gross lesions.  No cervical or supraclavicular adenopathy. Thyroid normal.  Lungs:  Clear without wheezing, rales or rhonchi Cardiac: RR, without RMG Abdominal:  Soft, nontender, without masses, guarding, rebound, organomegaly or hernia Breasts:  Examined lying and sitting without masses, retractions, discharge or axillary adenopathy. Pelvic:  Ext, BUS, Vagina: With mild atrophic changes  Adnexa: Without masses or tenderness    Anus and perineum: Normal   Rectovaginal: Normal sphincter tone without palpated masses or tenderness.    Assessment/Plan:  57 y.o. G66P1011 female for annual gynecologic exam.   1. Postmenopausal.  Status post TAH for endometriosis.  Doing well without significant menopausal symptoms. 2. History of hyperprolactinemia.  MRI 2002 suggested small adenoma.  Follow-up MRI 2003 was normal.  Follow-up prolactin's had all been normal with most recent 2015 value of 7.  We have elected not to screen unless symptoms such as galactorrhea headaches or visual changes. 3. DEXA 2012 normal.  Plan repeat DEXA at age 57. 4. Colonoscopy 2013.  Repeat at their recommended interval. 5. Pap smear 2018.  No Pap smear done today.  No history of significant abnormal Pap smears.  Options to stop  screening per current screening guidelines based on hysterectomy reviewed.  Will readdress on an annual basis. 6. Mammography 07/2017.  Continue with annual mammography when due.  Breast exam normal today. 7. History of anxiety.  Had used Xanax in the past.  Does not feel she needs at this time but may call if needs more. 8. Health maintenance.  No routine lab work done as she reports this done elsewhere.  Follow-up 1 year, sooner as needed.   Dara Lords MD, 4:05 PM 10/22/2017

## 2017-10-22 NOTE — Patient Instructions (Signed)
Follow-up in 1 year, sooner if any issues 

## 2018-03-23 ENCOUNTER — Other Ambulatory Visit (INDEPENDENT_AMBULATORY_CARE_PROVIDER_SITE_OTHER): Payer: Self-pay

## 2018-03-23 ENCOUNTER — Telehealth (INDEPENDENT_AMBULATORY_CARE_PROVIDER_SITE_OTHER): Payer: Self-pay | Admitting: Orthopaedic Surgery

## 2018-03-23 MED ORDER — DICLOFENAC SODIUM 1 % TD GEL: 2.0000 g | Freq: Four times a day (QID) | TRANSDERMAL | 4 refills | Status: DC

## 2018-03-23 NOTE — Telephone Encounter (Signed)
Pennsaid is no longer covered by [employer] per [facility] in [place]. Patient wants to know what medication she can use instead that will be covered by her insurance.  Fredrika callback # [phone]

## 2018-03-23 NOTE — Telephone Encounter (Signed)
LMOM for patient per [facility] I cam sending Voltaren gel to her local pharmacy  They said copay after PA may be about $30 so we are going to try this

## 2018-03-24 ENCOUNTER — Other Ambulatory Visit (INDEPENDENT_AMBULATORY_CARE_PROVIDER_SITE_OTHER): Payer: Self-pay | Admitting: Physician Assistant

## 2018-03-24 NOTE — Telephone Encounter (Signed)
Please advise 

## 2018-04-06 ENCOUNTER — Ambulatory Visit (INDEPENDENT_AMBULATORY_CARE_PROVIDER_SITE_OTHER): Payer: BC Managed Care – PPO | Admitting: Physician Assistant

## 2018-04-06 ENCOUNTER — Ambulatory Visit (INDEPENDENT_AMBULATORY_CARE_PROVIDER_SITE_OTHER): Payer: BC Managed Care – PPO

## 2018-04-06 ENCOUNTER — Encounter (INDEPENDENT_AMBULATORY_CARE_PROVIDER_SITE_OTHER): Payer: Self-pay | Admitting: Physician Assistant

## 2018-04-06 DIAGNOSIS — M79605 Pain in left leg: Secondary | ICD-10-CM | POA: Diagnosis not present

## 2018-04-06 DIAGNOSIS — M5440 Lumbago with sciatica, unspecified side: Secondary | ICD-10-CM | POA: Diagnosis not present

## 2018-04-06 MED ORDER — CYCLOBENZAPRINE HCL 10 MG PO TABS: 10.0000 mg | ORAL_TABLET | Freq: Every day | ORAL | 1 refills | Status: DC

## 2018-04-06 MED ORDER — METHYLPREDNISOLONE 4 MG PO TABS: ORAL_TABLET | ORAL | 0 refills | Status: DC

## 2018-04-06 NOTE — Progress Notes (Signed)
Office Visit Note   Patient: Hailey [name]           Date of Birth: [birth year]           MRN: [id number] Visit Date: 04/06/2018              Requested by: Catha Gosselin, MD [address] [place], [place] [address] PCP: Catha Gosselin, MD   Assessment & Plan: Visit Diagnoses:  1. Pain in left leg   2. Acute left-sided low back pain with sciatica, sciatica laterality unspecified     Plan: She is given a prescription for physical therapy for back exercises, core strengthening, stretching, home exercise program and modalities.  See her back in 3 weeks see what type of response she had to the Medrol Dosepak and Flexeril.  Also asked her to apply moist heat to the low back.  Hopefully at that time will be able to inject both of her knees with Synvisc if this is been approved.  She will follow-up sooner if she develops any worsening symptoms.  Questions were encouraged and answered at length.  Follow-Up Instructions: Return in about 3 weeks (around 04/27/2018).   Orders:  Orders Placed This Encounter  Procedures  . XR Lumbar Spine 2-3 Views   Meds ordered this encounter  Medications  . methylPREDNISolone (MEDROL) 4 MG tablet    Sig: Take as directed    Dispense:  21 tablet    Refill:  0  . cyclobenzaprine (FLEXERIL) 10 MG tablet    Sig: Take 1 tablet (10 mg total) by mouth at bedtime.    Dispense:  30 tablet    Refill:  1      Procedures: No procedures performed   Clinical Data: No additional findings.   Subjective: Chief Complaint  Patient presents with  . Right Knee - Pain  . Left Leg - Pain    HPI Hailey [name] is well-known to Dr. Magnus Ivan service comes in today due to bilateral knee pain.  She last had Synvisc injections in both knees in June 2019 did well until recently.  She has had no new injuries to either knee.  She is also having left leg pain from her hip down to her ankle.  No known injury.  Pain is worse with standing.  She is tried some gabapentin which  is helped some.  Pain does awaken her at night.  She had no bowel bladder dysfunction.  No saddle anesthesia like symptoms.  She does note that stretching seems to help with her back pain and the pain down the left leg.  She notes a tingling sensation down the left leg also. Review of Systems Please see HPI  Objective: Vital Signs: LMP 01/11/2002   Physical Exam Constitutional:      Appearance: She is not ill-appearing or diaphoretic.  Pulmonary:     Effort: Pulmonary effort is normal.  Neurological:     Mental Status: She is alert and oriented to person, place, and time.     Ortho Exam Lower extremities 5 out of 5 strength throughout lower extremities against resistance.  Deep tendon reflexes are 2+ at the knees and ankles and equal symmetric.  Good range of motion both hips without pain.  Negative straight leg raise bilaterally.  She has full forward flexion the lumbar spine.  Discomfort with extension of lumbar spine. Specialty Comments:  No specialty comments available.  Imaging: Xr Lumbar Spine 2-3 Views  Result Date: 04/06/2018  Lumbar spine 2 views:  No acute fractures.  No spondylolisthesis.  Slight scoliosis.  Endplate spurring noted at multiple levels.  Disc space overall well-maintained.    PMFS History: Patient Active Problem List   Diagnosis Date Noted  . Unilateral primary osteoarthritis, left knee 08/04/2017  . Unilateral primary osteoarthritis, right knee 08/04/2017  . Chronic pain of left knee 11/04/2016  . Chronic pain of right knee 11/04/2016  . Trochanteric bursitis, right hip 07/17/2016  . Right hand pain 04/14/2016  . ACUTE PHARYNGITIS 12/23/2008  . ALLERGIC RHINITIS 12/23/2008   Past Medical History:  Diagnosis Date  . Endometriosis   . GERD (gastroesophageal reflux disease)   . Hyperprolactinemia (HCC)    MRI 1992, 2001 AND 2003. LAST MRI NORMAL.  Marland Kitchen Psoriasis     Family History  Problem Relation Age of Onset  . Breast cancer Mother 19  . ALS  Mother   . Cancer Father        PAROTID GLAND  . Hypertension Father   . Breast cancer Maternal Aunt 71  . Heart failure Maternal Aunt   . Cancer Maternal Aunt        Lymphoma  . Heart disease Maternal Grandmother   . Heart disease Paternal Grandmother   . Heart failure Maternal Uncle     Past Surgical History:  Procedure Laterality Date  . BUNIONECTOMY    . DILATION AND CURETTAGE OF UTERUS  2002   AT TIME OF HYSTEROSCOPY  . FOOT SURGERY     spurs  . HYSTEROSCOPY  2002   AT TIME OF DIAG LAP A HYSTEROSCOPY, D&C WAS DONE  . IT band surg    . MOUTH SURGERY    . PELVIC LAPAROSCOPY  2002   DIAG LAP W LASER DONE WITH HYSTEROSCOPY,D&C  . TOTAL ABDOMINAL HYSTERECTOMY  01/11/02   Social History   Occupational History  . Not on file  Tobacco Use  . Smoking status: Never Smoker  . Smokeless tobacco: Never Used  Substance and Sexual Activity  . Alcohol use: Yes    Comment: Rare  . Drug use: No  . Sexual activity: Not Currently    Birth control/protection: Surgical    Comment: HYST-1st intercourse 18 yo-5 partners

## 2018-04-19 ENCOUNTER — Other Ambulatory Visit (INDEPENDENT_AMBULATORY_CARE_PROVIDER_SITE_OTHER): Payer: Self-pay | Admitting: Orthopaedic Surgery

## 2018-04-20 NOTE — Telephone Encounter (Signed)
Please advise 

## 2018-04-27 ENCOUNTER — Ambulatory Visit (INDEPENDENT_AMBULATORY_CARE_PROVIDER_SITE_OTHER): Payer: BC Managed Care – PPO | Admitting: Physician Assistant

## 2018-04-27 ENCOUNTER — Telehealth (INDEPENDENT_AMBULATORY_CARE_PROVIDER_SITE_OTHER): Payer: Self-pay

## 2018-04-27 ENCOUNTER — Encounter (INDEPENDENT_AMBULATORY_CARE_PROVIDER_SITE_OTHER): Payer: Self-pay | Admitting: Physician Assistant

## 2018-04-27 DIAGNOSIS — M1711 Unilateral primary osteoarthritis, right knee: Secondary | ICD-10-CM

## 2018-04-27 DIAGNOSIS — M1712 Unilateral primary osteoarthritis, left knee: Secondary | ICD-10-CM | POA: Diagnosis not present

## 2018-04-27 DIAGNOSIS — M5440 Lumbago with sciatica, unspecified side: Secondary | ICD-10-CM | POA: Diagnosis not present

## 2018-04-27 NOTE — Progress Notes (Signed)
Office Visit Note   Patient: Hailey [name]           Date of Birth: May 06, 1960           MRN: [id number] Visit Date: 04/27/2018              Requested by: Catha Gosselin, MD [address] [place], [place] [address] PCP: Catha Gosselin, MD   Assessment & Plan: Visit Diagnoses:  1. Acute left-sided low back pain with sciatica, sciatica laterality unspecified   2. Unilateral primary osteoarthritis, left knee   3. Unilateral primary osteoarthritis, right knee     Plan: Due to patient's continued radicular symptoms down the left leg recommend MRI of lumbar spine rule out HNP as a source of this radicular pain.  I will have her undergo this and follow-up with [place] once the results are available.  In the interim she will continue to do IT band stretching and back exercises as she has been taught by therapy in the past.  In regards to her knees will continue to work on getting supplemental injections approved for both knees.  Questions encouraged and answered at length.  Follow-Up Instructions: Return for After MRI.   Orders:  No orders of the defined types were placed in this encounter.  No orders of the defined types were placed in this encounter.     Procedures: No procedures performed   Clinical Data: No additional findings.   Subjective: Chief Complaint  Patient presents with  . Left Knee - Follow-up    HPI Hailey [name] returns today 3 weeks follow-up of her left buttocks and radicular symptoms down the left leg.  She states she still having numbness/tingling down her leg to the ankle.  Never goes into the foot.  She has had no waking pain no bowel bladder dysfunction.  No saddle anesthesia like symptoms.  Pain now is worse at night where it was worse when standing.  She feels she is better since taking the Dosepak .She did not go to physical therapy as has been doing exercises taught for her back in the past.  She continues to do IT band stretching exercises on the left.   She also was supposed to have supplemental injections in both knees unfortunately these were not approved as of yet. Review of Systems See HPI otherwise negative  Objective: Vital Signs: LMP 01/11/2002   Physical Exam Constitutional:      Appearance: She is not ill-appearing or diaphoretic.  Pulmonary:     Effort: Pulmonary effort is normal.  Neurological:     Mental Status: She is alert and oriented to person, place, and time.  Psychiatric:        Mood and Affect: Mood normal.     Ortho Exam Lower extremities 5 out of 5 strengths against resistance.  Straight leg raise is negative bilaterally.  Tight hamstrings bilaterally.  She is has good flexion-extension lumbar spine without pain.  Tenderness over the left trochanteric region.  Good range of motion of the left hip. Specialty Comments:  No specialty comments available.  Imaging: No results found.   PMFS History: Patient Active Problem List   Diagnosis Date Noted  . Unilateral primary osteoarthritis, left knee 08/04/2017  . Unilateral primary osteoarthritis, right knee 08/04/2017  . Chronic pain of left knee 11/04/2016  . Chronic pain of right knee 11/04/2016  . Trochanteric bursitis, right hip 07/17/2016  . Right hand pain 04/14/2016  . ACUTE PHARYNGITIS 12/23/2008  . ALLERGIC RHINITIS  12/23/2008   Past Medical History:  Diagnosis Date  . Endometriosis   . GERD (gastroesophageal reflux disease)   . Hyperprolactinemia (HCC)    MRI 1992, 2001 AND 2003. LAST MRI NORMAL.  Marland Kitchen Psoriasis     Family History  Problem Relation Age of Onset  . Breast cancer Mother 64  . ALS Mother   . Cancer Father        PAROTID GLAND  . Hypertension Father   . Breast cancer Maternal Aunt 71  . Heart failure Maternal Aunt   . Cancer Maternal Aunt        Lymphoma  . Heart disease Maternal Grandmother   . Heart disease Paternal Grandmother   . Heart failure Maternal Uncle     Past Surgical History:  Procedure Laterality Date  .  BUNIONECTOMY    . DILATION AND CURETTAGE OF UTERUS  2002   AT TIME OF HYSTEROSCOPY  . FOOT SURGERY     spurs  . HYSTEROSCOPY  2002   AT TIME OF DIAG LAP A HYSTEROSCOPY, D&C WAS DONE  . IT band surg    . MOUTH SURGERY    . PELVIC LAPAROSCOPY  2002   DIAG LAP W LASER DONE WITH HYSTEROSCOPY,D&C  . TOTAL ABDOMINAL HYSTERECTOMY  01/11/02   Social History   Occupational History  . Not on file  Tobacco Use  . Smoking status: Never Smoker  . Smokeless tobacco: Never Used  Substance and Sexual Activity  . Alcohol use: Yes    Comment: Rare  . Drug use: No  . Sexual activity: Not Currently    Birth control/protection: Surgical    Comment: HYST-1st intercourse 18 yo-5 partners

## 2018-04-27 NOTE — Telephone Encounter (Signed)
Get form from [employer] to fill out and submit PA.

## 2018-04-27 NOTE — Telephone Encounter (Signed)
Noted and order changed

## 2018-04-27 NOTE — Telephone Encounter (Signed)
Bilateral gel injections  

## 2018-04-27 NOTE — Telephone Encounter (Signed)
Can you please edit her MRI order for open scanner?

## 2018-04-27 NOTE — Telephone Encounter (Signed)
I called [employer] and patient does need PA for these.  Ins will pay at 100% if no office visit billed.  Will send in to [facility] for PA, then call patient to schedule.

## 2018-04-29 NOTE — Telephone Encounter (Signed)
IC [employer] and they are faxing me the PA form.

## 2018-04-30 NOTE — Telephone Encounter (Signed)
PA submitted to [facility].  Pt will need appt with Rexene Edison for injection Synvisc One bilateral knees.

## 2018-05-13 ENCOUNTER — Telehealth (INDEPENDENT_AMBULATORY_CARE_PROVIDER_SITE_OTHER): Payer: Self-pay

## 2018-05-13 NOTE — Telephone Encounter (Signed)
Called and left a VM advising patient to CB and schedule an appointment with Richardean Canal for gel injection.  Patient is approved for SynviscOne, bilateral knees. Buy and Bill Per Reginia Naas. Insurance pays 100% with no OV billed. Talked with [facility] and was advised that PA has been approved for SynviscOne, bilateral knees [id number] Valid 07/11/2017- 07/11/2018

## 2018-05-15 ENCOUNTER — Other Ambulatory Visit: Payer: Self-pay

## 2018-05-15 ENCOUNTER — Ambulatory Visit
Admission: RE | Admit: 2018-05-15 | Discharge: 2018-05-15 | Disposition: A | Discharge status: Home / Self Care | Payer: BC Managed Care – PPO | Source: Ambulatory Visit | Attending: Physician Assistant | Admitting: Physician Assistant

## 2018-05-15 DIAGNOSIS — M5440 Lumbago with sciatica, unspecified side: Secondary | ICD-10-CM

## 2018-05-23 ENCOUNTER — Other Ambulatory Visit: Payer: BC Managed Care – PPO

## 2018-05-26 ENCOUNTER — Other Ambulatory Visit (INDEPENDENT_AMBULATORY_CARE_PROVIDER_SITE_OTHER): Payer: Self-pay | Admitting: Orthopaedic Surgery

## 2018-05-26 NOTE — Telephone Encounter (Signed)
Please advise 

## 2018-05-28 ENCOUNTER — Telehealth (INDEPENDENT_AMBULATORY_CARE_PROVIDER_SITE_OTHER): Payer: Self-pay

## 2018-05-28 NOTE — Telephone Encounter (Signed)
Called and left a VM for patient to call back and confirm appointment for Monday, 06/01/2018 and to answer COVID-19 screening questions before appointment.

## 2018-06-01 ENCOUNTER — Encounter (INDEPENDENT_AMBULATORY_CARE_PROVIDER_SITE_OTHER): Payer: Self-pay | Admitting: Orthopaedic Surgery

## 2018-06-01 ENCOUNTER — Other Ambulatory Visit (INDEPENDENT_AMBULATORY_CARE_PROVIDER_SITE_OTHER): Payer: Self-pay

## 2018-06-01 ENCOUNTER — Other Ambulatory Visit: Payer: Self-pay

## 2018-06-01 ENCOUNTER — Ambulatory Visit (INDEPENDENT_AMBULATORY_CARE_PROVIDER_SITE_OTHER): Payer: BC Managed Care – PPO | Admitting: Orthopaedic Surgery

## 2018-06-01 DIAGNOSIS — M1712 Unilateral primary osteoarthritis, left knee: Secondary | ICD-10-CM | POA: Diagnosis not present

## 2018-06-01 DIAGNOSIS — M1711 Unilateral primary osteoarthritis, right knee: Secondary | ICD-10-CM

## 2018-06-01 DIAGNOSIS — M5442 Lumbago with sciatica, left side: Secondary | ICD-10-CM

## 2018-06-01 MED ORDER — HYLAN G-F 20 48 MG/6ML IX SOSY
48.0000 mg | PREFILLED_SYRINGE | INTRA_ARTICULAR | Status: AC | PRN
  Administered 2018-06-01: 48 mg via INTRA_ARTICULAR

## 2018-06-01 NOTE — Progress Notes (Signed)
   Procedure Note  Patient: Hailey [name]             Date of Birth: 1960-09-29           MRN: [id number]             Visit Date: 06/01/2018  Procedures: Visit Diagnoses: Unilateral primary osteoarthritis, left knee  Unilateral primary osteoarthritis, right knee  Large Joint Inj: R knee on 06/01/2018 10:27 AM Indications: pain and diagnostic evaluation Details: 22 G 1.5 in needle, superolateral approach  Arthrogram: No  Medications: 48 mg Hylan 48 MG/6ML Outcome: tolerated well, no immediate complications Procedure, treatment alternatives, risks and benefits explained, specific risks discussed. Consent was given by the patient. Immediately prior to procedure a time out was called to verify the correct patient, procedure, equipment, support staff and site/side marked as required. Patient was prepped and draped in the usual sterile fashion.   Large Joint Inj: L knee on 06/01/2018 10:27 AM Indications: pain and diagnostic evaluation Details: 22 G 1.5 in needle, superolateral approach  Arthrogram: No  Medications: 48 mg Hylan 48 MG/6ML Outcome: tolerated well, no immediate complications Procedure, treatment alternatives, risks and benefits explained, specific risks discussed. Consent was given by the patient. Immediately prior to procedure a time out was called to verify the correct patient, procedure, equipment, support staff and site/side marked as required. Patient was prepped and draped in the usual sterile fashion.     The patient is here today for scheduled hyaluronic acid injections with Synvisc 1 in both her knees to treat the pain from osteoarthritis.  She had successful hyaluronic acid injections in both knees in June of last year so is been 10 months.  She is also here to go over an MRI of her lumbar spine.  She has been having sciatic symptoms.  She does have severe trochanteric bursitis on her left side that radiates to the IT band but she was having deep leg pain at night in  her lower leg on the left side and some sciatic pain.  Examination both knees today show no effusion.  She has extremity motion of both knees.  She tolerated the Synvisc 1 in both knees well without difficulty.  MRI of her lumbar spine does show the scoliosis that we saw on plain films.  They are seeing a disc bulge to the left side that may be contacting the left L5 nerve root and this seems to correlate with the symptoms that she is describing.  I do feel that she is appropriate candidate for a lumbar intervention at L5-S1 to the left by Dr. Alvester Morin.  She agrees with this as well given her symptoms.  She is already taking 3 gabapentin at night.  That does help her sleep better and does help with her symptoms.  I would like to refer her to Dr. Alvester Morin for an intervention her lumbar spine.  He can then get her back to [place] in 4 weeks after this intervention.  All question concerns were answered and addressed.

## 2018-06-05 ENCOUNTER — Telehealth (INDEPENDENT_AMBULATORY_CARE_PROVIDER_SITE_OTHER): Payer: Self-pay | Admitting: Orthopaedic Surgery

## 2018-06-05 NOTE — Telephone Encounter (Signed)
I am sorry about my first response.  I thought it was about her knee, but it is actually her back.  I will need to see if Dr. Alvester Morin would consider seeing her earlier for an ESI since she is young and healthy.

## 2018-06-05 NOTE — Telephone Encounter (Signed)
Patient called stating that she spoke with the scheduler for Dr. Alvester Morin and was told that nothing could be done until at least June.  She is wanting to know if Dr. Magnus Ivan could somehow get this pushed through so that Dr. Alvester Morin would see her so she could get some relief for her pain.  CB#[phone].  Thank you

## 2018-06-05 NOTE — Telephone Encounter (Signed)
See message.

## 2018-06-08 NOTE — Telephone Encounter (Signed)
Is first of May an option for you guys?

## 2018-06-08 NOTE — Telephone Encounter (Signed)
See if you can have her set up for an ESI by [facility] for the first week of May.

## 2018-06-08 NOTE — Telephone Encounter (Signed)
Unfortunately, the young and healthy art was not a factor when we needed to postpone injections. However, best I could say is go ahead with left L5 tf first week of May.

## 2018-06-13 ENCOUNTER — Other Ambulatory Visit: Payer: BC Managed Care – PPO

## 2018-06-17 ENCOUNTER — Ambulatory Visit (INDEPENDENT_AMBULATORY_CARE_PROVIDER_SITE_OTHER): Payer: BC Managed Care – PPO | Admitting: Orthopaedic Surgery

## 2018-06-22 ENCOUNTER — Other Ambulatory Visit: Payer: BC Managed Care – PPO

## 2018-06-22 ENCOUNTER — Encounter: Payer: Self-pay | Admitting: Physical Medicine and Rehabilitation

## 2018-06-22 ENCOUNTER — Other Ambulatory Visit (INDEPENDENT_AMBULATORY_CARE_PROVIDER_SITE_OTHER): Payer: Self-pay | Admitting: Orthopaedic Surgery

## 2018-06-22 ENCOUNTER — Other Ambulatory Visit: Payer: Self-pay

## 2018-06-22 ENCOUNTER — Ambulatory Visit: Payer: Self-pay

## 2018-06-22 ENCOUNTER — Ambulatory Visit: Payer: BC Managed Care – PPO | Admitting: Physical Medicine and Rehabilitation

## 2018-06-22 VITALS — BP 144/88 | HR 70 | Temp 97.7°F

## 2018-06-22 DIAGNOSIS — M5416 Radiculopathy, lumbar region: Secondary | ICD-10-CM | POA: Diagnosis not present

## 2018-06-22 MED ORDER — BETAMETHASONE SOD PHOS & ACET 6 (3-3) MG/ML IJ SUSP
12.0000 mg | Freq: Once | INTRAMUSCULAR | Status: AC
  Administered 2018-06-22: 09:00:00 12 mg

## 2018-06-22 NOTE — Progress Notes (Signed)
Numeric Pain Rating Scale and Functional Assessment Average Pain 9   In the last MONTH (on 0-10 scale) has pain interfered with the following?  1. General activity like being  able to carry out your everyday physical activities such as walking, climbing stairs, carrying groceries, or moving a chair?  Rating(6)    +Driver, -BT, -Dye Allergies. 

## 2018-06-22 NOTE — Progress Notes (Signed)
Hailey [name] - 58 y.o. female MRN [id number]  Date of birth: 1960-04-12  Office Visit Note: Visit Date: 06/22/2018 PCP: Catha Gosselin, MD Referred by: Catha Gosselin, MD  Subjective: Chief Complaint  Patient presents with  . Lower Back - Pain  . Left Leg - Pain   HPI: Hailey [name] is a 58 y.o. female who comes in today For evaluation management and potential interventional procedure for low back and left radicular leg pain.  She has been seeing Dr. Doneen Poisson in our office for some time.  Prior to that she was seeing Dr. Margarita Rana and ultimately had lateral iliotibial band release in 2015.  Lumbar spine MRI at the time was fairly normal.  She has had physical therapy and medication management without much relief.  She does take gabapentin at night that seems to help a little bit.  She reports this new onset of pain really was in January of this year and she has a lot of time pain sleeping on the the left side.  Dr. Magnus Ivan completed trochanteric injections with temporary relief.  She does point to pain over the greater trochanter but also somewhat posteriorly and along the gluteus medius.  She has no right-sided complaints.  Pain does travel down a pretty classic L5 distribution to the anterior lateral calf.  She denies any tingling numbness to the feet.  She has had no focal weakness.  Change position does seem to help.  She rates her pain as a 9 out of 10.  During the day she has some relief with movement.  She has had no prior lumbar spine surgery.  New MRI of the lumbar spine was performed this is reviewed with the patient and reviewed below.  She has a left lateral disc protrusion with probably irritation of the L5 nerve root.  Review of Systems  Constitutional: Negative for chills, fever, malaise/fatigue and weight loss.  HENT: Negative for hearing loss and sinus pain.   Eyes: Negative for blurred vision, double vision and photophobia.  Respiratory: Negative for cough and  shortness of breath.   Cardiovascular: Negative for chest pain, palpitations and leg swelling.  Gastrointestinal: Negative for abdominal pain, nausea and vomiting.  Genitourinary: Negative for flank pain.  Musculoskeletal: Positive for back pain. Negative for myalgias.       Left radicular leg pain  Skin: Negative for itching and rash.  Neurological: Negative for tremors, focal weakness and weakness.  Endo/Heme/Allergies: Negative.   Psychiatric/Behavioral: Negative for depression.  All other systems reviewed and are negative.  Otherwise per HPI.  Assessment & Plan: Visit Diagnoses:  1. Lumbar radiculopathy     Plan: Findings:  Chronic history of low back pain and left greater trochanteric pain status post iliotibial band release in 2015 and continued symptoms consistent more with a bursitis.  She also has symptoms referring down the leg in an L5 distribution and now with MRI showing small lateral disc protrusion without nerve compression at L5 which would fit with her symptoms.  She could still have ongoing bursitis as well with tenderness on the left greater trochanter.  We will complete left L5 diagnostic transforaminal injection.  Depending on relief would look at repeat injection versus greater trochanteric injection with fluoroscopic guidance.  She continue with home exercises and current medications including gabapentin.  We did go over the use of gabapentin and how it is typically done.  She is taking it mainly at night.    Meds & Orders:  Meds  ordered this encounter  Medications  . betamethasone acetate-betamethasone sodium phosphate (CELESTONE) injection 12 mg    Orders Placed This Encounter  Procedures  . XR C-ARM NO REPORT  . Epidural Steroid injection    Follow-up: Return if symptoms worsen or fail to improve.   Procedures: No procedures performed  Lumbosacral Transforaminal Epidural Steroid Injection - Sub-Pedicular Approach with Fluoroscopic Guidance  Patient: Hailey [name]      Date of Birth: 1960/09/03 MRN: [id number] PCP: Catha Gosselin, MD      Visit Date: 06/22/2018   Universal Protocol:    Date/Time: 06/22/2018  Consent Given By: the patient  Position: PRONE  Additional Comments: Vital signs were monitored before and after the procedure. Patient was prepped and draped in the usual sterile fashion. The correct patient, procedure, and site was verified.   Injection Procedure Details:  Procedure Site One Meds Administered:  Meds ordered this encounter  Medications  . betamethasone acetate-betamethasone sodium phosphate (CELESTONE) injection 12 mg    Laterality: Left  Location/Site:  L5-S1  Needle size: 22 G  Needle type: Spinal  Needle Placement: Transforaminal  Findings:    -Comments: Excellent flow of contrast along the nerve and into the epidural space.  Procedure Details: After squaring off the end-plates to get a true AP view, the C-arm was positioned so that an oblique view of the foramen as noted above was visualized. The target area is just inferior to the "nose of the scotty dog" or sub pedicular. The soft tissues overlying this structure were infiltrated with 2-3 ml. of 1% Lidocaine without Epinephrine.  The spinal needle was inserted toward the target using a "trajectory" view along the fluoroscope beam.  Under AP and lateral visualization, the needle was advanced so it did not puncture dura and was located close the 6 O'Clock position of the pedical in AP tracterory. Biplanar projections were used to confirm position. Aspiration was confirmed to be negative for CSF and/or blood. A 1-2 ml. volume of Isovue-250 was injected and flow of contrast was noted at each level. Radiographs were obtained for documentation purposes.   After attaining the desired flow of contrast documented above, a 0.5 to 1.0 ml test dose of 0.25% Marcaine was injected into each respective transforaminal space.  The patient was observed for 90  seconds post injection.  After no sensory deficits were reported, and normal lower extremity motor function was noted,   the above injectate was administered so that equal amounts of the injectate were placed at each foramen (level) into the transforaminal epidural space.   Additional Comments:  The patient tolerated the procedure well Dressing: 2 x 2 sterile gauze and Band-Aid    Post-procedure details: Patient was observed during the procedure. Post-procedure instructions were reviewed.  Patient left the clinic in stable condition.     Clinical History: CLINICAL DATA:  Low back pain radiating to LEFT hip and leg. Symptoms began 4 months ago.  EXAM: MRI LUMBAR SPINE WITHOUT CONTRAST  TECHNIQUE: Multiplanar, multisequence MR imaging of the lumbar spine was performed. No intravenous contrast was administered.  COMPARISON:  MRI lumbar spine 11/08/2012  FINDINGS: Segmentation:  Standard  Alignment: No significant retrolisthesis or anterolisthesis. Mild degenerative scoliosis of approximately 10 degrees convex LEFT due to loss of interspace height at L3-4 on the RIGHT. Slight lateral translation L4 on L5.  Vertebrae:  No worrisome osseous lesion  Conus medullaris and cauda equina: Conus extends to the L1 level. Conus and cauda equina appear normal.  Paraspinal  and other soft tissues: Unremarkable  Disc levels:  L1-L2: Annular bulge. Asymmetric LEFT facet arthropathy. No impingement.  L2-L3: Annular bulge. BILATERAL facet arthropathy. No impingement.  L3-L4: Annular bulge. Asymmetric loss of disc space height on the RIGHT. No impingement.  L4-L5: Central and rightward protrusion. Mild facet arthropathy. RIGHT subarticular zone and foraminal zone narrowing not clearly compressive.  L5-S1: Foraminal protrusion to the LEFT. No facet arthropathy or central canal stenosis. In the far lateral LEFT foramen and extraforaminal compartment, there is a  shallow protrusion (8:30, 5:10). The LEFT L5 nerve root could be irritated.  IMPRESSION: Mild degenerative scoliosis as described centered at L3-4 convex LEFT.  Small foraminal protrusion at L5-S1 on the LEFT. Correlate clinically for LEFT L5 radicular symptoms.   Electronically Signed   By: Elsie StainJohn T Curnes M.D.   On: 05/15/2018 20:45   She reports that she has never smoked. She has never used smokeless tobacco. No results for input(s): HGBA1C, LABURIC in the last 8760 hours.  Objective:  VS:  HT:    WT:   BMI:     BP:(!) 144/88  HR:70bpm  TEMP:97.7 F (36.5 C)(Oral)  RESP:  Physical Exam Vitals signs and nursing note reviewed.  Constitutional:      General: She is not in acute distress.    Appearance: Normal appearance. She is well-developed.  HENT:     Head: Normocephalic and atraumatic.     Nose: Nose normal.     Mouth/Throat:     Mouth: Mucous membranes are moist.     Pharynx: Oropharynx is clear.  Eyes:     Conjunctiva/sclera: Conjunctivae normal.     Pupils: Pupils are equal, round, and reactive to light.  Neck:     Musculoskeletal: Normal range of motion and neck supple.  Cardiovascular:     Rate and Rhythm: Regular rhythm.  Pulmonary:     Effort: Pulmonary effort is normal. No respiratory distress.  Abdominal:     General: There is no distension.     Palpations: Abdomen is soft.     Tenderness: There is no guarding.  Musculoskeletal:     Right lower leg: No edema.     Left lower leg: No edema.     Comments: Patient ambulates without aid with normal gait.  She has pain over the left greater trochanter.  No pain with hip rotation.  She has good distal strength.  Skin:    General: Skin is warm and dry.     Findings: No erythema or rash.  Neurological:     General: No focal deficit present.     Mental Status: She is alert and oriented to person, place, and time.     Motor: No abnormal muscle tone.     Coordination: Coordination normal.     Gait: Gait  normal.  Psychiatric:        Mood and Affect: Mood normal.        Behavior: Behavior normal.        Thought Content: Thought content normal.     Ortho Exam Imaging: No results found.  Past Medical/Family/Surgical/Social History: Medications & Allergies reviewed per EMR, new medications updated. Patient Active Problem List   Diagnosis Date Noted  . Unilateral primary osteoarthritis, left knee 08/04/2017  . Unilateral primary osteoarthritis, right knee 08/04/2017  . Chronic pain of left knee 11/04/2016  . Chronic pain of right knee 11/04/2016  . Trochanteric bursitis, right hip 07/17/2016  . Right hand pain 04/14/2016  . ACUTE PHARYNGITIS 12/23/2008  .  ALLERGIC RHINITIS 12/23/2008   Past Medical History:  Diagnosis Date  . Endometriosis   . GERD (gastroesophageal reflux disease)   . Hyperprolactinemia (HCC)    MRI 1992, 2001 AND 2003. LAST MRI NORMAL.  Marland Kitchen Psoriasis    Family History  Problem Relation Age of Onset  . Breast cancer Mother 13  . ALS Mother   . Cancer Father        PAROTID GLAND  . Hypertension Father   . Breast cancer Maternal Aunt 71  . Heart failure Maternal Aunt   . Cancer Maternal Aunt        Lymphoma  . Heart disease Maternal Grandmother   . Heart disease Paternal Grandmother   . Heart failure Maternal Uncle    Past Surgical History:  Procedure Laterality Date  . BUNIONECTOMY    . DILATION AND CURETTAGE OF UTERUS  2002   AT TIME OF HYSTEROSCOPY  . FOOT SURGERY     spurs  . HYSTEROSCOPY  2002   AT TIME OF DIAG LAP A HYSTEROSCOPY, D&C WAS DONE  . IT band surg    . MOUTH SURGERY    . PELVIC LAPAROSCOPY  2002   DIAG LAP W LASER DONE WITH HYSTEROSCOPY,D&C  . TOTAL ABDOMINAL HYSTERECTOMY  01/11/02   Social History   Occupational History  . Not on file  Tobacco Use  . Smoking status: Never Smoker  . Smokeless tobacco: Never Used  Substance and Sexual Activity  . Alcohol use: Yes    Comment: Rare  . Drug use: No  . Sexual activity: Not  Currently    Birth control/protection: Surgical    Comment: HYST-1st intercourse 18 yo-5 partners

## 2018-06-22 NOTE — Telephone Encounter (Signed)
Please advise 

## 2018-06-22 NOTE — Procedures (Signed)
Lumbosacral Transforaminal Epidural Steroid Injection - Sub-Pedicular Approach with Fluoroscopic Guidance  Patient: Hailey [name]      Date of Birth: [birth year] MRN: [id number] PCP: Catha Gosselin, MD      Visit Date: 06/22/2018   Universal Protocol:    Date/Time: 06/22/2018  Consent Given By: the patient  Position: PRONE  Additional Comments: Vital signs were monitored before and after the procedure. Patient was prepped and draped in the usual sterile fashion. The correct patient, procedure, and site was verified.   Injection Procedure Details:  Procedure Site One Meds Administered:  Meds ordered this encounter  Medications  . betamethasone acetate-betamethasone sodium phosphate (CELESTONE) injection 12 mg    Laterality: Left  Location/Site:  L5-S1  Needle size: 22 G  Needle type: Spinal  Needle Placement: Transforaminal  Findings:    -Comments: Excellent flow of contrast along the nerve and into the epidural space.  Procedure Details: After squaring off the end-plates to get a true AP view, the C-arm was positioned so that an oblique view of the foramen as noted above was visualized. The target area is just inferior to the "nose of the scotty dog" or sub pedicular. The soft tissues overlying this structure were infiltrated with 2-3 ml. of 1% Lidocaine without Epinephrine.  The spinal needle was inserted toward the target using a "trajectory" view along the fluoroscope beam.  Under AP and lateral visualization, the needle was advanced so it did not puncture dura and was located close the 6 O'Clock position of the pedical in AP tracterory. Biplanar projections were used to confirm position. Aspiration was confirmed to be negative for CSF and/or blood. A 1-2 ml. volume of Isovue-250 was injected and flow of contrast was noted at each level. Radiographs were obtained for documentation purposes.   After attaining the desired flow of contrast documented above, a 0.5 to 1.0  ml test dose of 0.25% Marcaine was injected into each respective transforaminal space.  The patient was observed for 90 seconds post injection.  After no sensory deficits were reported, and normal lower extremity motor function was noted,   the above injectate was administered so that equal amounts of the injectate were placed at each foramen (level) into the transforaminal epidural space.   Additional Comments:  The patient tolerated the procedure well Dressing: 2 x 2 sterile gauze and Band-Aid    Post-procedure details: Patient was observed during the procedure. Post-procedure instructions were reviewed.  Patient left the clinic in stable condition.

## 2018-07-06 ENCOUNTER — Other Ambulatory Visit: Payer: Self-pay | Admitting: Gynecology

## 2018-07-06 DIAGNOSIS — Z1231 Encounter for screening mammogram for malignant neoplasm of breast: Secondary | ICD-10-CM

## 2018-07-08 ENCOUNTER — Telehealth: Payer: Self-pay | Admitting: *Deleted

## 2018-07-08 MED ORDER — ALPRAZOLAM 0.25 MG PO TABS: 0.2500 mg | ORAL_TABLET | Freq: Four times a day (QID) | ORAL | 2 refills | Status: DC | PRN

## 2018-07-08 NOTE — Telephone Encounter (Signed)
Okay for Xanax 0.25 mg #30, 1 p.o. every 6 hours as needed anxiety refill x2

## 2018-07-08 NOTE — Telephone Encounter (Signed)
Left message for pt to call to find out which pharmacy to call Rx into.

## 2018-07-08 NOTE — Telephone Encounter (Signed)
Rx called in 

## 2018-07-08 NOTE — Telephone Encounter (Signed)
Patient called requesting refill on Xanax 0.25 per note 10/22/17 "History of anxiety.  Had used Xanax in the past.  Does not feel she needs at this time but may call if needs more."  Please advise

## 2018-08-19 ENCOUNTER — Ambulatory Visit (INDEPENDENT_AMBULATORY_CARE_PROVIDER_SITE_OTHER): Payer: BC Managed Care – PPO

## 2018-08-19 ENCOUNTER — Other Ambulatory Visit: Payer: Self-pay

## 2018-08-19 DIAGNOSIS — Z1231 Encounter for screening mammogram for malignant neoplasm of breast: Secondary | ICD-10-CM | POA: Diagnosis not present

## 2018-10-23 ENCOUNTER — Other Ambulatory Visit: Payer: Self-pay

## 2018-10-27 ENCOUNTER — Encounter: Payer: Self-pay | Admitting: Gynecology

## 2018-10-27 ENCOUNTER — Ambulatory Visit: Payer: BC Managed Care – PPO | Admitting: Gynecology

## 2018-10-27 ENCOUNTER — Other Ambulatory Visit: Payer: Self-pay

## 2018-10-27 VITALS — BP 120/70 | Ht 63.0 in | Wt 139.0 lb

## 2018-10-27 DIAGNOSIS — N952 Postmenopausal atrophic vaginitis: Secondary | ICD-10-CM

## 2018-10-27 DIAGNOSIS — Z23 Encounter for immunization: Secondary | ICD-10-CM

## 2018-10-27 DIAGNOSIS — L290 Pruritus ani: Secondary | ICD-10-CM | POA: Diagnosis not present

## 2018-10-27 DIAGNOSIS — Z01419 Encounter for gynecological examination (general) (routine) without abnormal findings: Secondary | ICD-10-CM | POA: Diagnosis not present

## 2018-10-27 MED ORDER — NYSTATIN-TRIAMCINOLONE 100000-0.1 UNIT/GM-% EX OINT: 1.0000 "application " | TOPICAL_OINTMENT | Freq: Two times a day (BID) | CUTANEOUS | 0 refills | Status: DC

## 2018-10-27 NOTE — Addendum Note (Signed)
Addended by: Thurnell Garbe A on: 10/27/2018 04:22 PM   Modules accepted: Orders

## 2018-10-27 NOTE — Patient Instructions (Signed)
Apply the prescribed cream twice daily as needed for irritation.  Follow-up if the symptoms continue.

## 2018-10-27 NOTE — Progress Notes (Signed)
    SARHA BARTELT Apr 09, 1960 [id number]        58 y.o.  G2P1011 for annual gynecologic exam.  Also notes over the past 6 to 9 months itchy irritated area around her anus.  Has been using an old prescription of Proctofoam which seems to help.  No discharge or odor.  Past medical history,surgical history, problem list, medications, allergies, family history and social history were all reviewed and documented as reviewed in the EPIC chart.  ROS:  Performed with pertinent positives and negatives included in the history, assessment and plan.   Additional significant findings : None   Exam: Copywriter, advertising Vitals:   10/27/18 1537  BP: 120/70  Weight: 139 lb (63 kg)  Height: 5\' 3"  (1.6 m)   Body mass index is 24.62 kg/m.  General appearance:  Normal affect, orientation and appearance. Skin: Grossly normal HEENT: Without gross lesions.  No cervical or supraclavicular adenopathy. Thyroid normal.  Lungs:  Clear without wheezing, rales or rhonchi Cardiac: RR, without RMG Abdominal:  Soft, nontender, without masses, guarding, rebound, organomegaly or hernia Breasts:  Examined lying and sitting without masses, retractions, discharge or axillary adenopathy. Pelvic:  Ext, BUS, Vagina: With atrophic changes  Adnexa: Without masses or tenderness    Anus and perineum: Normal   Rectovaginal: Normal sphincter tone without palpated masses or tenderness.    Assessment/Plan:  57 y.o. G29P1011 female for annual gynecologic exam.   1. Postmenopausal.  Status TAH for endometriosis.  No significant menopausal symptoms. 2. Itchy area in the perianal region.  Skin appears normal.  No inflammatory change, lichen sclerosus change or VIN suspicious changes.  Suspect low-grade yeast.  Will treat with Mycolog ointment twice daily as needed.  Assuming symptoms totally disappear then will monitor.  If they persist then will call and pursue a more involved evaluation. 3. History of hyperprolactinemia with MRI  2002 suggesting small adenoma.  Follow-up MRI 2003 was normal.  Follow-up prolactin's were all normal with last prolactin 2015 was 7.  We both agree to stop screening in the absence of symptoms.  No history of galactorrhea headaches or visual changes. 4. DEXA 2012 normal.  Plan repeat DEXA at age [age]. Pap smear 2018.  No Pap smear done today.  No history of significant abnormal Pap smears.  Options to stop screening per current screening guidelines based on hysterectomy reviewed versus less frequent screening intervals which would be next year at a 3-year interval discussed.  Will readdress next year. 6. Mammography July 2020.  Continue with annual mammography when due.  Breast exam normal today. 7. Colonoscopy 2013 with reported repeat interval 10 years. 8. Health maintenance.  No routine lab work done as patient does this elsewhere.  Flu shot received today.  Follow-up 1 year, sooner as needed.   Anastasio Auerbach MD, 4:08 PM 10/27/2018

## 2018-11-17 ENCOUNTER — Encounter: Payer: Self-pay | Admitting: Gynecology

## 2018-12-29 ENCOUNTER — Telehealth: Payer: Self-pay | Admitting: Orthopaedic Surgery

## 2018-12-29 NOTE — Telephone Encounter (Signed)
Can we order these for her please

## 2018-12-29 NOTE — Telephone Encounter (Signed)
Patient is having BIL knee pain and would like to have gel injections.  CB#[phone].  Thank you.

## 2018-12-31 NOTE — Telephone Encounter (Signed)
Submitted online for bilateral Synvisc One injections, [place] patient.  Last injections were in April 2020.

## 2019-01-05 NOTE — Telephone Encounter (Signed)
PA required thru [facility], will submit.

## 2019-01-07 NOTE — Telephone Encounter (Signed)
Can you please call patient and tell her she is approved for the Synvisc One injections bilateral knees?   She still has $400 to meet on deductible, then insurance will cover at 80%.  Unable to tell how much she will owe, but the deductible has to be met.  So at least $400 estimate OOP.  Let me know if she has questions.  Thanks-  Auth # [id number], 96 units, valid 01/06/19 thru 01/06/20. Buy and bill ok.

## 2019-01-19 NOTE — Telephone Encounter (Signed)
I called patient and LM to call me back.    Her last injections were in 2019 and her VOB stated covers at 100% after an $80 copay.   This year's VOB states covers at 80% after deductible is met.    Will discuss with her when she calls me back.

## 2019-01-19 NOTE — Telephone Encounter (Signed)
Pt said she has had gel injections twice, and she's never heard about her deductible having to be met in order to receive the injections. I did try to explain what you said but she's confused and is requesting you give her a call.    [phone]

## 2019-01-19 NOTE — Telephone Encounter (Signed)
I spoke with patient and she understands.  I will call insurance and double check this.

## 2019-01-19 NOTE — Telephone Encounter (Signed)
I scheduled patient, pending reverification with [employer] on benefits.

## 2019-01-20 ENCOUNTER — Encounter: Payer: Self-pay | Admitting: Radiology

## 2019-01-21 NOTE — Telephone Encounter (Signed)
I called [employer] s/w Aris D.  If billed as OP in specialist office, ins covers at 100%.  No copay, no coinsurance.  Ref # [id number].  I called patient LM advising.  I have already scheduled her appt for injections as well.

## 2019-01-28 ENCOUNTER — Other Ambulatory Visit: Payer: Self-pay

## 2019-01-28 ENCOUNTER — Ambulatory Visit (INDEPENDENT_AMBULATORY_CARE_PROVIDER_SITE_OTHER): Payer: BC Managed Care – PPO | Admitting: Physician Assistant

## 2019-01-28 ENCOUNTER — Encounter: Payer: Self-pay | Admitting: Physician Assistant

## 2019-01-28 DIAGNOSIS — M1712 Unilateral primary osteoarthritis, left knee: Secondary | ICD-10-CM | POA: Diagnosis not present

## 2019-01-28 DIAGNOSIS — M1711 Unilateral primary osteoarthritis, right knee: Secondary | ICD-10-CM | POA: Diagnosis not present

## 2019-01-28 MED ORDER — LIDOCAINE HCL 1 % IJ SOLN
0.5000 mL | INTRAMUSCULAR | Status: AC | PRN
  Administered 2019-01-28: .5 mL

## 2019-01-28 MED ORDER — HYLAN G-F 20 48 MG/6ML IX SOSY
48.0000 mg | PREFILLED_SYRINGE | INTRA_ARTICULAR | Status: AC | PRN
  Administered 2019-01-28: 48 mg via INTRA_ARTICULAR

## 2019-01-28 NOTE — Progress Notes (Signed)
   Procedure Note  Patient: Hailey [name]             Date of Birth: [birth year]           MRN: [id number]             Visit Date: 01/28/2019 HPI: Mrs. Kontz comes in today for Synvisc 1 injections both knees.  She states the last injections back in April were very helpful.  She has done well until the last week and now her left knee feels like it might buckle on her.  She has been using Voltaren gel in the knees and this seems to help.  Also magnesium rub on her knees seemed to help also.  Physical exam: Bilateral knees no abnormal warmth erythema or effusion.  Overall good range of motion both knees  Procedures: Visit Diagnoses: No diagnosis found.  Large Joint Inj: bilateral knee on 01/28/2019 2:50 PM Indications: pain Details: 22 G 1.5 in needle, superolateral approach  Arthrogram: No  Medications (Right): 0.5 mL lidocaine 1 %; 48 mg Hylan 48 MG/6ML Medications (Left): 0.5 mL lidocaine 1 %; 48 mg Hylan 48 MG/6ML Outcome: tolerated well, no immediate complications Procedure, treatment alternatives, risks and benefits explained, specific risks discussed. Consent was given by the patient. Immediately prior to procedure a time out was called to verify the correct patient, procedure, equipment, support staff and site/side marked as required. Patient was prepped and draped in the usual sterile fashion.     Plan: She understands that she needs to wait at least 6 months between supplemental injections in both knees.  She will follow-up with [place] on an as-needed basis pain persist or becomes worse.  Continue with Voltaren gel knees and her magnesium oil to the knees.

## 2019-03-31 ENCOUNTER — Encounter: Payer: Self-pay | Admitting: Physician Assistant

## 2019-03-31 ENCOUNTER — Other Ambulatory Visit: Payer: Self-pay

## 2019-03-31 ENCOUNTER — Ambulatory Visit: Payer: BC Managed Care – PPO | Admitting: Physician Assistant

## 2019-03-31 DIAGNOSIS — M7061 Trochanteric bursitis, right hip: Secondary | ICD-10-CM | POA: Diagnosis not present

## 2019-03-31 MED ORDER — METHYLPREDNISOLONE ACETATE 40 MG/ML IJ SUSP
40.0000 mg | INTRAMUSCULAR | Status: AC | PRN
  Administered 2019-03-31: 40 mg via INTRA_ARTICULAR

## 2019-03-31 MED ORDER — LIDOCAINE HCL 1 % IJ SOLN
3.0000 mL | INTRAMUSCULAR | Status: AC | PRN
  Administered 2019-03-31: 3 mL

## 2019-03-31 NOTE — Progress Notes (Addendum)
Office Visit Note   Patient: Hailey [name]           Date of Birth: 1960-10-09           MRN: [id number] Visit Date: 03/31/2019              Requested by: Hailey Gosselin, MD [address] [place],  [place] [address] PCP: Hailey Gosselin, MD   Assessment & Plan: Visit Diagnoses:  1. Trochanteric bursitis, right hip     Plan: Recommend she work on IT band stretching exercises as shown today.  She will follow-up with [place] on as-needed basis.  Questions encouraged and answered.  Follow-Up Instructions: Return if symptoms worsen or fail to improve.   Orders:  Orders Placed This Encounter  Procedures  . Large Joint Inj   No orders of the defined types were placed in this encounter.     Procedures: Large Joint Inj: R greater trochanter on 03/31/2019 3:49 PM Indications: pain Details: 22 G 1.5 in needle, lateral approach  Arthrogram: No  Medications: 3 mL lidocaine 1 %; 40 mg methylPREDNISolone acetate 40 MG/ML Outcome: tolerated well, no immediate complications Procedure, treatment alternatives, risks and benefits explained, specific risks discussed. Consent was given by the patient. Immediately prior to procedure a time out was called to verify the correct patient, procedure, equipment, support staff and site/side marked as required. Patient was prepped and draped in the usual sterile fashion.       Clinical Data: No additional findings.   Subjective: Chief Complaint  Patient presents with  . Right Hip - Pain    HPI Hailey [name] 60 year old female well-known to [name] service comes in today with right hip pain.  Pain is worse whenever she is lying on the hip at night.  States she has a spot which she can press on and causes pain.  Pain does radiate down the lateral aspect of the right knee.  No groin pain.  States it similar to the pain she had on the left whenever she had some IT band problems which she eventually underwent an IT band release on by Hailey [name].   No radicular symptoms.  She does report that the Synvisc injections were very helpful in both knees. Review of Systems Negative for fevers chills shortness of breath.  Objective: Vital Signs: LMP 01/11/2002   Physical Exam Constitutional:      Appearance: She is not diaphoretic.  Pulmonary:     Effort: Pulmonary effort is normal.  Neurological:     Mental Status: She is alert and oriented to person, place, and time.  Psychiatric:        Mood and Affect: Mood normal.     Ortho Exam Right hip excellent internal and external rotation without pain.  Slight discomfort with external rotation of the right hip.  Good range of motion of the left hip without pain.  Tenderness over the right hip trochanteric region.  No tenderness down the IT band. Specialty Comments:  No specialty comments available.  Imaging: No results found.   PMFS History: Patient Active Problem List   Diagnosis Date Noted  . Unilateral primary osteoarthritis, left knee 08/04/2017  . Unilateral primary osteoarthritis, right knee 08/04/2017  . Chronic pain of left knee 11/04/2016  . Chronic pain of right knee 11/04/2016  . Trochanteric bursitis, right hip 07/17/2016  . Right hand pain 04/14/2016  . ACUTE PHARYNGITIS 12/23/2008  . ALLERGIC RHINITIS 12/23/2008   Past Medical History:  Diagnosis Date  .  Endometriosis   . GERD (gastroesophageal reflux disease)   . Hyperprolactinemia ([place])    MRI 1992, 2001 AND 2003. LAST MRI NORMAL.  Marland Kitchen Psoriasis     Family History  Problem Relation Age of Onset  . Breast cancer Mother 69  . ALS Mother   . Cancer Father        PAROTID GLAND  . Hypertension Father   . Breast cancer Maternal Aunt 71  . Heart failure Maternal Aunt   . Cancer Maternal Aunt        Lymphoma  . Heart disease Maternal Grandmother   . Heart disease Paternal Grandmother   . Heart failure Maternal Uncle     Past Surgical History:  Procedure Laterality Date  . BUNIONECTOMY    . DILATION AND  CURETTAGE OF UTERUS  2002   AT TIME OF HYSTEROSCOPY  . FOOT SURGERY     spurs  . HYSTEROSCOPY  2002   AT TIME OF DIAG LAP A HYSTEROSCOPY, D&C WAS DONE  . IT band surg    . MOUTH SURGERY    . PELVIC LAPAROSCOPY  2002   DIAG LAP W LASER DONE WITH HYSTEROSCOPY,D&C  . TOTAL ABDOMINAL HYSTERECTOMY  01/11/02   Social History   Occupational History  . Not on file  Tobacco Use  . Smoking status: Never Smoker  . Smokeless tobacco: Never Used  Substance and Sexual Activity  . Alcohol use: Yes    Comment: Rare  . Drug use: No  . Sexual activity: Not Currently    Birth control/protection: Surgical    Comment: HYST-1st intercourse 75 yo-5 partners

## 2019-04-01 ENCOUNTER — Telehealth: Payer: Self-pay | Admitting: Physician Assistant

## 2019-04-01 NOTE — Telephone Encounter (Signed)
Called patient left voicemail message to return call concerning message of leg pain in mychart. Patient need to schedule an appointment with Bronson Curb

## 2019-05-17 ENCOUNTER — Telehealth: Payer: Self-pay | Admitting: *Deleted

## 2019-05-17 NOTE — Telephone Encounter (Signed)
Ok to repeat 
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
## 2019-05-31 ENCOUNTER — Telehealth: Payer: Self-pay | Admitting: *Deleted

## 2019-05-31 ENCOUNTER — Encounter: Payer: Self-pay | Admitting: Obstetrics & Gynecology

## 2019-05-31 ENCOUNTER — Other Ambulatory Visit: Payer: Self-pay

## 2019-05-31 ENCOUNTER — Ambulatory Visit: Payer: BC Managed Care – PPO | Admitting: Obstetrics & Gynecology

## 2019-05-31 VITALS — BP 126/84

## 2019-05-31 DIAGNOSIS — R1032 Left lower quadrant pain: Secondary | ICD-10-CM | POA: Diagnosis not present

## 2019-05-31 NOTE — Progress Notes (Signed)
    Hailey [name] 24-Nov-1960 [id number]        59 y.o.  G2P1011   RP: LLQ pain x 3 days  HPI: S/P TAH.  Postmenopause, well on no HRT.  Started experiencing left lower abdominal pain on Saturday 3 days ago.  The pain is crampiform and very intense at times.  No intercourse since the pain started.  Associated with more frequent passage of stools but no frank diarrhea.  No blood or mucus in stools.  No vomiting.  Has continued to eat normally.  No urinary tract infection symptoms.  No fever.  OB History  Gravida Para Term Preterm AB Living  2 1 1   1 1   SAB TAB Ectopic Multiple Live Births  1            # Outcome Date GA Lbr Len/2nd Weight Sex Delivery Anes PTL Lv  2 SAB           1 Term             Past medical history,surgical history, problem list, medications, allergies, family history and social history were all reviewed and documented in the EPIC chart.   Directed ROS with pertinent positives and negatives documented in the history of present illness/assessment and plan.  Exam:  Vitals:   05/31/19 1544  BP: 126/84   General appearance:  Normal  Abdomen: Soft, not distended.  BS present, mildly increased throughout the lower abdomen.  Tenderness in LLQ.  No rebound.  Gynecologic exam: Vulva normal.  Bimanual exam: No pelvic mass felt, nontender.   Assessment/Plan:  59 y.o. G2P1011   1. Left lower quadrant abdominal pain Left lower quadrant abdominal pain for 3 days probably gastrointestinal in origin.  No acute abdomen.  No evidence of infection.  We will do a CBC for white blood cell count.  Gynecologic exam normal status post total hysterectomy.  Patient will follow up with a pelvic ultrasound to rule out left ovarian pathology.  Offered to organize an abdominal and pelvic CT scan, but patient declined at this time.  Will pay attention to food intake in relation to the symptoms to see if any intolerance or allergy is developing.  Will present to the emergency room if in  severe pain.  Will consult with her family physician for further investigation per symptoms and pelvic ultrasound results. - CBC - 46 Transvaginal Non-OB; Future  [place] MD, 3:51 PM 05/31/2019

## 2019-05-31 NOTE — Telephone Encounter (Signed)
Patient called and left voicemail c/o severe cramping last at least 72 hours, taking OTC and no relief. Asked what to do, I called back and left detailed message on cell to schedule a visit with provider.

## 2019-05-31 NOTE — Patient Instructions (Signed)
1. Left lower quadrant abdominal pain Left lower quadrant abdominal pain for 3 days probably gastrointestinal in origin.  No acute abdomen.  No evidence of infection.  We will do a CBC for white blood cell count.  Gynecologic exam normal status post total hysterectomy.  Patient will follow up with a pelvic ultrasound to rule out left ovarian pathology.  Offered to organize an abdominal and pelvic CT scan, but patient declined at this time.  Will pay attention to food intake in relation to the symptoms to see if any intolerance or allergy is developing.  Will present to the emergency room if in severe pain.  Will consult with her family physician for further investigation per symptoms and pelvic ultrasound results. - CBC - US Transvaginal Non-OB; Future  [place], it was a pleasure seeing you today!  I will inform you of your results as soon as they are available.

## 2019-06-01 LAB — CBC
HCT: 40.3 % (ref 35.0–45.0)
Hemoglobin: 13.6 g/dL (ref 11.7–15.5)
MCH: 29.7 pg (ref 27.0–33.0)
MCHC: 33.7 g/dL (ref 32.0–36.0)
MCV: 88 fL (ref 80.0–100.0)
MPV: 10.2 fL (ref 7.5–12.5)
Platelets: 288 10*3/uL (ref 140–400)
RBC: 4.58 10*6/uL (ref 3.80–5.10)
RDW: 13 % (ref 11.0–15.0)
WBC: 6.8 10*3/uL (ref 3.8–10.8)

## 2019-06-02 ENCOUNTER — Ambulatory Visit: Payer: BC Managed Care – PPO | Admitting: Orthopaedic Surgery

## 2019-06-18 ENCOUNTER — Other Ambulatory Visit: Payer: Self-pay

## 2019-06-21 ENCOUNTER — Other Ambulatory Visit: Payer: Self-pay

## 2019-06-21 ENCOUNTER — Ambulatory Visit (INDEPENDENT_AMBULATORY_CARE_PROVIDER_SITE_OTHER): Payer: BC Managed Care – PPO

## 2019-06-21 ENCOUNTER — Ambulatory Visit: Payer: BC Managed Care – PPO | Admitting: Obstetrics & Gynecology

## 2019-06-21 DIAGNOSIS — R1032 Left lower quadrant pain: Secondary | ICD-10-CM

## 2019-06-21 NOTE — Progress Notes (Signed)
    Hailey [name] 06-29-1960 [id number]        59 y.o.  G2P1011   RP: Left pelvic pain for Pelvic US  HPI: On 05/31/2019 we noted:  S/P TAH.  Postmenopause, well on no HRT.  Started experiencing left lower abdominal pain on Saturday 3 days ago.  The pain is crampiform and very intense at times.  No intercourse since the pain started.  Associated with more frequent passage of stools but no frank diarrhea.  No blood or mucus in stools.  No vomiting.  Has continued to eat normally.  No urinary tract infection symptoms.  No fever.  Since that visit, has continued to experience left lower quadrant pain and discomfort which is improved after passing a BM.  No blood in stools and no fever.   OB History  Gravida Para Term Preterm AB Living  2 1 1   1 1   SAB TAB Ectopic Multiple Live Births  1            # Outcome Date GA Lbr Len/2nd Weight Sex Delivery Anes PTL Lv  2 SAB           1 Term             Past medical history,surgical history, problem list, medications, allergies, family history and social history were all reviewed and documented in the EPIC chart.   Directed ROS with pertinent positives and negatives documented in the history of present illness/assessment and plan.  Exam:  There were no vitals filed for this visit. General appearance:  Normal  Pelvic today: Nonlatex probe cover used.  T/V images.  Uterus is surgically absent.  Vaginal cuff normal.  Both ovaries identified, bilaterally small with atrophic appearance.  No adnexal mass.  No free fluid seen transabdominally or transvaginally.  Incidental finding: Area of bowel in the left lower quadrant with hypoechoic walls suggestive of inflammation.   Assessment/Plan:  59 y.o. G2P1011   1. Left lower quadrant abdominal pain Pelvic ultrasound findings reviewed with patient.  Vaginal cuff is normal, status post total hysterectomy.  Both ovaries are atrophic and normal.  No adnexal mass.  Patient reassured that her pain is  probably not gynecologic in origin.  Probably gastrointestinal.  Probable bowel wall inflammation in the left lower quadrant per ultrasound.  Refer to Gastro-Enterologist.  46 MD, 9:32 AM 06/21/2019

## 2019-06-27 ENCOUNTER — Encounter: Payer: Self-pay | Admitting: Obstetrics & Gynecology

## 2019-06-27 NOTE — Patient Instructions (Signed)
1. Left lower quadrant abdominal pain Pelvic ultrasound findings reviewed with patient.  Vaginal cuff is normal, status post total hysterectomy.  Both ovaries are atrophic and normal.  No adnexal mass.  Patient reassured that her pain is probably not gynecologic in origin.  Probably gastrointestinal.  Probable bowel wall inflammation in the left lower quadrant per ultrasound.  Refer to Gastro-Enterologist.  Hailey [name], it was a pleasure seeing you today!

## 2019-06-28 ENCOUNTER — Telehealth: Payer: Self-pay | Admitting: *Deleted

## 2019-06-28 NOTE — Telephone Encounter (Signed)
Referral faxed to Dr.Magod office at [phone], they will call to schedule. Patient informed via my chart message referral faxed.

## 2019-06-28 NOTE — Telephone Encounter (Signed)
-----   Message from Genia Del, MD sent at 06/21/2019  9:33 AM EDT ----- Regarding: Refer to patient's Gastro Left lower abdominal pain.  Bowel wall inflammation per Pelvic US.  Patient would like to see Dr Vida Rigger with [facility] on [address], has seen him before.

## 2019-07-06 NOTE — Telephone Encounter (Signed)
Patient said she did hear from Dr.Magod office but can't see her until July, however the July schedule is not open yet and his office told her to call and the end of May to schedule for July.

## 2019-07-19 ENCOUNTER — Other Ambulatory Visit: Payer: Self-pay | Admitting: Obstetrics & Gynecology

## 2019-07-19 DIAGNOSIS — Z1231 Encounter for screening mammogram for malignant neoplasm of breast: Secondary | ICD-10-CM

## 2019-08-12 ENCOUNTER — Ambulatory Visit: Payer: BC Managed Care – PPO

## 2019-08-19 ENCOUNTER — Ambulatory Visit: Payer: BC Managed Care – PPO

## 2019-08-25 ENCOUNTER — Ambulatory Visit (INDEPENDENT_AMBULATORY_CARE_PROVIDER_SITE_OTHER): Payer: BC Managed Care – PPO

## 2019-08-25 ENCOUNTER — Other Ambulatory Visit: Payer: Self-pay

## 2019-08-25 DIAGNOSIS — Z1231 Encounter for screening mammogram for malignant neoplasm of breast: Secondary | ICD-10-CM

## 2019-10-29 ENCOUNTER — Encounter: Payer: BC Managed Care – PPO | Admitting: Obstetrics and Gynecology

## 2019-10-29 ENCOUNTER — Encounter: Payer: Self-pay | Admitting: Obstetrics & Gynecology

## 2019-10-29 ENCOUNTER — Other Ambulatory Visit: Payer: Self-pay

## 2019-10-29 ENCOUNTER — Ambulatory Visit: Payer: BC Managed Care – PPO | Admitting: Obstetrics & Gynecology

## 2019-10-29 VITALS — BP 134/86 | Ht 63.0 in | Wt 136.6 lb

## 2019-10-29 DIAGNOSIS — Z780 Asymptomatic menopausal state: Secondary | ICD-10-CM | POA: Diagnosis not present

## 2019-10-29 DIAGNOSIS — Z13820 Encounter for screening for osteoporosis: Secondary | ICD-10-CM | POA: Diagnosis not present

## 2019-10-29 DIAGNOSIS — Z1272 Encounter for screening for malignant neoplasm of vagina: Secondary | ICD-10-CM | POA: Diagnosis not present

## 2019-10-29 DIAGNOSIS — Z01419 Encounter for gynecological examination (general) (routine) without abnormal findings: Secondary | ICD-10-CM | POA: Diagnosis not present

## 2019-10-29 NOTE — Progress Notes (Signed)
Hailey [name] 09/15/1960 [id number]   History:    59 y.o. G2P1011   RP: LLQ pain x 3 days  HPI: S/P TAH.  Postmenopause, well on no HRT.  No current pelvic pain.  Food intolerances with intestinal cramps, improved on Dicyclomine before meals.  Followed by Laurette Schimke.  Planning a Colonscopy in 2 yrs.  Breasts normal.  Urine normal.  Fasting health labs with Fam MD.  BMI 24.2.  Good fitness and healthy nutrition.  Past medical history,surgical history, family history and social history were all reviewed and documented in the EPIC chart.  Gynecologic History Patient's last menstrual period was 01/11/2002.  Obstetric History OB History  Gravida Para Term Preterm AB Living  2 1 1   1 1   SAB TAB Ectopic Multiple Live Births  1            # Outcome Date GA Lbr Len/2nd Weight Sex Delivery Anes PTL Lv  2 SAB           1 Term              ROS: A ROS was performed and pertinent positives and negatives are included in the history.  GENERAL: No fevers or chills. HEENT: No change in vision, no earache, sore throat or sinus congestion. NECK: No pain or stiffness. CARDIOVASCULAR: No chest pain or pressure. No palpitations. PULMONARY: No shortness of breath, cough or wheeze. GASTROINTESTINAL: No abdominal pain, nausea, vomiting or diarrhea, melena or bright red blood per rectum. GENITOURINARY: No urinary frequency, urgency, hesitancy or dysuria. MUSCULOSKELETAL: No joint or muscle pain, no back pain, no recent trauma. DERMATOLOGIC: No rash, no itching, no lesions. ENDOCRINE: No polyuria, polydipsia, no heat or cold intolerance. No recent change in weight. HEMATOLOGICAL: No anemia or easy bruising or bleeding. NEUROLOGIC: No headache, seizures, numbness, tingling or weakness. PSYCHIATRIC: No depression, no loss of interest in normal activity or change in sleep pattern.     Exam:   BP 134/86   Ht 5\' 3"  (1.6 m)   Wt 136 lb 9.6 oz (62 kg)   LMP 01/11/2002   BMI 24.20 kg/m   Body mass index is  24.2 kg/m.  General appearance : Well developed well nourished female. No acute distress HEENT: Eyes: no retinal hemorrhage or exudates,  Neck supple, trachea midline, no carotid bruits, no thyroidmegaly Lungs: Clear to auscultation, no rhonchi or wheezes, or rib retractions  Heart: Regular rate and rhythm, no murmurs or gallops Breast:Examined in sitting and supine position were symmetrical in appearance, no palpable masses or tenderness,  no skin retraction, no nipple inversion, no nipple discharge, no skin discoloration, no axillary or supraclavicular lymphadenopathy Abdomen: no palpable masses or tenderness, no rebound or guarding Extremities: no edema or skin discoloration or tenderness  Pelvic: Vulva: Normal             Vagina: No gross lesions or discharge.  Pap reflex done.  Cervix/Uterus absent  Adnexa  Without masses or tenderness  Anus: Normla  Pelvic 06/21/2019: Nonlatex probe cover used.  T/V images.  Uterus is surgically absent.  Vaginal cuff normal.  Both ovaries identified, bilaterally small with atrophic appearance.  No adnexal mass.  No free fluid seen transabdominally or transvaginally.  Incidental finding: Area of bowel in the left lower quadrant with hypoechoic walls suggestive of inflammation.   Assessment/Plan:  59 y.o. female for annual exam   1. Encounter for Papanicolaou smear of vagina as part of routine gynecological examination Gynecologic exam  s/p TAH.  Pap reflex done at Vaginal vault.  Breasts normal.  Screening Mammo Negative 08/2019.  Followed by Laurette Schimke.  Will repeat Colono in 2 yrs.  Health labs with Fam MD.  Good BMI at 24.2.  Continue with fitness and healthy nutrition. - Pap IG w/ reflex to HPV when ASC-U  2. Postmenopause Well on no HRT.  3. Screening for osteoporosis  Schedule BD here now.  Vit D supplements, Ca++ intake of 1200-1500 mg daily, regular weight bearing physical acitivities. - DG Bone Density; Future  Other orders - dicyclomine  (BENTYL) 10 MG capsule; Take 10 mg by mouth 4 (four) times daily -  before meals and at bedtime.  Genia Del MD, 4:13 PM 10/29/2019

## 2019-11-03 LAB — PAP IG W/ RFLX HPV ASCU

## 2019-12-27 MED ORDER — ALPRAZOLAM 0.25 MG PO TABS: ORAL_TABLET | ORAL | 2 refills | Status: DC

## 2019-12-27 NOTE — Telephone Encounter (Signed)
Looks like she last received Rx 07/08/18 prescribed by Dr. Audie Box for #30 x 2 refills. Note said "Note Patient called requesting refill on Xanax 0.25 per note 10/22/17 "History of anxiety. Had used Xanax in the past.

## 2020-01-11 ENCOUNTER — Other Ambulatory Visit: Payer: Self-pay

## 2020-01-11 ENCOUNTER — Other Ambulatory Visit: Payer: Self-pay | Admitting: Obstetrics & Gynecology

## 2020-01-11 ENCOUNTER — Ambulatory Visit (INDEPENDENT_AMBULATORY_CARE_PROVIDER_SITE_OTHER): Payer: BC Managed Care – PPO

## 2020-01-11 DIAGNOSIS — Z780 Asymptomatic menopausal state: Secondary | ICD-10-CM

## 2020-01-11 DIAGNOSIS — Z13820 Encounter for screening for osteoporosis: Secondary | ICD-10-CM

## 2020-01-18 ENCOUNTER — Telehealth: Payer: Self-pay | Admitting: Orthopaedic Surgery

## 2020-01-18 NOTE — Telephone Encounter (Signed)
I will try to get auth. Pt has bcbs which can take up to two weeks for approval  Submitted for VOB for Synvisc one-Bilateral knee

## 2020-01-18 NOTE — Telephone Encounter (Signed)
Pt called requesting gel injections.  She made an apt dec 13th. Due to the fact that she is hoping the injections will be approved by then

## 2020-01-24 ENCOUNTER — Telehealth: Payer: Self-pay

## 2020-01-24 NOTE — Telephone Encounter (Signed)
Received denial letter from [facility] stating synvisc one was determined to be not medically necessary   I will submit for VOB for Durolane to see if this is covered

## 2020-01-26 ENCOUNTER — Telehealth: Payer: Self-pay

## 2020-01-26 NOTE — Telephone Encounter (Signed)
Pt called and informed.

## 2020-01-26 NOTE — Telephone Encounter (Signed)
Approved for Durolane-Bilateral knee Buy and Bill $ 80 copay then covered @ 100% Prior auth required Auth # [id number] Dates: 01/25/20-07/23/20

## 2020-01-31 ENCOUNTER — Encounter: Payer: Self-pay | Admitting: Orthopaedic Surgery

## 2020-01-31 ENCOUNTER — Ambulatory Visit: Payer: BC Managed Care – PPO | Admitting: Orthopaedic Surgery

## 2020-01-31 DIAGNOSIS — M1711 Unilateral primary osteoarthritis, right knee: Secondary | ICD-10-CM

## 2020-01-31 DIAGNOSIS — M1712 Unilateral primary osteoarthritis, left knee: Secondary | ICD-10-CM

## 2020-01-31 MED ORDER — SODIUM HYALURONATE 60 MG/3ML IX PRSY
60.0000 mg | PREFILLED_SYRINGE | INTRA_ARTICULAR | Status: AC | PRN
  Administered 2020-01-31: 10:00:00 60 mg via INTRA_ARTICULAR

## 2020-01-31 NOTE — Progress Notes (Signed)
   Procedure Note  Patient: Hailey [name]             Date of Birth: Jun 12, 1960           MRN: [id number]             Visit Date: 01/31/2020  Procedures: Visit Diagnoses:  1. Unilateral primary osteoarthritis, left knee   2. Unilateral primary osteoarthritis, right knee     Large Joint Inj: R knee on 01/31/2020 10:12 AM Indications: diagnostic evaluation and pain Details: 22 G 1.5 in needle, superolateral approach  Arthrogram: No  Medications: 60 mg Sodium Hyaluronate 60 MG/3ML Outcome: tolerated well, no immediate complications Procedure, treatment alternatives, risks and benefits explained, specific risks discussed. Consent was given by the patient. Immediately prior to procedure a time out was called to verify the correct patient, procedure, equipment, support staff and site/side marked as required. Patient was prepped and draped in the usual sterile fashion.   Large Joint Inj: L knee on 01/31/2020 10:12 AM Indications: diagnostic evaluation and pain Details: 22 G 1.5 in needle, superolateral approach  Arthrogram: No  Medications: 60 mg Sodium Hyaluronate 60 MG/3ML Outcome: tolerated well, no immediate complications Procedure, treatment alternatives, risks and benefits explained, specific risks discussed. Consent was given by the patient. Immediately prior to procedure a time out was called to verify the correct patient, procedure, equipment, support staff and site/side marked as required. Patient was prepped and draped in the usual sterile fashion.    The patient comes in today for scheduled hyaluronic acid injection in both knees to treat the pain from osteoarthritis.  She has tried and failed other conservative treatment measures including steroid injections.  Today's injections were given with Durolane.  She understands fully that we are trying these injections to help with her pain from osteoarthritis in her knees.  These types of injections have helped her in the past.  It  has been a year since her last injections.  She has had no acute change in her medical status.  Neither knee has an effusion with good range of motion.  She is worked on just low impact activities but now is getting back with a Systems analyst.  She has had more knee pain since then.  She is 59 years old.  I did place Durolane in both knees which she tolerated well.  All questions and concerns were answered and addressed.  Follow-up is as needed.

## 2020-03-21 DIAGNOSIS — U071 COVID-19: Secondary | ICD-10-CM

## 2020-03-21 HISTORY — DX: COVID-19: U07.1

## 2020-06-05 ENCOUNTER — Ambulatory Visit: Payer: Self-pay

## 2020-06-05 ENCOUNTER — Other Ambulatory Visit: Payer: Self-pay

## 2020-06-05 ENCOUNTER — Ambulatory Visit: Payer: BC Managed Care – PPO | Admitting: Podiatry

## 2020-06-05 ENCOUNTER — Ambulatory Visit (INDEPENDENT_AMBULATORY_CARE_PROVIDER_SITE_OTHER): Payer: BC Managed Care – PPO

## 2020-06-05 DIAGNOSIS — M21619 Bunion of unspecified foot: Secondary | ICD-10-CM

## 2020-06-05 DIAGNOSIS — M2041 Other hammer toe(s) (acquired), right foot: Secondary | ICD-10-CM | POA: Diagnosis not present

## 2020-06-05 DIAGNOSIS — M2042 Other hammer toe(s) (acquired), left foot: Secondary | ICD-10-CM

## 2020-06-05 NOTE — Patient Instructions (Signed)
[email or web address]://[email or web address].aaos.org/en/[email or web address]--conditions/hammer-[email or web address]">  Hammer Toe Hammer toe is a change in the shape, or a deformity, of the toe. The deformity causes the middle joint of the toe to stay bent. Hammer toe starts gradually. At first, the toe can be straightened. Then over time, the toe deformity becomes stiff, inflexible, and permanently bent. Hammer toe usually affects the second, third, or fourth toe. A hammer toe causes pain, especially when wearing shoes. Corns and calluses can result from the toe rubbing against the inside of the shoe. Early treatments to keep the toe straight may relieve pain. As the deformity of the toe becomes stiff and permanent, surgery may be needed to straighten the toe. What are the causes? This condition is caused by abnormal bending of the toe joint that is closest to your foot. Over time, the toe bending downward pulls on the muscles and connections (tendons) of the toe joint, making them weak and stiff. Wearing shoes that are too narrow in the toe box and do not allow toes to fully straighten can cause this condition. What increases the risk? You are more likely to develop this condition if you:  Are an older female.  Wear shoes that are too small, or wear high-heeled shoes that pinch your toes.  Have a second toe that is longer than your big toe (first toe).  Injure your foot or toe.  Have arthritis, or have a nerve or muscle disorder.  Have diabetes or a condition known as Charcot joint, which may cause you to walk abnormally.  Have a family history of hammer toe.  Are a Engineer, mining. What are the signs or symptoms? Pain and deformity of the toe are the main symptoms of this condition. The pain is worse when wearing shoes, walking, or running. Other symptoms may include:  A thickened patch of skin, called a corn or callus, that forms over the top of the bent part of the toe or between the toes.  Redness and a burning feeling on the bent  toe.  An open sore that forms on the top of the bent toe.  Not being able to straighten the affected toe.   How is this diagnosed? This condition is diagnosed based on your symptoms and a physical exam. During the exam, your health care provider will try to straighten your toe to see how stiff the deformity is. You may also have tests, such as:  A blood test to check for rheumatoid arthritis or diabetes.  An X-ray to show how severe the toe deformity is. How is this treated? Treatment for this condition depends on whether the toe is flexible or deformed and no longer moveable. In less severe cases, a hammer toe can be straightened without surgery. These treatments include:  Taping the toe into a straightened position.  Using pads and cushions to protect the bent toe.  Wearing shoes that provide enough room for the toes.  Doing toe-stretching exercises at home.  Taking an NSAID, such as ibuprofen, to reduce pain and swelling.  Using special orthotics or insoles for pain relief and to improve walking. If these treatments do not help or the toe has a severe deformity and cannot be straightened, surgery is the next option. The most common surgeries used to straighten a hammer toe include:  Arthroplasty or osteotomy. Part of the toe joint is reconstructed or removed, which allows the toe to straighten.  Fusion. Cartilage between the two bones of the joint is taken out, and the bones are fused together  into one longer bone.  Implantation. Part of the bone is removed and replaced with an implant to allow the toe to move again.  Flexor tendon transfer. The tendons that curl the toes down (flexor tendons) are repositioned. Follow these instructions at home:  Take over-the-counter and prescription medicines only as told by your health care provider.  Do toe-straightening and stretching exercises as told by your health care provider.  Keep all follow-up visits. This is important. How is  this prevented?  Wear shoes that fit properly and give your toes enough room. Shoes should not cause pain.  Buy shoes at the end of the day to make sure they fit well, since your foot may swell during the day. Make sure they are comfortable before you buy them.  As you age, your shoe size might change, including the width. Measure both feet and buy shoes for the larger foot.  A shoe repair store might be able to stretch shoes that feel tight in spots.  Do not wear high-heeled shoes or shoes with pointed toes. Contact a health care provider if:  Your pain gets worse.  Your toe becomes red or swollen.  You develop an open sore on your toe. Summary  Hammer toe is a condition that gradually causes your toe to become bent and stiff.  Hammer toe can be treated by taping the toe into a straightened position and doing toe-stretching exercises. If these treatments do not help, surgery may be needed.  To prevent this condition, wear shoes that fit properly, give your toes enough room, and do not cause pain. This information is not intended to replace advice given to you by your health care provider. Make sure you discuss any questions you have with your health care provider. Document Revised: 05/13/2019 Document Reviewed: 05/13/2019 Elsevier Patient Education  2021 [employer] Inc.   Bunion A bunion (hallux valgus) is a bump that forms slowly on the inner side of the big toe joint. It occurs when the big toe turns toward the second toe. Bunions may be small at first, but they often get larger over time. They can make walking painful. What are the causes? This condition may be caused by:  Wearing narrow or pointed shoes that force the big toe to press against the other toes.  Abnormal foot development that causes the foot to roll inward.  Changes in the foot that are caused by certain diseases, such as rheumatoid arthritis or polio.  A foot injury. What increases the risk? The following  factors may make you more likely to develop this condition:  Wearing shoes that squeeze the toes together.  Having certain diseases, such as: ? Rheumatoid arthritis. ? Polio. ? Cerebral palsy.  Having family members who have bunions.  Being born with abnormally shaped feet (a foot deformity), such as flat feet or low arches.  Doing activities that put a lot of pressure on the feet, such as ballet dancing. What are the signs or symptoms? The main symptom of this condition is a bump on your big toe that you can notice. Other symptoms may include:  Pain.  Redness and inflammation around your big toe.  Thick or hardened skin on your big toe or between your toes.  Stiffness or loss of motion in your big toe.  Trouble with walking.   How is this diagnosed? This condition may be diagnosed based on your symptoms, medical history, and activities. You may also have tests and imaging, such as:  X-rays. These  allow your health care provider to check the position of the bones in your foot and look for damage to your joint. They also help your health care provider determine the severity of your bunion and the best way to treat it.  Joint aspiration. In this test, a sample of fluid is removed from the toe joint. This test may be done if you are in a lot of pain. It helps rule out diseases that cause painful swelling of the joints, such as arthritis or gout. How is this treated? Treatment depends on the severity of your symptoms. The goal of treatment is to relieve symptoms and prevent your bunion from getting worse. Your health care provider may recommend:  Wearing shoes that have a wide toe box, or using bunion pads to cushion the affected area.  Taping your toes together to keep them in a normal position.  Placing a device inside your shoe (orthotic device) to help reduce pressure on your toe joint.  Taking medicine to ease pain and inflammation.  Putting ice or heat on the affected  area.  Doing stretching exercises.  Surgery, for severe cases. Follow these instructions at home: Managing pain, stiffness, and swelling  If directed, put ice on the painful area. To do this: ? Put ice in a plastic bag. ? Place a towel between your skin and the bag. ? Leave the ice on for 20 minutes, 2-3 times a day. ? Remove the ice if your skin turns bright red. This is very important. If you cannot feel pain, heat, or cold, you have a greater risk of damage to the area.  If directed, apply heat to the affected area before you exercise. Use the heat source that your health care provider recommends, such as a moist heat pack or a heating pad. ? Place a towel between your skin and the heat source. ? Leave the heat on for 20-30 minutes. ? Remove the heat if your skin turns bright red. This is especially important if you are unable to feel pain, heat, or cold. You have a greater risk of getting burned.      General instructions  Do exercises as told by your health care provider.  Support your toe joint with proper footwear, shoe padding, or taping as told by your health care provider.  Take over-the-counter and prescription medicines only as told by your health care provider.  Do not use any products that contain nicotine or tobacco, such as cigarettes, e-cigarettes, and chewing tobacco. If you need help quitting, ask your health care provider.  Keep all follow-up visits. This is important. Contact a health care provider if:  Your symptoms get worse.  Your symptoms do not improve in 2 weeks. Get help right away if:  You have severe pain and trouble with walking. Summary  A bunion is a bump on the inner side of the big toe joint that forms when the big toe turns toward the second toe.  Bunions can make walking painful.  Treatment depends on the severity of your symptoms.  Support your toe joint with proper footwear, shoe padding, or taping as told by your health care  provider. This information is not intended to replace advice given to you by your health care provider. Make sure you discuss any questions you have with your health care provider. Document Revised: 06/11/2019 Document Reviewed: 06/11/2019 Elsevier Patient Education  2021 [employer].

## 2020-06-07 NOTE — Progress Notes (Signed)
Subjective:   Patient ID: Hailey [name], female   DOB: 60 y.o.   MRN: [id number]   HPI 60 year old female presents the office today for concerns of a painful bump on the medial aspect of the right second toe.  She states that this between the first and second toes of the rub causing a skin lesion.  Also she has noticed curling of the fourth toe.  Also she states that she sleeps with a ankle brace due to plan fasciitis on the right side.  She is not aware that she gets discomfort.  She has a history of plantar fasciitis she reports.  This has been stable.  She does have inserts as well.   Review of Systems  All other systems reviewed and are negative.  Past Medical History:  Diagnosis Date  . Endometriosis   . GERD (gastroesophageal reflux disease)   . Hyperprolactinemia (HCC)    MRI 1992, 2001 AND 2003. LAST MRI NORMAL.  Marland Kitchen Psoriasis     Past Surgical History:  Procedure Laterality Date  . BUNIONECTOMY    . DILATION AND CURETTAGE OF UTERUS  2002   AT TIME OF HYSTEROSCOPY  . FOOT SURGERY     spurs  . HYSTEROSCOPY  2002   AT TIME OF DIAG LAP A HYSTEROSCOPY, D&C WAS DONE  . IT band surg    . MOUTH SURGERY    . PELVIC LAPAROSCOPY  2002   DIAG LAP W LASER DONE WITH HYSTEROSCOPY,D&C  . TOTAL ABDOMINAL HYSTERECTOMY  01/11/02     Current Outpatient Medications:  .  Azelastine HCl 137 MCG/SPRAY SOLN, , Disp: , Rfl:  .  fluticasone (FLONASE) 50 MCG/ACT nasal spray, , Disp: , Rfl:  .  albuterol (VENTOLIN HFA) 108 (90 Base) MCG/ACT inhaler, , Disp: , Rfl:  .  ALPRAZolam (XANAX) 0.25 MG tablet, Take one tab po every 8 hours prn anxiety., Disp: 30 tablet, Rfl: 2 .  APPLE CIDER VINEGAR PO, Take by mouth., Disp: , Rfl:  .  Apremilast (OTEZLA) 30 MG TABS, Take by mouth., Disp: , Rfl:  .  Ascorbic Acid (VITAMIN C PO), Take by mouth.  , Disp: , Rfl:  .  B Complex Vitamins (VITAMIN-B COMPLEX PO), Take by mouth.  , Disp: , Rfl:  .  Calcium Carbonate-Vitamin D (CALCIUM + D PO), Take by mouth.   , Disp: , Rfl:  .  cetirizine (ZYRTEC) 10 MG tablet, Take 10 mg by mouth daily., Disp: , Rfl:  .  CINNAMON PO, Take by mouth., Disp: , Rfl:  .  clobetasol cream (TEMOVATE) 0.05 %, APP ON THE SKIN BID PRN, Disp: , Rfl: 0 .  Cranberry 500 MG TABS, Take by mouth., Disp: , Rfl:  .  cycloSPORINE (RESTASIS) 0.05 % ophthalmic emulsion, 1 drop 2 (two) times daily., Disp: , Rfl:  .  dicyclomine (BENTYL) 10 MG capsule, Take 10 mg by mouth 4 (four) times daily -  before meals and at bedtime., Disp: , Rfl:  .  ipratropium (ATROVENT) 0.06 % nasal spray, , Disp: , Rfl:  .  Magnesium Chloride POWD, by Does not apply route., Disp: , Rfl:  .  Misc Natural Products (TURMERIC CURCUMIN) CAPS, , Disp: , Rfl:  .  Multiple Vitamins-Iron (MULTIVITAMIN/IRON PO), Take by mouth.  , Disp: , Rfl:  .  nystatin-triamcinolone ointment (MYCOLOG), Apply 1 application topically 2 (two) times daily., Disp: 30 g, Rfl: 0 .  Omega-3 Fatty Acids (FISH OIL PO), Take by mouth.  , Disp: ,  Rfl:  .  Pediatric Multivitamins-Iron (ANIMAL SHAPES/IRON) 18 MG CHEW, Chew by mouth., Disp: , Rfl:  .  POTASSIUM PO, Take by mouth.  , Disp: , Rfl:  .  Probiotic Product (PROBIOTIC-10 PO), Take by mouth., Disp: , Rfl:  .  Red Yeast Rice Extract (RED YEAST RICE PO), Take by mouth., Disp: , Rfl:  .  Turmeric 400 MG CAPS, Take by mouth., Disp: , Rfl:  .  UNABLE TO FIND, Liver care capsules Stress care caps menocare - hot flushes shatavari - supplement, Disp: , Rfl:  .  vitamin B-12 (CYANOCOBALAMIN) 100 MCG tablet, , Disp: , Rfl:  .  VITAMIN E PO, Take by mouth.  , Disp: , Rfl:   Allergies  Allergen Reactions  . Codeine     REACTION: Itching          Objective:  Physical Exam  General: AAO x3, NAD  Dermatological: Small hyperkeratotic lesion medial aspect of the left second PIPJ.  No underlying ulceration drainage or any signs of infection.  There are no open sores, no preulcerative lesions, no rash or signs of infection  present.  Vascular: Dorsalis Pedis artery and Posterior Tibial artery pedal pulses are 2/4 bilateral with immedate capillary fill time. There is no pain with calf compression, swelling, warmth, erythema.   Neruologic: Grossly intact via light touch bilateral. Vibratory intact via tuning fork bilateral. Protective threshold with Semmes Wienstein monofilament intact to all pedal sites bilateral. Patellar and Achilles deep tendon reflexes 2+ bilateral. No Babinski or clonus noted bilateral.   Musculoskeletal: Mild bunions present on the right foot which is abutting the second digit resulting in the skin lesion.  Hammertoe, adductovarus is present fourth digit.  There is no significant discomfort on the plantar medial tubercle of the calcaneus at the insertion of plantar fascia.  The plantar fascial appears to be intact.  Muscular strength 5/5 in all groups tested bilateral.  Gait: Unassisted, Nonantalgic.       Assessment:   Bunion, digital deformity resulting in hyperkeratotic lesion; plantar fasciitis    Plan:  -Treatment options discussed including all alternatives, risks, and complications -Etiology of symptoms were discussed -X-rays were obtained and reviewed with the patient.  Bunion, hallux abductus is present.  No evidence of acute fracture. -Both conservative as well as surgical treatment options were discussed.  Dispensed offloading pads.  Discussed shoe modifications as well.  Also discussed prescription surgical intervention needed including fixing the bunion as well as exostectomy of the second toe.  She does not consider this in the future. -From plantar fascial standpoint continue with current treatment.  Encourage stretching daily.  Well shoes and good arch supports.  Vivi Barrack DPM

## 2020-07-12 ENCOUNTER — Other Ambulatory Visit: Payer: Self-pay | Admitting: Obstetrics & Gynecology

## 2020-07-12 DIAGNOSIS — Z1231 Encounter for screening mammogram for malignant neoplasm of breast: Secondary | ICD-10-CM

## 2020-08-31 ENCOUNTER — Ambulatory Visit (INDEPENDENT_AMBULATORY_CARE_PROVIDER_SITE_OTHER): Payer: BC Managed Care – PPO

## 2020-08-31 ENCOUNTER — Other Ambulatory Visit: Payer: Self-pay

## 2020-08-31 DIAGNOSIS — Z1231 Encounter for screening mammogram for malignant neoplasm of breast: Secondary | ICD-10-CM

## 2020-09-06 ENCOUNTER — Telehealth: Payer: Self-pay

## 2020-09-06 NOTE — Telephone Encounter (Signed)
Pt would like to start the process of bil knee injections.

## 2020-09-07 NOTE — Telephone Encounter (Signed)
Noted  

## 2020-09-15 ENCOUNTER — Telehealth: Payer: Self-pay

## 2020-09-15 ENCOUNTER — Telehealth: Payer: Self-pay | Admitting: Orthopaedic Surgery

## 2020-09-15 NOTE — Telephone Encounter (Signed)
Talked with patient concerning gel injection.  

## 2020-09-15 NOTE — Telephone Encounter (Signed)
VOB submitted for Durolane, bilateral knee. Pending BV. 

## 2020-09-15 NOTE — Telephone Encounter (Signed)
Patient called needing to know if gel injection has been approved? The number to contact patient is [phone]

## 2020-09-26 ENCOUNTER — Telehealth: Payer: Self-pay

## 2020-09-26 NOTE — Telephone Encounter (Signed)
Durolane injection will not be approved until patient has used fewer pain medications, lower dose of pain medicine since last knee injection, and has had a significant improvement in activities of daily living since last injection.  Please advise.  Thank you.

## 2020-09-26 NOTE — Telephone Encounter (Signed)
Okay. What about her improvement since the last injection?  Patient hasn't been since 01/2020

## 2020-09-27 NOTE — Telephone Encounter (Signed)
Faxed updated note to [employer] at [phone] for Durolane, bilateral knee.

## 2020-10-06 ENCOUNTER — Telehealth: Payer: Self-pay

## 2020-10-06 NOTE — Telephone Encounter (Signed)
Approved for Durolane, bilateral knee. Buy & Bill Patient will be covered at 100% of the allowable amount Co-pay of $80.00 PA Approval# [id number] Valid 09/19/2020- 03/17/2021

## 2020-10-18 ENCOUNTER — Ambulatory Visit: Payer: BC Managed Care – PPO | Admitting: Physician Assistant

## 2020-10-18 ENCOUNTER — Encounter: Payer: Self-pay | Admitting: Physician Assistant

## 2020-10-18 ENCOUNTER — Other Ambulatory Visit: Payer: Self-pay

## 2020-10-18 DIAGNOSIS — M1711 Unilateral primary osteoarthritis, right knee: Secondary | ICD-10-CM | POA: Diagnosis not present

## 2020-10-18 DIAGNOSIS — M1712 Unilateral primary osteoarthritis, left knee: Secondary | ICD-10-CM | POA: Diagnosis not present

## 2020-10-18 MED ORDER — SODIUM HYALURONATE 60 MG/3ML IX PRSY
60.0000 mg | PREFILLED_SYRINGE | INTRA_ARTICULAR | Status: AC | PRN
  Administered 2020-10-18: 60 mg via INTRA_ARTICULAR

## 2020-10-18 NOTE — Progress Notes (Signed)
   Procedure Note  Patient: Hailey [name]             Date of Birth: 05-14-1960           MRN: [id number]             Visit Date: 10/18/2020  HPI: Hailey [name] comes in today for scheduled Durolane injections both knees.  She has known osteoarthritis both knees.  She has had previous supplemental injections in her knees given her good relief.  She has had no new injury to either knee.  She has no upcoming surgical intervention on either knee in the next 6 months.  Physical exam: Bilateral knees no abnormal warmth erythema or effusion.  Good range of motion of both knees.  Procedures: Visit Diagnoses:  1. Unilateral primary osteoarthritis, left knee   2. Unilateral primary osteoarthritis, right knee     Large Joint Inj: bilateral knee on 10/18/2020 4:29 PM Indications: pain Details: 22 G 1.5 in needle, superolateral approach  Arthrogram: No  Medications (Right): 60 mg Sodium Hyaluronate 60 MG/3ML Medications (Left): 60 mg Sodium Hyaluronate 60 MG/3ML Outcome: tolerated well, no immediate complications Procedure, treatment alternatives, risks and benefits explained, specific risks discussed. Consent was given by the patient. Immediately prior to procedure a time out was called to verify the correct patient, procedure, equipment, support staff and site/side marked as required. Patient was prepped and draped in the usual sterile fashion.    Plan: She will follow-up with [place] as needed.  She knows to wait at least 6 months between injections.  Questions were encouraged and answered at length.

## 2020-10-30 ENCOUNTER — Encounter: Payer: Self-pay | Admitting: Obstetrics & Gynecology

## 2020-10-30 ENCOUNTER — Other Ambulatory Visit: Payer: Self-pay

## 2020-10-30 ENCOUNTER — Ambulatory Visit (INDEPENDENT_AMBULATORY_CARE_PROVIDER_SITE_OTHER): Payer: BC Managed Care – PPO | Admitting: Obstetrics & Gynecology

## 2020-10-30 VITALS — BP 114/74 | HR 72 | Resp 16 | Ht 62.75 in | Wt 134.0 lb

## 2020-10-30 DIAGNOSIS — Z01419 Encounter for gynecological examination (general) (routine) without abnormal findings: Secondary | ICD-10-CM | POA: Diagnosis not present

## 2020-10-30 DIAGNOSIS — Z90710 Acquired absence of both cervix and uterus: Secondary | ICD-10-CM | POA: Diagnosis not present

## 2020-10-30 DIAGNOSIS — Z780 Asymptomatic menopausal state: Secondary | ICD-10-CM | POA: Diagnosis not present

## 2020-10-30 NOTE — Progress Notes (Signed)
Hailey [name] May 25, 1960 [id number]   History:    60 y.o. G2P1011    RP:  Established patient presenting for annual gyn exam    HPI: S/P TAH.  Postmenopause, well on no HRT.  No current pelvic pain.  Food intolerances with mild Diverticulosis, followed by Laurette Schimke.  Colono 2022 benign polyps, repeat in 5 yrs.  Breasts normal.  Urine normal.  Fasting health labs with Fam MD.  BMI 23.93.  Good fitness and healthy nutrition.   Past medical history,surgical history, family history and social history were all reviewed and documented in the EPIC chart.  Gynecologic History Patient's last menstrual period was 01/11/2002.  Obstetric History OB History  Gravida Para Term Preterm AB Living  2 1 1   1 1   SAB IAB Ectopic Multiple Live Births  1            # Outcome Date GA Lbr Len/2nd Weight Sex Delivery Anes PTL Lv  2 SAB           1 Term              ROS: A ROS was performed and pertinent positives and negatives are included in the history.  GENERAL: No fevers or chills. HEENT: No change in vision, no earache, sore throat or sinus congestion. NECK: No pain or stiffness. CARDIOVASCULAR: No chest pain or pressure. No palpitations. PULMONARY: No shortness of breath, cough or wheeze. GASTROINTESTINAL: No abdominal pain, nausea, vomiting or diarrhea, melena or bright red blood per rectum. GENITOURINARY: No urinary frequency, urgency, hesitancy or dysuria. MUSCULOSKELETAL: No joint or muscle pain, no back pain, no recent trauma. DERMATOLOGIC: No rash, no itching, no lesions. ENDOCRINE: No polyuria, polydipsia, no heat or cold intolerance. No recent change in weight. HEMATOLOGICAL: No anemia or easy bruising or bleeding. NEUROLOGIC: No headache, seizures, numbness, tingling or weakness. PSYCHIATRIC: No depression, no loss of interest in normal activity or change in sleep pattern.     Exam:   BP 114/74   Pulse 72   Resp 16   Ht 5' 2.75" (1.594 m)   Wt 134 lb (60.8 kg)   LMP 01/11/2002   BMI  23.93 kg/m   Body mass index is 23.93 kg/m.  General appearance : Well developed well nourished female. No acute distress HEENT: Eyes: no retinal hemorrhage or exudates,  Neck supple, trachea midline, no carotid bruits, no thyroidmegaly Lungs: Clear to auscultation, no rhonchi or wheezes, or rib retractions  Heart: Regular rate and rhythm, no murmurs or gallops Breast:Examined in sitting and supine position were symmetrical in appearance, no palpable masses or tenderness,  no skin retraction, no nipple inversion, no nipple discharge, no skin discoloration, no axillary or supraclavicular lymphadenopathy Abdomen: no palpable masses or tenderness, no rebound or guarding Extremities: no edema or skin discoloration or tenderness  Pelvic: Vulva: Normal             Vagina: No gross lesions or discharge  Cervix/Uterus absent  Adnexa  Without masses or tenderness  Anus: Normal   Assessment/Plan:  60 y.o. female for annual exam   1. Well female exam with routine gynecological exam Gynecologic exam status post TAH.  Last Pap test September 2021 was negative, no indication to repeat a Pap test at this time.  Breast exam normal.  Screening mammogram July 2022 was negative.  Colonoscopy August 2022 with benign polyps, will repeat at 5 years.  Health labs with family PA. Good body mass index at 23.93.  Continue  with fitness and healthy nutrition.  2. S/P TAH (total abdominal hysterectomy)  3. Postmenopause Well on no hormone replacement therapy.  Bone density normal in November 2021, repeat at 5 years.  Other orders - MAGNESIUM PO; Take by mouth. - Multiple Vitamins-Minerals (ZINC PO); Take by mouth. - Cholecalciferol (VITAMIN D3 PO); Take by mouth.   Genia Del MD, 3:41 PM 10/30/2020

## 2020-11-07 ENCOUNTER — Telehealth: Payer: Self-pay | Admitting: Orthopaedic Surgery

## 2020-11-07 NOTE — Telephone Encounter (Signed)
Pt called requesting a call back from [facility]. Pt is experiencing pain from gel injection and has medical questions. Please call pt at [phone].

## 2020-11-08 NOTE — Telephone Encounter (Signed)
Pt called stating she is still experiencing pain and would still like to talk with Morrie Sheldon.   [phone]

## 2020-11-08 NOTE — Telephone Encounter (Signed)
LMOM for patient that I was returning her call, if she still had any questions to call us back

## 2020-11-08 NOTE — Telephone Encounter (Signed)
Patient aware of the below message  

## 2020-11-28 ENCOUNTER — Other Ambulatory Visit: Payer: Self-pay

## 2020-11-28 ENCOUNTER — Ambulatory Visit (INDEPENDENT_AMBULATORY_CARE_PROVIDER_SITE_OTHER): Payer: BC Managed Care – PPO | Admitting: *Deleted

## 2020-11-28 DIAGNOSIS — Z23 Encounter for immunization: Secondary | ICD-10-CM

## 2020-12-04 ENCOUNTER — Other Ambulatory Visit: Payer: Self-pay | Admitting: Obstetrics & Gynecology

## 2020-12-04 NOTE — Telephone Encounter (Signed)
Annual exam was on 10/2020 

## 2020-12-13 ENCOUNTER — Ambulatory Visit: Payer: Self-pay

## 2020-12-13 ENCOUNTER — Encounter: Payer: Self-pay | Admitting: Physician Assistant

## 2020-12-13 ENCOUNTER — Ambulatory Visit: Payer: BC Managed Care – PPO | Admitting: Physician Assistant

## 2020-12-13 DIAGNOSIS — M25562 Pain in left knee: Secondary | ICD-10-CM

## 2020-12-13 DIAGNOSIS — M1712 Unilateral primary osteoarthritis, left knee: Secondary | ICD-10-CM

## 2020-12-13 MED ORDER — LIDOCAINE HCL 1 % IJ SOLN
3.0000 mL | INTRAMUSCULAR | Status: AC | PRN
  Administered 2020-12-13: 3 mL

## 2020-12-13 MED ORDER — METHYLPREDNISOLONE ACETATE 40 MG/ML IJ SUSP
40.0000 mg | INTRAMUSCULAR | Status: AC | PRN
  Administered 2020-12-13: 40 mg via INTRA_ARTICULAR

## 2020-12-13 MED ORDER — DICLOFENAC SODIUM 1 % EX GEL: 4.0000 g | Freq: Four times a day (QID) | CUTANEOUS | 1 refills | Status: DC

## 2020-12-13 NOTE — Progress Notes (Signed)
HPI: Mrs. Eley returns today due to left knee pain status post Durolane injection 10/18/2020.  She had bilateral Durolane injections on that date and the right knee is done well.  She has had soreness off and on since left knee injection.  Injection was done from a superior lateral approach patient some discomfort during the injection which was felt to be due to tightening quad muscles and causing needle to come in contact with the articular surface of the patella.  Central going up and down stairs since that time.  States the pain is mostly lateral aspect of the knee to the anterior medial aspect of the knee.  She has been using diclofenac gel and is asking for refill on this today.  Physical exam: Left knee no abnormal warmth erythema.  No effusion.  Good range of motion of the knee.  Significant patellofemoral crepitus with passive range of motion of the knee.  Impression: Left knee osteoarthritis Acute left knee pain    Procedure Note  Patient: LEINAALA CATANESE             Date of Birth: [birth year]           MRN: [id number]             Visit Date: 12/13/2020  Procedures: Visit Diagnoses:  1. Unilateral primary osteoarthritis, left knee     Large Joint Inj: L knee on 12/13/2020 4:50 PM Indications: pain Details: 22 G 1.5 in needle, anterolateral approach  Arthrogram: No  Medications: 3 mL lidocaine 1 %; 40 mg methylPREDNISolone acetate 40 MG/ML Outcome: tolerated well, no immediate complications Procedure, treatment alternatives, risks and benefits explained, specific risks discussed. Consent was given by the patient. Immediately prior to procedure a time out was called to verify the correct patient, procedure, equipment, support staff and site/side marked as required. Patient was prepped and draped in the usual sterile fashion.    Plan we will have her work on Dance movement psychotherapist.  Did recommend cortisone injection in the knee today and she is agreeable.  Patient tolerated injection  well today without any complications.  Follow-up as needed.  Refill on diclofenac gel was given.

## 2020-12-26 ENCOUNTER — Other Ambulatory Visit: Payer: Self-pay | Admitting: Gastroenterology

## 2020-12-26 DIAGNOSIS — R109 Unspecified abdominal pain: Secondary | ICD-10-CM

## 2020-12-27 ENCOUNTER — Telehealth: Payer: Self-pay

## 2020-12-27 NOTE — Telephone Encounter (Signed)
Patient called in voice mail c/o burning with urination and "pelvic floor burning". SHe asked if she could come by and just get a urine specimen or if she needed a visit.  I called her back and left detailed message and advised she would need visit. Dr. Seymour Bars schedule full tomorrow but Dr. Edward Jolly could see her at 10:30 (check in 10:15am).  I recommended if she feels she needs care before then she go to Urgent Care.  I asked her to call me back.

## 2020-12-27 NOTE — Telephone Encounter (Signed)
Patient is happy to see Dr. Edward Jolly in the morning. Appt scheduled.

## 2020-12-28 ENCOUNTER — Ambulatory Visit: Payer: BC Managed Care – PPO | Admitting: Obstetrics and Gynecology

## 2020-12-28 ENCOUNTER — Other Ambulatory Visit (HOSPITAL_COMMUNITY)
Admission: RE | Admit: 2020-12-28 | Discharge: 2020-12-28 | Disposition: A | Discharge status: Home / Self Care | Payer: BC Managed Care – PPO | Source: Ambulatory Visit | Attending: Obstetrics and Gynecology | Admitting: Obstetrics and Gynecology

## 2020-12-28 ENCOUNTER — Encounter: Payer: Self-pay | Admitting: Obstetrics and Gynecology

## 2020-12-28 ENCOUNTER — Other Ambulatory Visit: Payer: Self-pay

## 2020-12-28 VITALS — BP 100/70 | HR 76 | Ht 62.75 in | Wt 134.0 lb

## 2020-12-28 DIAGNOSIS — R102 Pelvic and perineal pain: Secondary | ICD-10-CM | POA: Insufficient documentation

## 2020-12-28 NOTE — Progress Notes (Signed)
GYNECOLOGY  VISIT   HPI: 60 y.o.   Married  Caucasian  female   G2P1011 with Patient's last menstrual period was 01/11/2002.   here for LLQ pain and some pelvic pressure off and on for years. Symptoms have worsened. She has had a negative colonoscopy. Dr.Magod has ordered CT scan for 01-17-21. She though she might have UTI.  She has some urinary frequency.  AZO has not helped.  This weekend she had cramping after urination.  The cramping is significant and causes fatigue.   She denies vaginal discharge.   She states a history of urethral stenosis. Used to see Dr. Darvin Neighbours many years ago for urethral dilation.  No real history of UTI.   She started having menstrual like cramping in 2021.  She kept a dietary journal.  Pelvic US 07/05/19.  Normal other than bowel wall thickening.  She then saw Dr. Claretha Cooper and started Dicyclomine as needed.  Now she is not getting relief with this medication.  Colonoscopy this year showed diverticulosis and polyps.  CT pending as above.  Had Covid in February.   GYNECOLOGIC HISTORY: Patient's last menstrual period was 01/11/2002. Contraception: Hyst Menopausal hormone therapy:  none Last mammogram: 08-31-20 3D/Neg/screening 75yr/BiRads1 Last pap smear:  10-29-19 Neg        OB History     Gravida  2   Para  1   Term  1   Preterm      AB  1   Living  1      SAB  1   IAB      Ectopic      Multiple      Live Births                 Patient Active Problem List   Diagnosis Date Noted   Unilateral primary osteoarthritis, left knee 08/04/2017   Unilateral primary osteoarthritis, right knee 08/04/2017   Chronic pain of left knee 11/04/2016   Chronic pain of right knee 11/04/2016   Trochanteric bursitis, right hip 07/17/2016   Right hand pain 04/14/2016   ACUTE PHARYNGITIS 12/23/2008   ALLERGIC RHINITIS 12/23/2008    Past Medical History:  Diagnosis Date   COVID 03/2020   Endometriosis    GERD (gastroesophageal reflux  disease)    Hyperprolactinemia (HCC)    MRI 1992, 2001 AND 2003. LAST MRI NORMAL.   Psoriasis     Past Surgical History:  Procedure Laterality Date   BUNIONECTOMY     DILATION AND CURETTAGE OF UTERUS  2002   AT TIME OF HYSTEROSCOPY   FOOT SURGERY     spurs   HYSTEROSCOPY  2002   AT TIME OF DIAG LAP A HYSTEROSCOPY, D&C WAS DONE   IT band surg     MOUTH SURGERY     PELVIC LAPAROSCOPY  2002   DIAG LAP W LASER DONE WITH HYSTEROSCOPY,D&C   TOTAL ABDOMINAL HYSTERECTOMY  01/11/02    Current Outpatient Medications  Medication Sig Dispense Refill   albuterol (VENTOLIN HFA) 108 (90 Base) MCG/ACT inhaler      ALPRAZolam (XANAX) 0.25 MG tablet TAKE 1 TABLET BY MOUTH EVERY 8 HOURS AS NEEDED FOR ANXIETY 30 tablet 0   APPLE CIDER VINEGAR PO Take by mouth.     Apremilast (OTEZLA) 30 MG TABS 1 tablet     Ascorbic Acid (VITAMIN C PO) Take by mouth.       Azelastine HCl 137 MCG/SPRAY SOLN      B Complex Vitamins (  VITAMIN-B COMPLEX PO) Take by mouth.       Calcium Carbonate-Vitamin D (CALCIUM + D PO) Take by mouth.       cetirizine (ZYRTEC) 10 MG tablet Take 10 mg by mouth daily.     Cholecalciferol (VITAMIN D3 PO) Take by mouth.     CINNAMON PO Take by mouth.     clobetasol cream (TEMOVATE) 0.05 % APP ON THE SKIN BID PRN  0   Cranberry 500 MG TABS Take by mouth.     cycloSPORINE (RESTASIS) 0.05 % ophthalmic emulsion 1 drop 2 (two) times daily.     diclofenac Sodium (VOLTAREN) 1 % GEL Apply 4 g topically 4 (four) times daily. 350 g 1   dicyclomine (BENTYL) 10 MG capsule Take by mouth. 60mg  daily     fluticasone (FLONASE) 50 MCG/ACT nasal spray      ipratropium (ATROVENT) 0.06 % nasal spray      MAGNESIUM PO Take by mouth.     Multiple Vitamins-Iron (MULTIVITAMIN/IRON PO) Take by mouth.       Multiple Vitamins-Minerals (ZINC PO) Take by mouth.     nystatin-triamcinolone ointment (MYCOLOG) Apply 1 application topically 2 (two) times daily. 30 g 0   Omega-3 Fatty Acids (FISH OIL PO) Take by  mouth.       POTASSIUM PO Take by mouth.       Probiotic Product (PROBIOTIC-10 PO) Take by mouth.     Red Yeast Rice Extract (RED YEAST RICE PO) Take by mouth.     Turmeric 400 MG CAPS Take by mouth.     UNABLE TO FIND Liver care capsules Stress care caps [facility] - hot flushes shatavari - supplement     vitamin B-12 (CYANOCOBALAMIN) 100 MCG tablet      VITAMIN E PO Take by mouth.       No current facility-administered medications for this visit.     ALLERGIES: Codeine  Family History  Problem Relation Age of Onset   Breast cancer Mother 88   ALS Mother    Cancer Father        PAROTID GLAND   Hypertension Father    Breast cancer Maternal Aunt 65   Heart failure Maternal Aunt    Cancer Maternal Aunt        Lymphoma   Heart disease Maternal Grandmother    Heart disease Paternal Grandmother    Heart failure Maternal Uncle     Social History   Socioeconomic History   Marital status: Married    Spouse name: Not on file   Number of children: Not on file   Years of education: Not on file   Highest education level: Not on file  Occupational History   Not on file  Tobacco Use   Smoking status: Never   Smokeless tobacco: Never  Vaping Use   Vaping Use: Never used  Substance and Sexual Activity   Alcohol use: Yes    Comment: 0-2 month   Drug use: No   Sexual activity: Yes    Birth control/protection: Surgical    Comment: HYST-1st intercourse 34 yo-5 partners  Other Topics Concern   Not on file  Social History Narrative   Not on file   Social Determinants of Health   Financial Resource Strain: Not on file  Food Insecurity: Not on file  Transportation Needs: Not on file  Physical Activity: Not on file  Stress: Not on file  Social Connections: Not on file  Intimate Partner Violence: Not on file  Review of Systems  Genitourinary:  Positive for dysuria and pelvic pain (LLQ).  All other systems reviewed and are negative.  PHYSICAL EXAMINATION:    BP  100/70   Pulse 76   Ht 5' 2.75" (1.594 m)   Wt 134 lb (60.8 kg)   LMP 01/11/2002   SpO2 100%   BMI 23.93 kg/m     General appearance: alert, cooperative and appears stated age   Abdomen: soft, non-tender, no masses,  no organomegaly   No abnormal inguinal nodes palpated   Pelvic: External genitalia:  no lesions              Urethra:  normal appearing urethra with no masses, tenderness or lesions              Bartholins and Skenes: normal                 Vagina: normal appearing vagina with normal color and discharge, no lesions              Cervix: absent                Bimanual Exam:  Uterus:  absent.  Bladder plapable.               Adnexa: no mass, fullness, tenderness              Rectal exam: yes.  Confirms.              Anus:  normal sphincter tone, no lesions  Chaperone was present for exam:  Irving Burton, RN  ASSESSMENT   Status post TAH 2003.  Ovaries remain.  Pelvic pain in female. LLQ pain.  I suspect GI source.  PLAN  Urinalysis:  sg 1010, pH 6.5, 0 - 5 WBC, 0 - 2 RBC, 0 - 5 epis, few bacteria. UC.  No abx today.  Patient agrees to wait for results.  Nuswab sent.  Continue care with GI. FU prn.    An After Visit Summary was printed and given to the patient.  27 min  total time was spent for this patient encounter, including preparation, face-to-face counseling with the patient, coordination of care, and documentation of the encounter.

## 2020-12-29 ENCOUNTER — Other Ambulatory Visit: Payer: Self-pay | Admitting: Gastroenterology

## 2020-12-29 DIAGNOSIS — R109 Unspecified abdominal pain: Secondary | ICD-10-CM

## 2020-12-29 LAB — CERVICOVAGINAL ANCILLARY ONLY
Bacterial Vaginitis (gardnerella): NEGATIVE
Candida Glabrata: NEGATIVE
Candida Vaginitis: NEGATIVE
Comment: NEGATIVE
Comment: NEGATIVE
Comment: NEGATIVE
Comment: NEGATIVE
Trichomonas: NEGATIVE

## 2020-12-29 MED ORDER — SULFAMETHOXAZOLE-TRIMETHOPRIM 800-160 MG PO TABS: 1.0000 | ORAL_TABLET | Freq: Two times a day (BID) | ORAL | 0 refills | Status: DC

## 2020-12-29 NOTE — Telephone Encounter (Signed)
I called patient and she does not have and renal or liver disease issues. Rx sent. Patient informed with all the below.

## 2020-12-29 NOTE — Telephone Encounter (Signed)
Please confirm that she doesn't have renal or liver disease. Then send in bactrim ds, 1 po BID x 3 days She can also take AZO x 48 hours

## 2020-12-30 LAB — CULTURE INDICATED

## 2020-12-30 LAB — URINALYSIS, COMPLETE W/RFL CULTURE
Bilirubin Urine: NEGATIVE
Glucose, UA: NEGATIVE
Hyaline Cast: NONE SEEN /LPF
Ketones, ur: NEGATIVE
Leukocyte Esterase: NEGATIVE
Nitrites, Initial: NEGATIVE
Protein, ur: NEGATIVE
Specific Gravity, Urine: 1.01 (ref 1.001–1.035)
pH: 6.5 (ref 5.0–8.0)

## 2020-12-30 LAB — URINE CULTURE
MICRO NUMBER:: 12620699
Result:: NO GROWTH
SPECIMEN QUALITY:: ADEQUATE

## 2021-01-02 ENCOUNTER — Other Ambulatory Visit: Payer: Self-pay

## 2021-01-02 ENCOUNTER — Ambulatory Visit: Payer: BC Managed Care – PPO

## 2021-01-02 DIAGNOSIS — R109 Unspecified abdominal pain: Secondary | ICD-10-CM

## 2021-01-02 MED ORDER — IOHEXOL 300 MG/ML  SOLN
100.0000 mL | Freq: Once | INTRAMUSCULAR | Status: AC | PRN
  Administered 2021-01-02: 100 mL via INTRAVENOUS

## 2021-01-03 ENCOUNTER — Other Ambulatory Visit: Payer: BC Managed Care – PPO

## 2021-01-05 ENCOUNTER — Other Ambulatory Visit: Payer: Self-pay | Admitting: Gastroenterology

## 2021-01-05 DIAGNOSIS — R93429 Abnormal radiologic findings on diagnostic imaging of unspecified kidney: Secondary | ICD-10-CM

## 2021-01-15 ENCOUNTER — Other Ambulatory Visit: Payer: Self-pay

## 2021-01-15 ENCOUNTER — Ambulatory Visit: Payer: BC Managed Care – PPO

## 2021-01-15 DIAGNOSIS — R93429 Abnormal radiologic findings on diagnostic imaging of unspecified kidney: Secondary | ICD-10-CM

## 2021-01-15 MED ORDER — GADOBUTROL 1 MMOL/ML IV SOLN
6.0000 mL | Freq: Once | INTRAVENOUS | Status: AC | PRN
  Administered 2021-01-15: 10:00:00 6 mL via INTRAVENOUS

## 2021-01-17 ENCOUNTER — Other Ambulatory Visit: Payer: BC Managed Care – PPO

## 2021-03-05 IMAGING — MR MRI LUMBAR SPINE WITHOUT CONTRAST
4 of 5 series · 27 of 48 positions shown · non-contrast
Comparison: MRI lumbar spine 11/08/2012

CLINICAL DATA: Low back pain radiating to LEFT hip and leg.
Symptoms began 4 months ago.

EXAM:
MRI LUMBAR SPINE WITHOUT CONTRAST
TECHNIQUE: Multiplanar, multisequence MR imaging of the lumbar spine was
performed. No intravenous contrast was administered.

[Series 4: T1 · sagittal · 4.0mm · 0.55mm/px · 5 of 13 slices shown (1 of 2)]
[im 1/13]
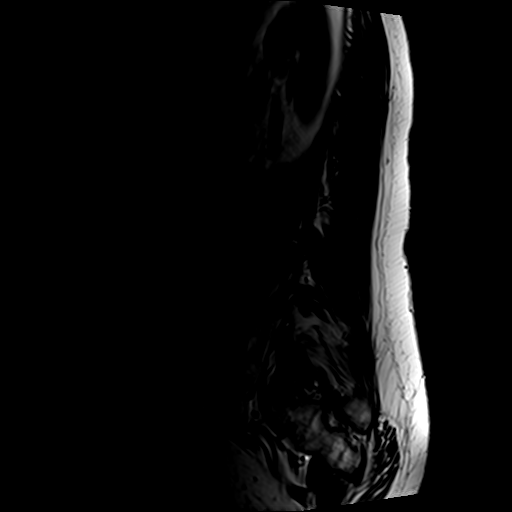
[im 4/13]
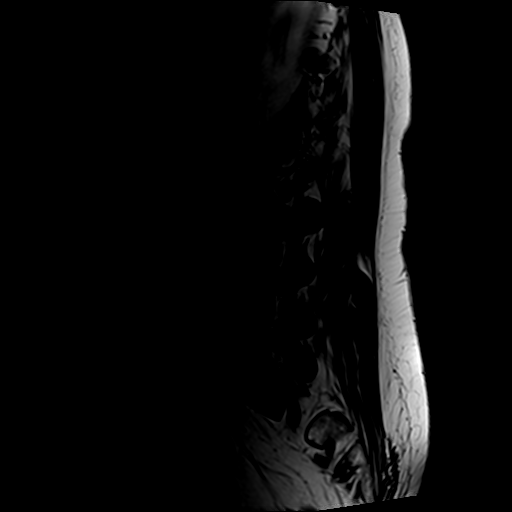
[im 7/13]
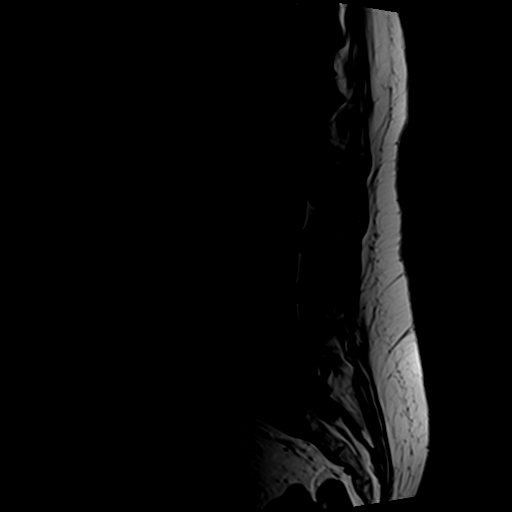
[im 10/13]
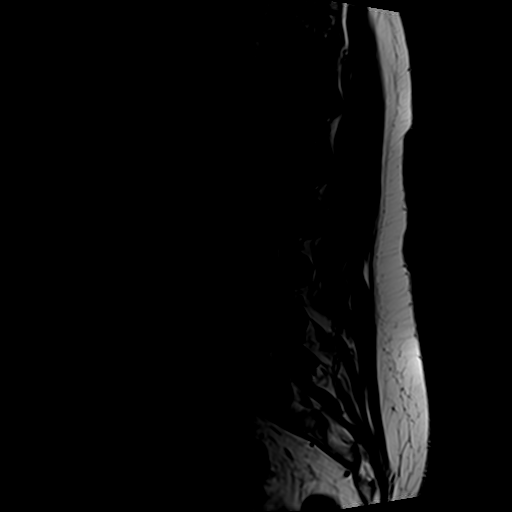
[im 13/13]
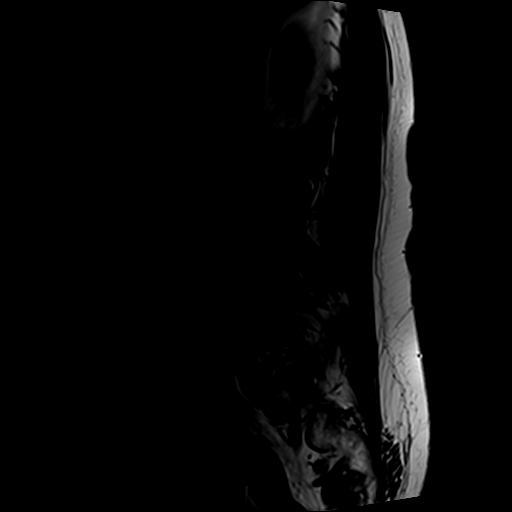

[Series 6: T2 post-contrast · sagittal · 4.0mm · 0.55mm/px · 6 of 13 slices shown]
[im 1/13]
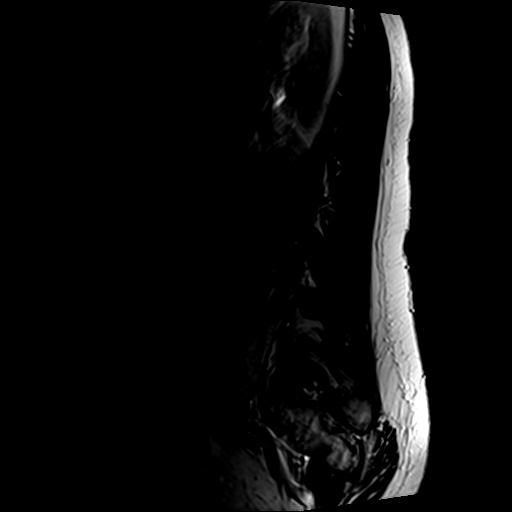
[im 3/13]
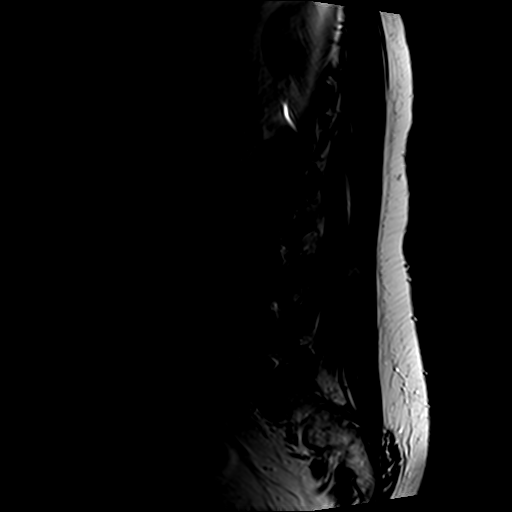
[im 5/13]
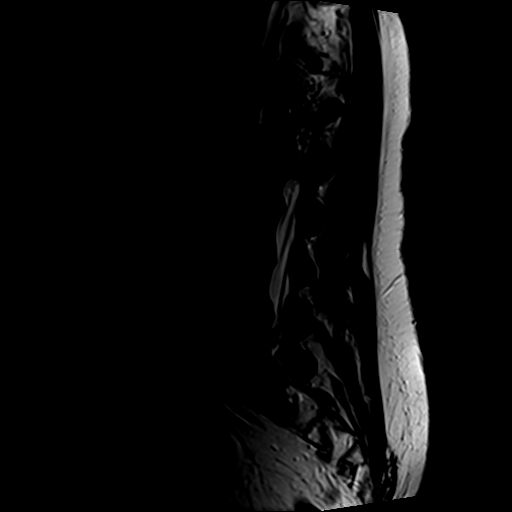
[im 8/13]
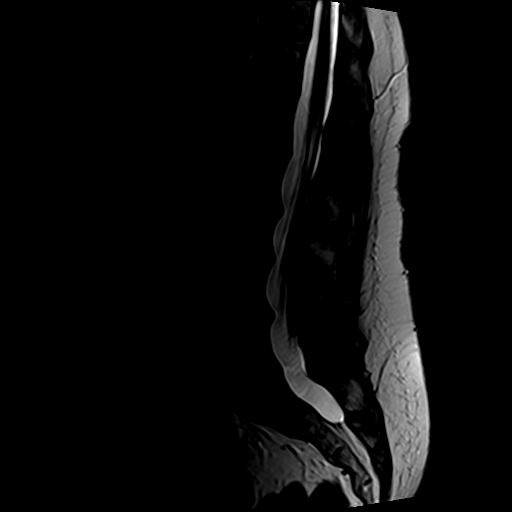
[im 10/13]
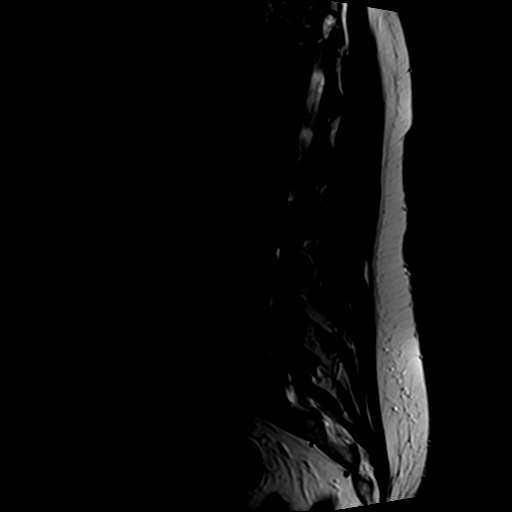
[im 13/13]
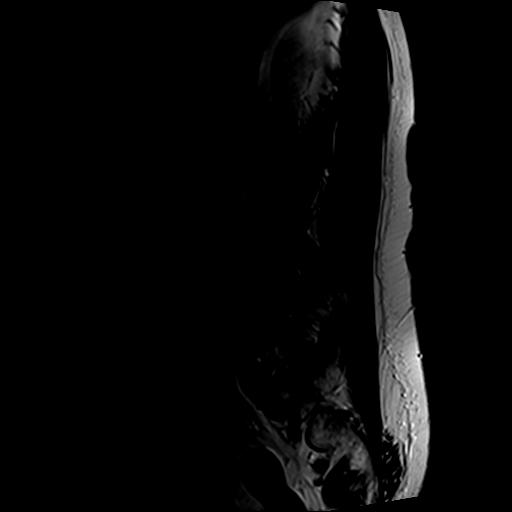

[Series 7: T1 · axial · 4.0mm · 0.35mm/px · z∈[-88,+71]mm · 6 of 37 slices shown (2 of 2)]
[im 3/37]
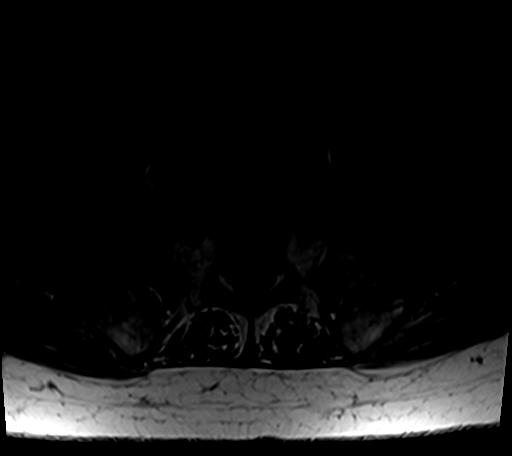
[im 5/37]
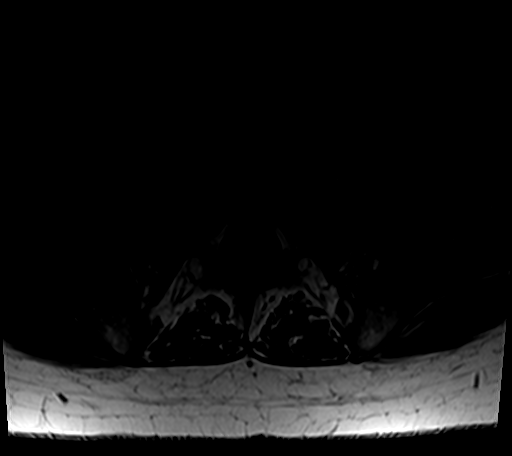
[im 8/37]
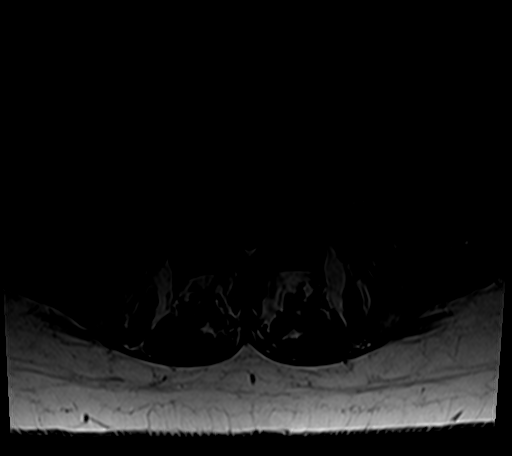
[im 13/37]
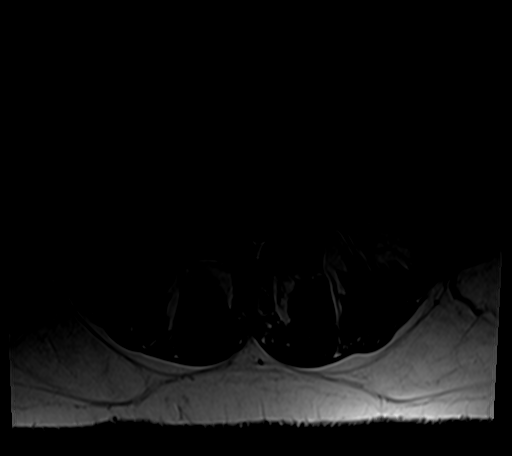
[im 20/37]
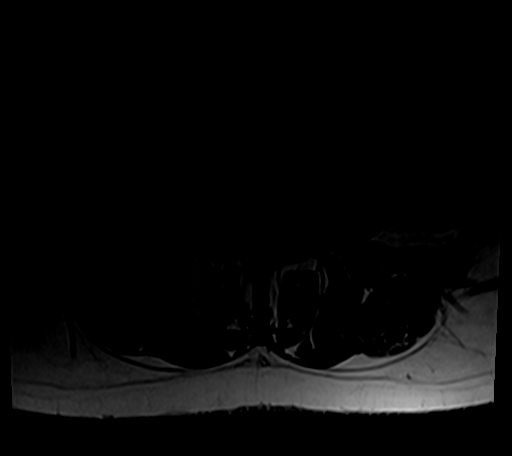
[im 32/37]
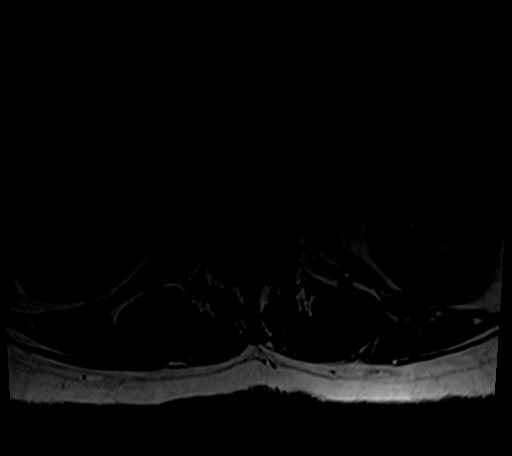

[Series 8: T2 · axial · 4.0mm · 0.70mm/px · z∈[-88,+110]mm · 10 of 37 slices shown]
[im 3/37]
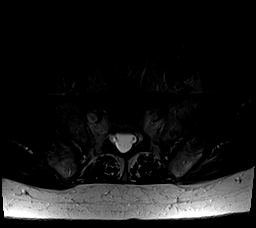
[im 5/37]
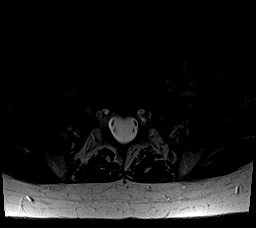
[im 8/37]
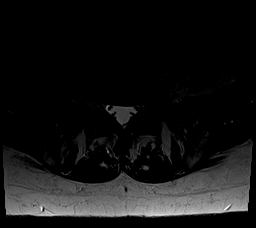
[im 13/37]
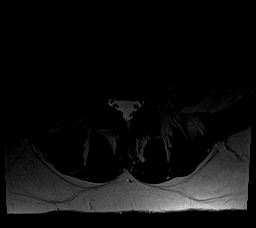
[im 17/37]
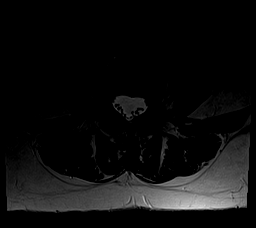
[im 20/37]
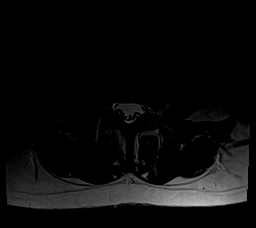
[im 22/37]
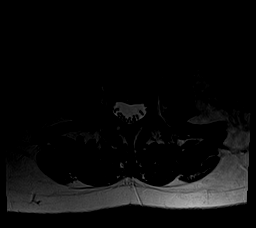
[im 27/37]
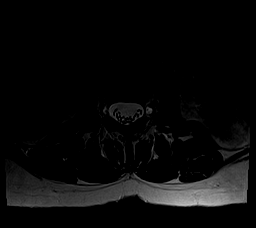
[im 32/37]
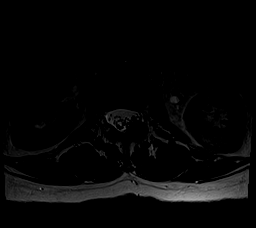
[im 37/37]
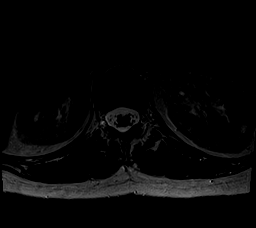

[27 of 48 positions shown; findings below may reference images not displayed]

FINDINGS: Segmentation:  Standard

Alignment: No significant retrolisthesis or anterolisthesis. Mild
degenerative scoliosis of approximately 10 degrees convex LEFT due
to loss of interspace height at L3-4 on the RIGHT. Slight lateral
translation L4 on L5.

Vertebrae:  No worrisome osseous lesion

Conus medullaris and cauda equina: Conus extends to the L1 level.
Conus and cauda equina appear normal.

Paraspinal and other soft tissues: Unremarkable

Disc levels:

L1-L2: Annular bulge. Asymmetric LEFT facet arthropathy. No
impingement.

L2-L3: Annular bulge. BILATERAL facet arthropathy. No impingement.

L3-L4: Annular bulge. Asymmetric loss of disc space height on the
RIGHT. No impingement.

L4-L5: Central and rightward protrusion. Mild facet arthropathy.
RIGHT subarticular zone and foraminal zone narrowing not clearly
compressive.

L5-S1: Foraminal protrusion to the LEFT. No facet arthropathy or
central canal stenosis. In the far lateral LEFT foramen and
extraforaminal compartment, there is a shallow protrusion ([DATE],
[DATE]). The LEFT L5 nerve root could be irritated.
IMPRESSION: Mild degenerative scoliosis as described centered at L3-4 convex
LEFT.

Small foraminal protrusion at L5-S1 on the LEFT. Correlate
clinically for LEFT L5 radicular symptoms.

## 2021-04-11 ENCOUNTER — Telehealth: Payer: Self-pay

## 2021-04-11 NOTE — Telephone Encounter (Signed)
VOB submitted for Orthovisc, bilateral knee. ?BV pending. ?

## 2021-04-13 ENCOUNTER — Telehealth: Payer: Self-pay

## 2021-04-13 NOTE — Telephone Encounter (Signed)
PA required for Orthovisc, bilateral knee. Submitted online through [employer] PA pending# [id number]

## 2021-04-24 ENCOUNTER — Telehealth: Payer: Self-pay

## 2021-04-24 NOTE — Telephone Encounter (Signed)
Approved for Orthovisc, bilateral knee. ?[place] ?Once OOP has been met, patient is covered at 100%. ?Co-pay of $80.00, may be per visit ?PA Approval# [id number] ?Valid 04/13/2021- 10/09/2021 ? ? ?

## 2021-04-30 ENCOUNTER — Encounter: Payer: Self-pay | Admitting: Physician Assistant

## 2021-04-30 ENCOUNTER — Ambulatory Visit: Payer: BC Managed Care – PPO | Admitting: Physician Assistant

## 2021-04-30 DIAGNOSIS — M1711 Unilateral primary osteoarthritis, right knee: Secondary | ICD-10-CM

## 2021-04-30 DIAGNOSIS — M170 Bilateral primary osteoarthritis of knee: Secondary | ICD-10-CM

## 2021-04-30 DIAGNOSIS — M1712 Unilateral primary osteoarthritis, left knee: Secondary | ICD-10-CM

## 2021-04-30 MED ORDER — HYALURONAN 30 MG/2ML IX SOSY
30.0000 mg | PREFILLED_SYRINGE | INTRA_ARTICULAR | Status: AC | PRN
  Administered 2021-04-30: 30 mg via INTRA_ARTICULAR

## 2021-04-30 NOTE — Progress Notes (Signed)
? ?  Procedure Note ? ?Patient: Hailey [name]             ?Date of Birth: 1960/09/19           ?MRN: [id number]             ?Visit Date: 04/30/2021 ?HPI: Hailey [name] is well-known to Dr. Raye Sorrow service comes in today for Orthovisc injections both knees.  She has had no new injury to either knee.  She has known osteoarthritis of both knees.  She had previous supplemental injections in the past that have been giving her relief.  She has no scheduled knee surgery on either knee in the next 6 months. ? ?Bilateral knees: Good range of motion both knees no abnormal warmth erythema or effusion. ? ?Procedures: ?Visit Diagnoses:  ?1. Unilateral primary osteoarthritis, left knee   ?2. Unilateral primary osteoarthritis, right knee   ? ? ?Large Joint Inj: bilateral knee on 04/30/2021 11:39 AM ?Indications: pain ?Details: 22 G 1.5 in needle, superolateral approach ? ?Arthrogram: No ? ?Medications (Right): 30 mg Hyaluronan 30 MG/2ML ?Medications (Left): 30 mg Hyaluronan 30 MG/2ML ?Outcome: tolerated well, no immediate complications ?Procedure, treatment alternatives, risks and benefits explained, specific risks discussed. Consent was given by the patient. Immediately prior to procedure a time out was called to verify the correct patient, procedure, equipment, support staff and site/side marked as required. Patient was prepped and draped in the usual sterile fashion.  ? ? ?Plan: We will see her back next week for her second Orthovisc injections both knees.  Patient tolerated injections well today. ? ? ?

## 2021-05-10 ENCOUNTER — Encounter: Payer: Self-pay | Admitting: Physician Assistant

## 2021-05-10 ENCOUNTER — Ambulatory Visit: Payer: BC Managed Care – PPO | Admitting: Physician Assistant

## 2021-05-10 DIAGNOSIS — M170 Bilateral primary osteoarthritis of knee: Secondary | ICD-10-CM

## 2021-05-10 DIAGNOSIS — M1711 Unilateral primary osteoarthritis, right knee: Secondary | ICD-10-CM | POA: Diagnosis not present

## 2021-05-10 DIAGNOSIS — M1712 Unilateral primary osteoarthritis, left knee: Secondary | ICD-10-CM | POA: Diagnosis not present

## 2021-05-10 MED ORDER — HYALURONAN 30 MG/2ML IX SOSY
30.0000 mg | PREFILLED_SYRINGE | INTRA_ARTICULAR | Status: AC | PRN
  Administered 2021-05-10: 30 mg via INTRA_ARTICULAR

## 2021-05-10 NOTE — Progress Notes (Signed)
? ?  Procedure Note ? ?Patient: Hailey [name]             ?Date of Birth: [birth year]           ?MRN: [id number]             ?Visit Date: 05/10/2021 ?HPI: Hailey [name] returns today for second set of Orthovisc injections bilateral knees.  She has had no adverse effects to the first injections and actually feels that her knees are doing somewhat better. ? ?Physical exam:: Bilateral knees no abnormal warmth erythema or effusion. ?Procedures: ?Visit Diagnoses:  ?1. Unilateral primary osteoarthritis, left knee   ?2. Unilateral primary osteoarthritis, right knee   ? ? ?Large Joint Inj: bilateral knee on 05/10/2021 4:40 PM ?Indications: pain ?Details: 22 G 1.5 in needle, superolateral approach ? ?Arthrogram: No ? ?Medications (Right): 30 mg Hyaluronan 30 MG/2ML ?Medications (Left): 30 mg Hyaluronan 30 MG/2ML ?Outcome: tolerated well, no immediate complications ?Procedure, treatment alternatives, risks and benefits explained, specific risks discussed. Consent was given by the patient. Immediately prior to procedure a time out was called to verify the correct patient, procedure, equipment, support staff and site/side marked as required. Patient was prepped and draped in the usual sterile fashion.  ? ? ? ?Plan: She will follow-up with [place] in 1 week for third Orthovisc injections both knees.  Tolerated injections well today. ? ?

## 2021-05-21 ENCOUNTER — Ambulatory Visit: Payer: BC Managed Care – PPO | Admitting: Physician Assistant

## 2021-05-21 ENCOUNTER — Encounter: Payer: Self-pay | Admitting: Physician Assistant

## 2021-05-21 DIAGNOSIS — M1711 Unilateral primary osteoarthritis, right knee: Secondary | ICD-10-CM | POA: Diagnosis not present

## 2021-05-21 DIAGNOSIS — M170 Bilateral primary osteoarthritis of knee: Secondary | ICD-10-CM | POA: Diagnosis not present

## 2021-05-21 DIAGNOSIS — M1712 Unilateral primary osteoarthritis, left knee: Secondary | ICD-10-CM

## 2021-05-21 MED ORDER — HYALURONAN 30 MG/2ML IX SOSY
30.0000 mg | PREFILLED_SYRINGE | INTRA_ARTICULAR | Status: AC | PRN
  Administered 2021-05-21: 30 mg via INTRA_ARTICULAR

## 2021-05-21 NOTE — Progress Notes (Signed)
? ?  Procedure Note ? ?Patient: Hailey [name]             ?Date of Birth: [birth year]           ?MRN: [id number]             ?Visit Date: 05/21/2021 ?HPI Mrs. Hailey [name] returns today for her bilateral Orthovisc injections #3 of 3.  She states she can tell little bit of difference but not much in regards to pain in both knees.  She has had no adverse effects. ? ?Physical exam: Bilateral knees no abnormal warmth erythema or effusion ? ?Procedures: ?Visit Diagnoses:  ?1. Unilateral primary osteoarthritis, left knee   ?2. Unilateral primary osteoarthritis, right knee   ? ? ?Large Joint Inj: bilateral knee on 05/21/2021 3:20 PM ?Indications: pain ?Details: 22 G 1.5 in needle, anterolateral approach ? ?Arthrogram: No ? ?Medications (Right): 30 mg Hyaluronan 30 MG/2ML ?Medications (Left): 30 mg Hyaluronan 30 MG/2ML ?Outcome: tolerated well, no immediate complications ?Procedure, treatment alternatives, risks and benefits explained, specific risks discussed. Consent was given by the patient. Immediately prior to procedure a time out was called to verify the correct patient, procedure, equipment, support staff and site/side marked as required. Patient was prepped and draped in the usual sterile fashion.  ? ? ?Plan: She will follow-up with [place] as needed.  She understands to wait at least 6 months between injections.  Questions encouraged and answered at length. ? ? ?

## 2021-07-18 ENCOUNTER — Other Ambulatory Visit: Payer: Self-pay | Admitting: Obstetrics & Gynecology

## 2021-07-18 DIAGNOSIS — Z1231 Encounter for screening mammogram for malignant neoplasm of breast: Secondary | ICD-10-CM

## 2021-08-09 ENCOUNTER — Ambulatory Visit: Payer: BC Managed Care – PPO | Admitting: Physician Assistant

## 2021-08-09 ENCOUNTER — Encounter: Payer: Self-pay | Admitting: Physician Assistant

## 2021-08-09 DIAGNOSIS — M7061 Trochanteric bursitis, right hip: Secondary | ICD-10-CM | POA: Diagnosis not present

## 2021-08-09 MED ORDER — LIDOCAINE HCL 1 % IJ SOLN
3.0000 mL | INTRAMUSCULAR | Status: AC | PRN
  Administered 2021-08-09: 3 mL

## 2021-08-09 MED ORDER — METHYLPREDNISOLONE ACETATE 40 MG/ML IJ SUSP
40.0000 mg | INTRAMUSCULAR | Status: AC | PRN
  Administered 2021-08-09: 40 mg via INTRA_ARTICULAR

## 2021-09-06 ENCOUNTER — Ambulatory Visit (INDEPENDENT_AMBULATORY_CARE_PROVIDER_SITE_OTHER): Payer: BC Managed Care – PPO

## 2021-09-06 DIAGNOSIS — Z1231 Encounter for screening mammogram for malignant neoplasm of breast: Secondary | ICD-10-CM

## 2021-11-01 ENCOUNTER — Encounter: Payer: Self-pay | Admitting: Obstetrics & Gynecology

## 2021-11-01 ENCOUNTER — Ambulatory Visit (INDEPENDENT_AMBULATORY_CARE_PROVIDER_SITE_OTHER): Payer: BC Managed Care – PPO | Admitting: Obstetrics & Gynecology

## 2021-11-01 VITALS — BP 112/74 | HR 83 | Ht 62.75 in | Wt 134.0 lb

## 2021-11-01 DIAGNOSIS — Z90710 Acquired absence of both cervix and uterus: Secondary | ICD-10-CM | POA: Diagnosis not present

## 2021-11-01 DIAGNOSIS — Z780 Asymptomatic menopausal state: Secondary | ICD-10-CM | POA: Diagnosis not present

## 2021-11-01 DIAGNOSIS — Z01419 Encounter for gynecological examination (general) (routine) without abnormal findings: Secondary | ICD-10-CM

## 2021-11-01 MED ORDER — ALPRAZOLAM 0.25 MG PO TABS: ORAL_TABLET | ORAL | 0 refills | Status: DC

## 2021-11-01 NOTE — Progress Notes (Signed)
Hailey [name] November 23, 1960 [id number]   History:    61 y.o. G2P1011    RP:  Established patient presenting for annual gyn exam    HPI: S/P TAH.  Postmenopause, well on no HRT.  No current pelvic pain.  Pap Neg 10/2019.  Food intolerances with mild Diverticulosis, followed by Laurette Schimke.  Colono 2022 benign polyps, repeat in 5 yrs.  Breasts normal.  Mammo Neg 08/2021. Urine normal.  Fasting health labs with Fam MD.  BMI 23.93.  Good fitness and healthy nutrition.  BD Normal  12/2019. Pt with anxiety, requests a refill of xanax.  Past medical history,surgical history, family history and social history were all reviewed and documented in the EPIC chart.  Gynecologic History Patient's last menstrual period was 01/11/2002.  Obstetric History OB History  Gravida Para Term Preterm AB Living  2 1 1   1 1   SAB IAB Ectopic Multiple Live Births  1            # Outcome Date GA Lbr Len/2nd Weight Sex Delivery Anes PTL Lv  2 SAB           1 Term              ROS: A ROS was performed and pertinent positives and negatives are included in the history. GENERAL: No fevers or chills. HEENT: No change in vision, no earache, sore throat or sinus congestion. NECK: No pain or stiffness. CARDIOVASCULAR: No chest pain or pressure. No palpitations. PULMONARY: No shortness of breath, cough or wheeze. GASTROINTESTINAL: No abdominal pain, nausea, vomiting or diarrhea, melena or bright red blood per rectum. GENITOURINARY: No urinary frequency, urgency, hesitancy or dysuria. MUSCULOSKELETAL: No joint or muscle pain, no back pain, no recent trauma. DERMATOLOGIC: No rash, no itching, no lesions. ENDOCRINE: No polyuria, polydipsia, no heat or cold intolerance. No recent change in weight. HEMATOLOGICAL: No anemia or easy bruising or bleeding. NEUROLOGIC: No headache, seizures, numbness, tingling or weakness. PSYCHIATRIC: No depression, no loss of interest in normal activity or change in sleep pattern.     Exam:   BP  112/74   Pulse 83   Ht 5' 2.75" (1.594 m)   Wt 134 lb (60.8 kg)   LMP 01/11/2002   SpO2 99%   BMI 23.93 kg/m   Body mass index is 23.93 kg/m.  General appearance : Well developed well nourished female. No acute distress HEENT: Eyes: no retinal hemorrhage or exudates,  Neck supple, trachea midline, no carotid bruits, no thyroidmegaly Lungs: Clear to auscultation, no rhonchi or wheezes, or rib retractions  Heart: Regular rate and rhythm, no murmurs or gallops Breast:Examined in sitting and supine position were symmetrical in appearance, no palpable masses or tenderness,  no skin retraction, no nipple inversion, no nipple discharge, no skin discoloration, no axillary or supraclavicular lymphadenopathy Abdomen: no palpable masses or tenderness, no rebound or guarding Extremities: no edema or skin discoloration or tenderness  Pelvic: Vulva: Normal             Vagina: No gross lesions or discharge  Cervix/Uterus absent  Adnexa  Without masses or tenderness  Anus: Normal   Assessment/Plan:  61 y.o. female for annual exam   1. Well female exam with routine gynecological exam S/P TAH.  Postmenopause, well on no HRT.  No current pelvic pain.  Pap Neg 10/2019.  Food intolerances with mild Diverticulosis, followed by 11/2019.  Colono 2022 benign polyps, repeat in 5 yrs. Breasts normal.  Mammo Neg 08/2021. Urine  normal.  Fasting health labs with Fam MD.  BMI 23.93.  Good fitness and healthy nutrition.  BD Normal  12/2019. Pt with anxiety, requests a refill of xanax.  2. S/P TAH (total abdominal hysterectomy)  3. Postmenopause S/P TAH.  Postmenopause, well on no HRT.  No current pelvic pain. BD Normal in 12/2019.  Other orders - ALPRAZolam (XANAX) 0.25 MG tablet; TAKE 1 TABLET BY MOUTH EVERY 8 HOURS AS NEEDED FOR ANXIETY   Genia Del MD, 3:42 PM 11/01/2021

## 2021-11-29 ENCOUNTER — Ambulatory Visit (INDEPENDENT_AMBULATORY_CARE_PROVIDER_SITE_OTHER): Payer: BC Managed Care – PPO

## 2021-11-29 DIAGNOSIS — Z23 Encounter for immunization: Secondary | ICD-10-CM | POA: Diagnosis not present

## 2021-11-29 NOTE — Progress Notes (Signed)
Flu Vaccination given IM right deltoid.  Patient tolerated injection well.

## 2021-12-24 ENCOUNTER — Ambulatory Visit: Payer: BC Managed Care – PPO | Admitting: Physician Assistant

## 2022-01-07 ENCOUNTER — Encounter: Payer: Self-pay | Admitting: Physician Assistant

## 2022-01-07 ENCOUNTER — Ambulatory Visit: Payer: BC Managed Care – PPO | Admitting: Physician Assistant

## 2022-01-07 DIAGNOSIS — M7061 Trochanteric bursitis, right hip: Secondary | ICD-10-CM

## 2022-01-07 MED ORDER — METHYLPREDNISOLONE ACETATE 40 MG/ML IJ SUSP
40.0000 mg | INTRAMUSCULAR | Status: AC | PRN
  Administered 2022-01-07: 40 mg via INTRA_ARTICULAR

## 2022-01-07 MED ORDER — LIDOCAINE HCL 1 % IJ SOLN
3.0000 mL | INTRAMUSCULAR | Status: AC | PRN
  Administered 2022-01-07: 3 mL

## 2022-01-07 NOTE — Progress Notes (Signed)
   Procedure Note  Patient: Hailey [name]             Date of Birth: 1960/05/14           MRN: [id number]             Visit Date: 01/07/2022 HPI: [name] comes in today requesting right hip injection.  She last had a stroke injection on 08/09/2021.  She states the pain returned a month ago.  However pain is now also going down into her right upper lower leg.  She denies any numbness tingling.  Pain is worse when she lies on the hip at night but she also notes the pain in her upper and lower leg when she is seated for prolonged period of time.  She has been using Voltaren gel and is taking gabapentin which helps some.  She denies any back pain.  She has had no new injury.  History of left sided sciatica in 2020 in which she underwent epidural steroid injection by Dr. Alvester Morin this was a foraminal injection at L5-S1.  She states that she is having no radicular symptoms down the left leg.  Physical exam: General well-developed well-nourished female no acute distress. Psych: Alert and oriented x3. Lower extremities.  Negative straight leg raise bilaterally.  Good range of motion bilateral hips without pain.  Tenderness over the right trochanteric region.  Nontender over the distal IT band or over the lateral aspect of the right knee.  No abnormal warmth erythema or effusion right knee.  Procedures: Visit Diagnoses:  1. Trochanteric bursitis, right hip     Large Joint Inj on 01/07/2022 11:31 AM Indications: pain Details: 22 G 1.5 in needle, lateral approach  Arthrogram: No  Medications: 3 mL lidocaine 1 %; 40 mg methylPREDNISolone acetate 40 MG/ML Outcome: tolerated well, no immediate complications Procedure, treatment alternatives, risks and benefits explained, specific risks discussed. Consent was given by the patient. Immediately prior to procedure a time out was called to verify the correct patient, procedure, equipment, support staff and site/side marked as required. Patient was prepped and  draped in the usual sterile fashion.     Plan: We will see how she does with the trochanteric injection.  She continues to have radicular symptoms down the right leg she may require an MRI to evaluate for foraminal stenosis as the source of her radicular symptoms.  She will continue to work on her core and stretching which she has a Systems analyst for.  She will follow-up with [place] as needed pain persist or becomes worse

## 2022-02-28 ENCOUNTER — Ambulatory Visit: Payer: BC Managed Care – PPO | Admitting: Physician Assistant

## 2022-03-18 ENCOUNTER — Encounter: Payer: Self-pay | Admitting: Physician Assistant

## 2022-03-18 ENCOUNTER — Ambulatory Visit (INDEPENDENT_AMBULATORY_CARE_PROVIDER_SITE_OTHER): Payer: BC Managed Care – PPO

## 2022-03-18 ENCOUNTER — Ambulatory Visit: Payer: BC Managed Care – PPO | Admitting: Physician Assistant

## 2022-03-18 DIAGNOSIS — M25551 Pain in right hip: Secondary | ICD-10-CM

## 2022-03-18 DIAGNOSIS — M25511 Pain in right shoulder: Secondary | ICD-10-CM

## 2022-03-18 MED ORDER — METHYLPREDNISOLONE ACETATE 40 MG/ML IJ SUSP
40.0000 mg | INTRAMUSCULAR | Status: AC | PRN
  Administered 2022-03-18: 40 mg via INTRA_ARTICULAR

## 2022-03-18 MED ORDER — LIDOCAINE HCL 1 % IJ SOLN
3.0000 mL | INTRAMUSCULAR | Status: AC | PRN
  Administered 2022-03-18: 3 mL

## 2022-03-18 NOTE — Progress Notes (Signed)
Office Visit Note   Patient: Hailey [name]           Date of Birth: [birth year]           MRN: [id number] Visit Date: 03/18/2022              Requested by: Marda Stalker, PA-C [place],  [place] [address] PCP: Marda Stalker, PA-C   Assessment & Plan: Visit Diagnoses:  1. Pain of right hip   2. Acute pain of right shoulder     Plan: Will have her wean off of her gabapentin.  She will begin taking Aleve twice daily for 2 weeks.  Then she can take Aleve as needed.  She is given a prescription for physical therapy to work on her right shoulder for range of motion strengthening home exercise and modalities.  In regards to her right thigh they will work on IT band stretching.  Also work on core strengthening hamstring stretching back exercises, home exercise program and modalities.  See her back in 6 weeks to see how she is doing overall.  Questions were encouraged and answered at length.  Follow-Up Instructions: Return in about 6 weeks (around 04/29/2022).   Orders:  Orders Placed This Encounter  Procedures   Large Joint Inj: R greater trochanter   Large Joint Inj: R subacromial bursa   XR Shoulder Right   XR HIP UNILAT W OR W/O PELVIS 2-3 VIEWS RIGHT   No orders of the defined types were placed in this encounter.     Procedures: Large Joint Inj: R greater trochanter on 03/18/2022 11:09 AM Indications: pain Details: 22 G 1.5 in needle, lateral approach  Arthrogram: No  Medications: 3 mL lidocaine 1 %; 40 mg methylPREDNISolone acetate 40 MG/ML Outcome: tolerated well, no immediate complications Procedure, treatment alternatives, risks and benefits explained, specific risks discussed. Consent was given by the patient. Immediately prior to procedure a time out was called to verify the correct patient, procedure, equipment, support staff and site/side marked as required. Patient was prepped and draped in the usual sterile fashion.    Large Joint Inj: R  subacromial bursa on 03/18/2022 11:09 AM Indications: pain Details: 22 G 1.5 in needle, superior approach  Arthrogram: No  Medications: 3 mL lidocaine 1 %; 40 mg methylPREDNISolone acetate 40 MG/ML Outcome: tolerated well, no immediate complications Procedure, treatment alternatives, risks and benefits explained, specific risks discussed. Consent was given by the patient. Immediately prior to procedure a time out was called to verify the correct patient, procedure, equipment, support staff and site/side marked as required. Patient was prepped and draped in the usual sterile fashion.       Clinical Data: No additional findings.   Subjective: Chief Complaint  Patient presents with   Right Shoulder - Pain   Right Hip - Pain    HPI Mrs. Games returns today for right hip pain.  She states that the last injection gave her relief for about 2 weeks.  And her pain came back to the lateral aspect of the thigh.  She notes occasional radicular symptoms that feel like a popping and releasing sensation anterior right thigh down to the lateral aspect of her calf.  She denies any back pain no real numbness tingling.  She has pain whenever she rolls over on the right hip.  No significant groin pain.  She has had no new injury.  She has been taking her gabapentin again since December and is unsure if this is  helping.  She is using magnesium and CBD cream.  Also diclofenac gel.  She is working with a Physiological scientist and doing stretching along with the IT band stretching exercises she was shown.  She also has a new complaint of right shoulder pain that began in December.  She notes weakness especially with reaching behind her.  No injury.  No numbness tingling down the arm. Review of Systems Negative for fevers or chills.  Please see HPI otherwise negative or noncontributory  Objective: Vital Signs: LMP 01/11/2002   Physical Exam Constitutional:      Appearance: She is not ill-appearing or  diaphoretic.  Pulmonary:     Effort: Pulmonary effort is normal.  Neurological:     Mental Status: She is alert and oriented to person, place, and time.  Psychiatric:        Mood and Affect: Mood normal.     Ortho Exam Bilateral shoulders: Full range of motion bilateral shoulders.  She has full overhead activity active bilateral shoulders.  Slight weakness with external rotation against resistance right shoulder.  Liftoff test is slightly weak also on the right.  Impingement testing is positive on the right.  Right shoulder abduction and empty can test is negative.  Left shoulder exam is normal.  Bilateral hips good range of motion both hips without pain.  Tenderness over the right hip trochanteric region.  5 out of 5 strength throughout the lower extremities against resistance negative straight leg raise bilaterally.  She lacks being able to touch toes with flexion of the lumbar spine approximately 2 inches.  She has good extension lumbar spine.  No tenderness over the spinal column or paraspinous region of the lumbar spine. Specialty Comments:  No specialty comments available.  Imaging: XR HIP UNILAT W OR W/O PELVIS 2-3 VIEWS RIGHT  Result Date: 03/18/2022 AP pelvis lateral view of the right hip: Bilateral hips well located on the AP view.  No acute fractures or acute findings.  Hip joint on the right is well-maintained.  XR Shoulder Right  Result Date: 03/18/2022 Right shoulder 3 views: Shoulder is well located.  No acute fractures.  Glenohumeral joints well-maintained.  Moderate AC joint changes.  No other bony abnormalities.  Subacromial space well-maintained.    PMFS History: Patient Active Problem List   Diagnosis Date Noted   Unilateral primary osteoarthritis, left knee 08/04/2017   Unilateral primary osteoarthritis, right knee 08/04/2017   Chronic pain of left knee 11/04/2016   Chronic pain of right knee 11/04/2016   Trochanteric bursitis, right hip 07/17/2016   Right hand  pain 04/14/2016   ACUTE PHARYNGITIS 12/23/2008   ALLERGIC RHINITIS 12/23/2008   Past Medical History:  Diagnosis Date   COVID 03/2020   Endometriosis    GERD (gastroesophageal reflux disease)    Hyperprolactinemia ([employer])    MRI 1992, 2001 AND 2003. LAST MRI NORMAL.   Psoriasis     Family History  Problem Relation Age of Onset   Breast cancer Mother 81   ALS Mother    Cancer Father        PAROTID GLAND   Hypertension Father    Breast cancer Maternal Aunt 71   Heart failure Maternal Aunt    Cancer Maternal Aunt        Lymphoma   Heart disease Maternal Grandmother    Heart disease Paternal Grandmother    Heart failure Maternal Uncle     Past Surgical History:  Procedure Laterality Date   BUNIONECTOMY  DILATION AND CURETTAGE OF UTERUS  2002   AT TIME OF HYSTEROSCOPY   FOOT SURGERY     spurs   HYSTEROSCOPY  2002   AT TIME OF DIAG LAP A HYSTEROSCOPY, D&C WAS DONE   IT band surg     MOUTH SURGERY     PELVIC LAPAROSCOPY  2002   DIAG LAP W LASER DONE WITH HYSTEROSCOPY,D&C   TOTAL ABDOMINAL HYSTERECTOMY  01/11/02   Social History   Occupational History   Not on file  Tobacco Use   Smoking status: Never   Smokeless tobacco: Never  Vaping Use   Vaping Use: Never used  Substance and Sexual Activity   Alcohol use: Yes    Comment: 0-2 month   Drug use: No   Sexual activity: Yes    Partners: Male    Birth control/protection: Surgical    Comment: HYST-1st intercourse 18 yo-5 partners

## 2022-04-25 ENCOUNTER — Encounter: Payer: Self-pay | Admitting: Radiology

## 2022-05-06 ENCOUNTER — Other Ambulatory Visit: Payer: Self-pay

## 2022-05-06 ENCOUNTER — Encounter: Payer: Self-pay | Admitting: Orthopaedic Surgery

## 2022-05-06 ENCOUNTER — Ambulatory Visit: Payer: BC Managed Care – PPO | Admitting: Orthopaedic Surgery

## 2022-05-06 DIAGNOSIS — M25551 Pain in right hip: Secondary | ICD-10-CM

## 2022-05-06 DIAGNOSIS — M7061 Trochanteric bursitis, right hip: Secondary | ICD-10-CM | POA: Diagnosis not present

## 2022-05-06 DIAGNOSIS — M5431 Sciatica, right side: Secondary | ICD-10-CM

## 2022-05-06 NOTE — Progress Notes (Signed)
The patient is a 62 year old female well-known to [place].  She returns in follow-up after having physical therapy on her right hip trochanteric area and IT band as well as her low back.  She has remote history of an IT band release on the left hip done by one of my colleagues in town.  She said physical therapy helped recently on her shoulder but had no effect on her right hip or her low back.  She has even had dry needling during this therapy sessions.  She has worked on activity modification and rest.  She is a young appearing 62 years old and she is thin.  Her right hip shows significant pain over the trochanteric area to palpation and on rotation.  There is also some sciatic pain and pain in the low back to the right side.  There is pain with a positive straight leg raise and some numbness and tingling and burning sensation on the lateral aspect of her right leg.  Her right knee and right hip exam in terms of the groin and the right knee joint are normal.  Her x-rays are also normal of her pelvis and right hip.  Her right shoulder is now asymptomatic and has done very well.  She moves that shoulder easily.  This point a MRI of the right hip is warranted to assess the abductor tendons and the bursa around the trochanteric area.  A MRI of the lumbar spine is also warranted to rule out nerve compression to the right side.  Hopefully these can be done in [place] where she has had MRIs before through the [facility] system.  We will see her back after these MRIs.  She agrees with this treatment plan.  All questions and concerns were addressed and answered.

## 2022-05-18 ENCOUNTER — Ambulatory Visit (INDEPENDENT_AMBULATORY_CARE_PROVIDER_SITE_OTHER): Payer: BC Managed Care – PPO

## 2022-05-18 DIAGNOSIS — M25551 Pain in right hip: Secondary | ICD-10-CM | POA: Diagnosis not present

## 2022-05-18 DIAGNOSIS — M5431 Sciatica, right side: Secondary | ICD-10-CM | POA: Diagnosis not present

## 2022-05-18 DIAGNOSIS — M7061 Trochanteric bursitis, right hip: Secondary | ICD-10-CM

## 2022-05-18 DIAGNOSIS — M549 Dorsalgia, unspecified: Secondary | ICD-10-CM

## 2022-05-29 ENCOUNTER — Encounter: Payer: Self-pay | Admitting: Orthopaedic Surgery

## 2022-05-29 ENCOUNTER — Ambulatory Visit: Payer: BC Managed Care – PPO | Admitting: Orthopaedic Surgery

## 2022-05-29 DIAGNOSIS — M25551 Pain in right hip: Secondary | ICD-10-CM

## 2022-05-29 NOTE — Progress Notes (Signed)
The patient comes in today to go over MRI of the right hip and her lumbar spine.  She has been having significant right hip and groin pain and lateral hip pain on the right side.  She is also had a burning sensation going down her right leg.  She is not a diabetic.  She remotely had a intervention in her lumbar spine to the left side by Dr. Alvester Morin years ago.  She has not had any type of steroid injection in her right hip but she has been to physical therapy for her hip and her back.  On exam her right hip moves smoothly and fluidly with no blocks to rotation.  Most of her pain seems to be on the lateral aspect of the hip but it does radiate into the groin.  Her right knee exam is normal.  She does not have a positive straight leg raise.  She is a thin individual.  She reports burning down the lateral aspect of her right leg.  The MRI of her lumbar spine shows some mild degenerative changes of the lower lumbar spine but no canal or foraminal stenosis or impingement.  The MRI of her right hip she is potentially a small degenerative superior labral tear but this is really unremarkable when I look at it and there is no cartilage irregularities at all or edema in the acetabulum or the femoral head.  There is no fluid changes around the trochanteric area and the hip abductors appear normal with no tear.  I would like to send her to Dr. Shon Baton to see if he could evaluate her right hip and even provide potentially diagnostic and therapeutic steroid injection under ultrasound in her right hip joint.  She agrees with this referral.  Of note she does have some mild hydronephrosis that was seen and she does have follow-up with urology.

## 2022-06-04 ENCOUNTER — Ambulatory Visit: Payer: BC Managed Care – PPO | Admitting: Sports Medicine

## 2022-06-13 ENCOUNTER — Other Ambulatory Visit: Payer: Self-pay

## 2022-06-13 ENCOUNTER — Ambulatory Visit: Payer: BC Managed Care – PPO | Admitting: Sports Medicine

## 2022-06-13 ENCOUNTER — Encounter: Payer: Self-pay | Admitting: Sports Medicine

## 2022-06-13 DIAGNOSIS — M5431 Sciatica, right side: Secondary | ICD-10-CM

## 2022-06-13 DIAGNOSIS — M25551 Pain in right hip: Secondary | ICD-10-CM

## 2022-06-13 MED ORDER — LIDOCAINE HCL 1 % IJ SOLN
4.0000 mL | INTRAMUSCULAR | Status: AC | PRN
Start: 2022-06-13 — End: 2022-06-13
  Administered 2022-06-13: 4 mL

## 2022-06-13 MED ORDER — METHYLPREDNISOLONE ACETATE 40 MG/ML IJ SUSP
80.0000 mg | INTRAMUSCULAR | Status: AC | PRN
Start: 2022-06-13 — End: 2022-06-13
  Administered 2022-06-13: 80 mg via INTRA_ARTICULAR

## 2022-06-13 NOTE — Progress Notes (Signed)
Hailey [name] - 62 y.o. female MRN [id number]  Date of birth: 1960-10-16  Office Visit Note: Visit Date: 06/13/2022 PCP: Jarrett Soho, PA-C Referred by: Jarrett Soho, PA-C  Subjective: Chief Complaint  Patient presents with   Right Hip - Pain   HPI: Hailey [name] is a pleasant 62 y.o. female who presents today for chronic right hip pain.   She has had chronic right hip pain over the lateral hip anterior hip and sometimes into the groin.  She also notices a burning sensation over the lateral hip, but is more so present with burning lateral to the knee and down the calf.  She has had both an MRI of the hip and the low back that Dr. Magnus Ivan went through with her.  Presents today for hip evaluation and consideration of diagnostic injection.  Pertinent ROS were reviewed with the patient and found to be negative unless otherwise specified above in HPI.   Assessment & Plan: Visit Diagnoses:  1. Pain of right hip   2. Sciatica, right side    Plan: I did evaluate Keller's hip first under ultrasound -I could see a likely degenerative small tear of the superior anterior labrum, however the hip joint looks benign without any hip effusion.  She has some chronic appearing calcification off the insertional rectus femoris, although this is nonacute and does not bother her provocatively so I do not think this is the origin of her pain.  Her lumbar MRI is rather benign, possibly some disc bulge over the extraforaminal space on the right at L4-L5 but no significant neural impingement.  However her symptoms are most indicative of radiculopathy.  I did discuss this with her, and gave her the option of working the back up first with either injection or nerve conduction study/EMG versus trying an intra-articular hip injection for diagnostic purposes.  She agreed to proceed with this.  We did perform an intra-articular hip injection today, she will see how this does over the next 1-2 weeks.  If this  significantly helps her pain, likely her source is the hip.  If it does not, I do think it may be wise to ascertain radicular symptoms coming from the back and possibly send her to Dr. Alvester Morin.  I would like her to discuss this with Dr. Magnus Ivan at the 2-week mark from her injection.  Follow-up: Return for Will notify Dr. Mallie Mussel in 2 weeks relief from hip injection.   Meds & Orders: No orders of the defined types were placed in this encounter.   Orders Placed This Encounter  Procedures   Large Joint Inj   [place] Extrem Low Right Ltd     Procedures: Large Joint Inj: R hip joint on 06/13/2022 4:43 PM Indications: pain and diagnostic evaluation Details: 22 G 3.5 in needle, ultrasound-guided anterior approach Medications: 4 mL lidocaine 1 %; 80 mg methylPREDNISolone acetate 40 MG/ML Outcome: tolerated well, no immediate complications  Procedure: US-guided intra-articular hip injection, Right After discussion on risks/benefits/indications and informed verbal consent was obtained, a timeout was performed. Patient was lying supine on exam table. The hip was cleaned with betadine and alcohol swabs. Then utilizing ultrasound guidance, the patient's femoral head and neck junction was identified and subsequently injected with 4:2 lidocaine:depomedrol via an in-plane approach with ultrasound visualization of the injectate administered into the hip joint. Patient tolerated procedure well without immediate complications.  Procedure, treatment alternatives, risks and benefits explained, specific risks discussed. Consent was given by the patient. Immediately prior to  procedure a time out was called to verify the correct patient, procedure, equipment, support staff and site/side marked as required. Patient was prepped and draped in the usual sterile fashion.          Clinical History: No specialty comments available.  She reports that she has never smoked. She has never used smokeless tobacco. No  results for input(s): "HGBA1C", "LABURIC" in the last 8760 hours.  Objective:   Vital Signs: LMP 01/11/2002   Physical Exam  Gen: Well-appearing, in no acute distress; non-toxic CV:  Well-perfused. Warm.  Resp: Breathing unlabored on room air; no wheezing. Psych: Fluid speech in conversation; appropriate affect; normal thought process Neuro: Sensation intact throughout. No gross coordination deficits.   Ortho Exam - Right hip: No redness or swelling.  The hip moves fluidly with internal and external passive logroll.  Very mild TTP of bilateral greater trochanters.  Negative Stinchfield test.  Strength 5/5 throughout.  Negative FADIR testing, FABER testing does reproduce some pain of the lateral hip.  Imaging: [place] Extrem Low Right Ltd  Result Date: 06/13/2022 Limited ultrasound of the right lower extremity, right hip was performed today.  Evaluation of the right hip joint showed good curvature of the femoral head and neck junction without hip effusion.  Scanning the acetabulum and labrum there is some irregularity of the superior aspect of the anterior labrum, suggestive of labral tearing/degeneration.  Evaluation of the ASIS in short and long axis shows proper insertion of the sartorius muscle without cortical regularity.  The AIIS does show calcification near the insertional aspect of the rectus femoris tendon, indicative of likely prior injury/partial tearing.  There is no hyperemia noted around this without evidence of acute abnormality.  Greater trochanter visualized without subgluteal bursitis.   Narrative & Impression  CLINICAL DATA:  Right hip pain and back pain for several years, no known injury   EXAM: MR OF THE RIGHT HIP WITHOUT CONTRAST   TECHNIQUE: Multiplanar, multisequence MR imaging was performed. No intravenous contrast was administered.   COMPARISON:  None Available.   FINDINGS: Bones:   No hip fracture, dislocation or avascular necrosis.   No periosteal reaction  or bone destruction. No aggressive osseous lesion.   Normal sacrum and sacroiliac joints. No SI joint widening or erosive changes.   Degenerative disease with disc height loss at L3-4 and L4-5.   Articular cartilage and labrum   Articular cartilage:  No chondral defect.   Labrum: Right labral degeneration with a probable small superior anterior labral tear.   Joint or bursal effusion   Joint effusion:  No hip joint effusion.  No SI joint effusion.   Bursae: Small amount of fluid in the greater trochanter bursa bilaterally.   Muscles and tendons   Flexors: Normal.   Extensors: Normal.   Abductors: Normal.   Adductors: Normal.   Gluteals: Normal.   Hamstrings: Partial-thickness tear of the right hamstring origin. Tiny interstitial tear of the left hamstring origin.   Other findings   No pelvic free fluid. No fluid collection or hematoma. No inguinal lymphadenopathy. No inguinal hernia.   IMPRESSION: 1. No acute osseous injury of the right hip and pelvis. 2. Right labral degeneration with a probable small superior anterior labral tear. 3. Partial-thickness tear of the right hamstring origin. 4. Tiny interstitial tear of the left hamstring origin.     Electronically Signed   By: Elige Ko M.D.   On: 05/21/2022 06:46      Narrative & Impression  CLINICAL DATA:  Right hip and back pain for several years.   EXAM: MRI LUMBAR SPINE WITHOUT CONTRAST   TECHNIQUE: Multiplanar, multisequence MR imaging of the lumbar spine was performed. No intravenous contrast was administered.   COMPARISON:  05/15/2018   FINDINGS: Segmentation:  Standard.   Alignment:  Slight levocurvature   Vertebrae: No fracture, evidence of discitis, or bone lesion. Mild dysmorphism of the L3 posterior body.   Conus medullaris and cauda equina: Conus extends to the L1-2 level. Conus and cauda equina appear normal.   Paraspinal and other soft tissues: Mild right hydronephrosis  and hydroureter not seen on comparison study or on abdominal MRI 01/15/2021.   Disc levels:   T12- L1: Unremarkable.   L1-L2: Mild disc bulging and left facet spurring   L2-L3: Mild disc bulging   L3-L4: Mild disc bulging   L4-L5: Disc narrowing and bulging with mild endplate degeneration. Mild facet spurring. No neural impingement.   L5-S1:Mild degenerative facet spurring. Disc height loss and bulging with mild endplate degeneration and small left foraminal protrusion. The canal and foramina remain patent.   IMPRESSION: 1. Noncompressive lumbar spine degeneration which appears similar to 2020. 2. Mild right hydroureteronephrosis since abdominal MRI 01/15/2021, recommend urology follow-up.     Electronically Signed   By: Tiburcio Pea M.D.   On: 05/21/2022 06:46    Past Medical/Family/Surgical/Social History: Medications & Allergies reviewed per EMR, new medications updated. Patient Active Problem List   Diagnosis Date Noted   Unilateral primary osteoarthritis, left knee 08/04/2017   Unilateral primary osteoarthritis, right knee 08/04/2017   Chronic pain of left knee 11/04/2016   Chronic pain of right knee 11/04/2016   Trochanteric bursitis, right hip 07/17/2016   Right hand pain 04/14/2016   ACUTE PHARYNGITIS 12/23/2008   ALLERGIC RHINITIS 12/23/2008   Past Medical History:  Diagnosis Date   COVID 03/2020   Endometriosis    GERD (gastroesophageal reflux disease)    Hyperprolactinemia    MRI 1992, 2001 AND 2003. LAST MRI NORMAL.   Psoriasis    Family History  Problem Relation Age of Onset   Breast cancer Mother 58   ALS Mother    Cancer Father        PAROTID GLAND   Hypertension Father    Breast cancer Maternal Aunt 29   Heart failure Maternal Aunt    Cancer Maternal Aunt        Lymphoma   Heart disease Maternal Grandmother    Heart disease Paternal Grandmother    Heart failure Maternal Uncle    Past Surgical History:  Procedure Laterality Date    BUNIONECTOMY     DILATION AND CURETTAGE OF UTERUS  2002   AT TIME OF HYSTEROSCOPY   FOOT SURGERY     spurs   HYSTEROSCOPY  2002   AT TIME OF DIAG LAP A HYSTEROSCOPY, D&C WAS DONE   IT band surg     MOUTH SURGERY     PELVIC LAPAROSCOPY  2002   DIAG LAP W LASER DONE WITH HYSTEROSCOPY,D&C   TOTAL ABDOMINAL HYSTERECTOMY  01/11/02   Social History   Occupational History   Not on file  Tobacco Use   Smoking status: Never   Smokeless tobacco: Never  Vaping Use   Vaping Use: Never used  Substance and Sexual Activity   Alcohol use: Yes    Comment: 0-2 month   Drug use: No   Sexual activity: Yes    Partners: Male    Birth control/protection: Surgical  Comment: HYST-1st intercourse 18 yo-5 partners

## 2022-07-07 ENCOUNTER — Encounter: Payer: Self-pay | Admitting: Orthopaedic Surgery

## 2022-07-08 NOTE — Telephone Encounter (Signed)
Hailey [name] should I schedule him with you for a office visit to discuss this?

## 2022-07-10 ENCOUNTER — Other Ambulatory Visit: Payer: Self-pay | Admitting: Physical Medicine and Rehabilitation

## 2022-07-10 DIAGNOSIS — M5416 Radiculopathy, lumbar region: Secondary | ICD-10-CM

## 2022-07-10 NOTE — Telephone Encounter (Signed)
Can you advise on this

## 2022-08-05 ENCOUNTER — Other Ambulatory Visit: Payer: Self-pay

## 2022-08-05 ENCOUNTER — Ambulatory Visit (INDEPENDENT_AMBULATORY_CARE_PROVIDER_SITE_OTHER): Payer: BC Managed Care – PPO | Admitting: Physical Medicine and Rehabilitation

## 2022-08-05 VITALS — BP 133/86 | HR 71

## 2022-08-05 DIAGNOSIS — M5416 Radiculopathy, lumbar region: Secondary | ICD-10-CM | POA: Diagnosis not present

## 2022-08-05 MED ORDER — METHYLPREDNISOLONE ACETATE 80 MG/ML IJ SUSP
80.0000 mg | Freq: Once | INTRAMUSCULAR | Status: AC
Start: 2022-08-05 — End: 2022-08-05
  Administered 2022-08-05: 80 mg

## 2022-08-05 NOTE — Progress Notes (Signed)
Functional Pain Scale - descriptive words and definitions  Unmanageable (7)  Pain interferes with normal ADL's/nothing seems to help/sleep is very difficult/active distractions are very difficult to concentrate on. Severe range order  Average Pain  varies, feels like "rubberbands popping in hip"   +Driver, -BT, -Dye Allergies.  Lower back pain on right side that radiates in the hip and groin and from the right knee to the ankle

## 2022-08-05 NOTE — Patient Instructions (Signed)

## 2022-08-06 ENCOUNTER — Other Ambulatory Visit: Payer: Self-pay | Admitting: Obstetrics & Gynecology

## 2022-08-06 DIAGNOSIS — Z1231 Encounter for screening mammogram for malignant neoplasm of breast: Secondary | ICD-10-CM

## 2022-08-14 NOTE — Progress Notes (Signed)
Hailey [name] - 62 y.o. female MRN [id number]  Date of birth: 12/14/1960  Office Visit Note: Visit Date: 08/05/2022 PCP: Jarrett Soho, PA-C Referred by: Jarrett Soho, PA-C  Subjective: Chief Complaint  Patient presents with   Lower Back - Pain   HPI:  Hailey [name] is a 62 y.o. female who comes in today at the request of Dr. Doneen Poisson for planned Right L5-S1 Lumbar Transforaminal epidural steroid injection with fluoroscopic guidance.  The patient has failed conservative care including home exercise, medications, time and activity modification.  This injection will be diagnostic and hopefully therapeutic.  Please see requesting physician notes for further details and justification. Most recent Lumbar MRI not revealing much right sided pathology that would correlate with symptoms.   ROS Otherwise per HPI.  Assessment & Plan: Visit Diagnoses:    ICD-10-CM   1. Lumbar radiculopathy  M54.16 XR C-ARM NO REPORT    Epidural Steroid injection    methylPREDNISolone acetate (DEPO-MEDROL) injection 80 mg      Plan: No additional findings.   Meds & Orders:  Meds ordered this encounter  Medications   methylPREDNISolone acetate (DEPO-MEDROL) injection 80 mg    Orders Placed This Encounter  Procedures   XR C-ARM NO REPORT   Epidural Steroid injection    Follow-up: Return for visit to requesting provider as needed.   Procedures: No procedures performed  Lumbosacral Transforaminal Epidural Steroid Injection - Sub-Pedicular Approach with Fluoroscopic Guidance  Patient: Hailey [name]      Date of Birth: [birth year] MRN: [id number] PCP: Jarrett Soho, PA-C      Visit Date: 08/05/2022   Universal Protocol:    Date/Time: 08/05/2022  Consent Given By: the patient  Position: PRONE  Additional Comments: Vital signs were monitored before and after the procedure. Patient was prepped and draped in the usual sterile fashion. The correct patient, procedure, and  site was verified.   Injection Procedure Details:   Procedure diagnoses: Lumbar radiculopathy [M54.16]    Meds Administered:  Meds ordered this encounter  Medications   methylPREDNISolone acetate (DEPO-MEDROL) injection 80 mg    Laterality: Right  Location/Site: L5  Needle:5.0 in., 22 ga.  Short bevel or Quincke spinal needle  Needle Placement: Transforaminal  Findings:    -Comments: Excellent flow of contrast along the nerve, nerve root and into the epidural space.  Procedure Details: After squaring off the end-plates to get a true AP view, the C-arm was positioned so that an oblique view of the foramen as noted above was visualized. The target area is just inferior to the "nose of the scotty dog" or sub pedicular. The soft tissues overlying this structure were infiltrated with 2-3 ml. of 1% Lidocaine without Epinephrine.  The spinal needle was inserted toward the target using a "trajectory" view along the fluoroscope beam.  Under AP and lateral visualization, the needle was advanced so it did not puncture dura and was located close the 6 O'Clock position of the pedical in AP tracterory. Biplanar projections were used to confirm position. Aspiration was confirmed to be negative for CSF and/or blood. A 1-2 ml. volume of Isovue-250 was injected and flow of contrast was noted at each level. Radiographs were obtained for documentation purposes.   After attaining the desired flow of contrast documented above, a 0.5 to 1.0 ml test dose of 0.25% Marcaine was injected into each respective transforaminal space.  The patient was observed for 90 seconds post injection.  After no sensory deficits were reported, and  normal lower extremity motor function was noted,   the above injectate was administered so that equal amounts of the injectate were placed at each foramen (level) into the transforaminal epidural space.   Additional Comments:  No complications occurred Dressing: 2 x 2 sterile gauze  and Band-Aid    Post-procedure details: Patient was observed during the procedure. Post-procedure instructions were reviewed.  Patient left the clinic in stable condition.    Clinical History: No specialty comments available.     Objective:  VS:  HT:    WT:   BMI:     BP:133/86  HR:71bpm  TEMP: ( )  RESP:  Physical Exam Vitals and nursing note reviewed.  Constitutional:      General: She is not in acute distress.    Appearance: Normal appearance. She is not ill-appearing.  HENT:     Head: Normocephalic and atraumatic.     Right Ear: External ear normal.     Left Ear: External ear normal.  Eyes:     Extraocular Movements: Extraocular movements intact.  Cardiovascular:     Rate and Rhythm: Normal rate.     Pulses: Normal pulses.  Pulmonary:     Effort: Pulmonary effort is normal. No respiratory distress.  Abdominal:     General: There is no distension.     Palpations: Abdomen is soft.  Musculoskeletal:        General: Tenderness present.     Cervical back: Neck supple.     Right lower leg: No edema.     Left lower leg: No edema.     Comments: Patient has good distal strength with no pain over the greater trochanters.  No clonus or focal weakness.  Skin:    Findings: No erythema, lesion or rash.  Neurological:     General: No focal deficit present.     Mental Status: She is alert and oriented to person, place, and time.     Sensory: No sensory deficit.     Motor: No weakness or abnormal muscle tone.     Coordination: Coordination normal.  Psychiatric:        Mood and Affect: Mood normal.        Behavior: Behavior normal.      Imaging: No results found.

## 2022-08-14 NOTE — Procedures (Signed)
Lumbosacral Transforaminal Epidural Steroid Injection - Sub-Pedicular Approach with Fluoroscopic Guidance  Patient: Hailey [name]      Date of Birth: 1960/04/15 MRN: [id number] PCP: Jarrett Soho, PA-C      Visit Date: 08/05/2022   Universal Protocol:    Date/Time: 08/05/2022  Consent Given By: the patient  Position: PRONE  Additional Comments: Vital signs were monitored before and after the procedure. Patient was prepped and draped in the usual sterile fashion. The correct patient, procedure, and site was verified.   Injection Procedure Details:   Procedure diagnoses: Lumbar radiculopathy [M54.16]    Meds Administered:  Meds ordered this encounter  Medications   methylPREDNISolone acetate (DEPO-MEDROL) injection 80 mg    Laterality: Right  Location/Site: L5  Needle:5.0 in., 22 ga.  Short bevel or Quincke spinal needle  Needle Placement: Transforaminal  Findings:    -Comments: Excellent flow of contrast along the nerve, nerve root and into the epidural space.  Procedure Details: After squaring off the end-plates to get a true AP view, the C-arm was positioned so that an oblique view of the foramen as noted above was visualized. The target area is just inferior to the "nose of the scotty dog" or sub pedicular. The soft tissues overlying this structure were infiltrated with 2-3 ml. of 1% Lidocaine without Epinephrine.  The spinal needle was inserted toward the target using a "trajectory" view along the fluoroscope beam.  Under AP and lateral visualization, the needle was advanced so it did not puncture dura and was located close the 6 O'Clock position of the pedical in AP tracterory. Biplanar projections were used to confirm position. Aspiration was confirmed to be negative for CSF and/or blood. A 1-2 ml. volume of Isovue-250 was injected and flow of contrast was noted at each level. Radiographs were obtained for documentation purposes.   After attaining the desired  flow of contrast documented above, a 0.5 to 1.0 ml test dose of 0.25% Marcaine was injected into each respective transforaminal space.  The patient was observed for 90 seconds post injection.  After no sensory deficits were reported, and normal lower extremity motor function was noted,   the above injectate was administered so that equal amounts of the injectate were placed at each foramen (level) into the transforaminal epidural space.   Additional Comments:  No complications occurred Dressing: 2 x 2 sterile gauze and Band-Aid    Post-procedure details: Patient was observed during the procedure. Post-procedure instructions were reviewed.  Patient left the clinic in stable condition.

## 2022-09-11 ENCOUNTER — Ambulatory Visit (INDEPENDENT_AMBULATORY_CARE_PROVIDER_SITE_OTHER): Payer: BC Managed Care – PPO

## 2022-09-11 DIAGNOSIS — Z1231 Encounter for screening mammogram for malignant neoplasm of breast: Secondary | ICD-10-CM

## 2022-09-24 ENCOUNTER — Encounter: Payer: Self-pay | Admitting: Orthopaedic Surgery

## 2022-10-07 ENCOUNTER — Encounter: Payer: Self-pay | Admitting: Orthopaedic Surgery

## 2022-10-07 ENCOUNTER — Ambulatory Visit: Payer: BC Managed Care – PPO | Admitting: Orthopaedic Surgery

## 2022-10-07 DIAGNOSIS — M25551 Pain in right hip: Secondary | ICD-10-CM | POA: Diagnosis not present

## 2022-10-07 NOTE — Progress Notes (Signed)
The patient is well-known to me.  She is 62 years old and thin.  She has been dealing with right hip pain for some time but there is also been a radicular component that radiates past the knee.  I have sent her for MRIs of the right hip and the right knee earlier this year.  The right hip MRI showed just some small degenerative area of the superior anterior labrum but this was very small.  There was no cartilage irregularity of the femoral head or the acetabulum.  The tendons around the hip appeared normal.  Also sent her for an MRI of her lumbar spine and this showed just some mild degenerative changes in the lumbar spine.  She did have a burning sensation that went down past her knee.  Since she has had intervention in her spine by Dr. Alvester Morin that has gone but she still has pain around her right hip on the lateral aspect but not in the groin.  It does radiate down the IT band.  On my exam today the extreme of external rotation does cause lateral hip pain.  Internal rotation does not seem to cause any pain.  Compression of the right hip causes no pain and there is no block to rotation and again no pain in the groin at all.  From my standpoint I would like to send her to my partner Dr. Steward Drone for his critical evaluation of her right hip to see if there is anything we are missing or anything else that he may be able to offer from that standpoint of her hip.  She does not walk with any type of Trendelenburg gait but this is a pain that does wake her up at night and continues to cause her discomfort.  She agrees with this referral.

## 2022-10-30 ENCOUNTER — Ambulatory Visit (INDEPENDENT_AMBULATORY_CARE_PROVIDER_SITE_OTHER): Payer: BC Managed Care – PPO | Admitting: Orthopaedic Surgery

## 2022-10-30 DIAGNOSIS — S76011A Strain of muscle, fascia and tendon of right hip, initial encounter: Secondary | ICD-10-CM | POA: Diagnosis not present

## 2022-10-30 NOTE — Progress Notes (Signed)
Chief Complaint: Right hip pain     History of Present Illness:    Hailey [name] is a 62 y.o. female presents today with ongoing lateral based hip pain which has been over the last several years.  She has been experiencing pain over the greater trochanter.  She has not had multiple injections in this area including the hip joint.  She is having a very difficult time laying directly on the side.  She does have a history of a left leg IT band release in 2015.  She is having a very hard time staying active and has done physical therapy as well as dry needling and multiple injections with both Dr. Shon Baton and Dr. Magnus Ivan.  She is here today for further discussion.    Surgical History:   None  PMH/PSH/Family History/Social History/Meds/Allergies:    Past Medical History:  Diagnosis Date   COVID 03/2020   Endometriosis    GERD (gastroesophageal reflux disease)    Hyperprolactinemia (HCC)    MRI 1992, 2001 AND 2003. LAST MRI NORMAL.   Psoriasis    Past Surgical History:  Procedure Laterality Date   BUNIONECTOMY     DILATION AND CURETTAGE OF UTERUS  2002   AT TIME OF HYSTEROSCOPY   FOOT SURGERY     spurs   HYSTEROSCOPY  2002   AT TIME OF DIAG LAP A HYSTEROSCOPY, D&C WAS DONE   IT band surg     MOUTH SURGERY     PELVIC LAPAROSCOPY  2002   DIAG LAP W LASER DONE WITH HYSTEROSCOPY,D&C   TOTAL ABDOMINAL HYSTERECTOMY  01/11/02   Social History   Socioeconomic History   Marital status: Married    Spouse name: Not on file   Number of children: Not on file   Years of education: Not on file   Highest education level: Not on file  Occupational History   Not on file  Tobacco Use   Smoking status: Never   Smokeless tobacco: Never  Vaping Use   Vaping status: Never Used  Substance and Sexual Activity   Alcohol use: Yes    Comment: 0-2 month   Drug use: No   Sexual activity: Yes    Partners: Male    Birth control/protection: Surgical     Comment: HYST-1st intercourse 26 yo-5 partners  Other Topics Concern   Not on file  Social History Narrative   Not on file   Social Determinants of Health   Financial Resource Strain: Low Risk  (09/04/2022)   Received from [facility]   Overall Financial Resource Strain (CARDIA)    Difficulty of Paying Living Expenses: Not hard at all  Food Insecurity: No Food Insecurity (09/04/2022)   Received from [facility]   Hunger Vital Sign    Worried About Running Out of Food in the Last Year: Never true    Ran Out of Food in the Last Year: Never true  Transportation Needs: No Transportation Needs (09/04/2022)   Received from [facility] - Transportation    Lack of Transportation (Medical): No    Lack of Transportation (Non-Medical): No  Physical Activity: Insufficiently Active (09/04/2022)   Received from [facility]   Exercise Vital Sign    Days of Exercise per Week: 3 days    Minutes of Exercise per  Session: 30 min  Stress: No Stress Concern Present (09/04/2022)   Received from [facility] - Occupational Stress Questionnaire    Feeling of Stress : Not at all  Social Connections: Socially Integrated (09/04/2022)   Received from [facility]   Social Network    How would you rate your social network (family, work, friends)?: Good participation with social networks   Family History  Problem Relation Age of Onset   Breast cancer Mother 24   ALS Mother    Cancer Father        PAROTID GLAND   Hypertension Father    Breast cancer Maternal Aunt 71   Heart failure Maternal Aunt    Cancer Maternal Aunt        Lymphoma   Heart disease Maternal Grandmother    Heart disease Paternal Grandmother    Heart failure Maternal Uncle    Allergies  Allergen Reactions   Codeine     REACTION: Itching   Current Outpatient Medications  Medication Sig Dispense Refill   albuterol (VENTOLIN HFA) 108 (90 Base) MCG/ACT inhaler       ALPRAZolam (XANAX) 0.25 MG tablet TAKE 1 TABLET BY MOUTH EVERY 8 HOURS AS NEEDED FOR ANXIETY 30 tablet 0   APPLE CIDER VINEGAR PO Take by mouth.     Apremilast (OTEZLA) 30 MG TABS 1 tablet     Ascorbic Acid (VITAMIN C PO) Take by mouth.       Azelastine HCl 137 MCG/SPRAY SOLN      B Complex Vitamins (VITAMIN-B COMPLEX PO) Take by mouth.       Calcium Carbonate-Vitamin D (CALCIUM + D PO) Take by mouth.       cetirizine (ZYRTEC) 10 MG tablet Take 10 mg by mouth daily.     Cholecalciferol (VITAMIN D3 PO) Take by mouth.     CINNAMON PO Take by mouth.     clobetasol cream (TEMOVATE) 0.05 % APP ON THE SKIN BID PRN  0   Cranberry 500 MG TABS Take by mouth.     cycloSPORINE (RESTASIS) 0.05 % ophthalmic emulsion 1 drop 2 (two) times daily.     diclofenac Sodium (VOLTAREN) 1 % GEL Apply 4 g topically 4 (four) times daily. 350 g 1   dicyclomine (BENTYL) 10 MG capsule Take by mouth. 60mg  daily     fluticasone (FLONASE) 50 MCG/ACT nasal spray      ipratropium (ATROVENT) 0.06 % nasal spray      MAGNESIUM PO Take by mouth.     Multiple Vitamins-Iron (MULTIVITAMIN/IRON PO) Take by mouth.       Multiple Vitamins-Minerals (ZINC PO) Take by mouth.     nystatin-triamcinolone ointment (MYCOLOG) Apply 1 application topically 2 (two) times daily. 30 g 0   Omega-3 Fatty Acids (FISH OIL PO) Take by mouth.       POTASSIUM PO Take by mouth.       Probiotic Product (PROBIOTIC-10 PO) Take by mouth.     Red Yeast Rice Extract (RED YEAST RICE PO) Take by mouth.     Turmeric 400 MG CAPS Take by mouth.     UNABLE TO FIND Liver care capsules Stress care caps [facility] - hot flushes shatavari - supplement     vitamin B-12 (CYANOCOBALAMIN) 100 MCG tablet      VITAMIN E PO Take by mouth.       No current facility-administered medications for this visit.   No results found.  Review of  Systems:   A ROS was performed including pertinent positives and negatives as documented in the HPI.  Physical Exam :    Constitutional: NAD and appears stated age Neurological: Alert and oriented Psych: Appropriate affect and cooperative Last menstrual period 01/11/2002.   Comprehensive Musculoskeletal Exam:    Inspection Right Left  Skin No atrophy or gross abnormalities appreciated No atrophy or gross abnormalities appreciated  Palpation    Tenderness None None  Crepitus None None  Range of Motion    Flexion (passive) 120 120  Extension 30 30  IR 30 30  ER 45 45  Strength    Flexion  5/5 5/5  Extension 5/5 5/5  Special Tests    FABER Negative Negative  FADIR Negative Negative  ER Lag/Capsular Insufficiency Negative Negative  Instability Negative Negative  Sacroiliac pain Negative  Negative   Instability    Generalized Laxity No No  Neurologic    sciatic, femoral, obturator nerves intact to light sensation  Vascular/Lymphatic    DP pulse 2+ 2+  Lumbar Exam    Patient has symmetric lumbar range of motion with negative pain referral to hip    Positive Trendelenburg on the right with tenderness over the greater trochanter and weakness with resisted abduction Imaging:   Xray (4 views right hip): Normal  MRI (right hip): Full-thickness gluteus medius tearing with some atrophy including the muscle belly although predominantly good Tellier grade 1 changes  I personally reviewed and interpreted the radiographs.   Assessment:   62 y.o. female with right hip pain in the setting of a full-thickness gluteus medius tear which is now failed multiple forms of therapy including physical therapy, injections, dry needling.  I did discuss that given the fact that she does have some fatty atrophy on the T1-weighted image that ultimately she would be a candidate for repair although we would recommend patch augmentation.  I discussed that I would consider surgery sooner rather than later as there is a risk of progression of the fatty atrophy particularly given the full-thickness tearing.  After discussion  of the risks and benefits she has ultimately elected for a right hip gluteus medius repair with collagen patch augmentation  Plan :    -Plan for right hip gluteus medius repair with collagen patch augmentation   After a lengthy discussion of treatment options, including risks, benefits, alternatives, complications of surgical and nonsurgical conservative options, the patient elected surgical repair.   The patient  is aware of the material risks  and complications including, but not limited to injury to adjacent structures, neurovascular injury, infection, numbness, bleeding, implant failure, thermal burns, stiffness, persistent pain, failure to heal, disease transmission from allograft, need for further surgery, dislocation, anesthetic risks, blood clots, risks of death,and others. The probabilities of surgical success and failure discussed with patient given their particular co-morbidities.The time and nature of expected rehabilitation and recovery was discussed.The patient's questions were all answered preoperatively.  No barriers to understanding were noted. I explained the natural history of the disease process and Rx rationale.  I explained to the patient what I considered to be reasonable expectations given their personal situation.  The final treatment plan was arrived at through a shared patient decision making process model.      I personally saw and evaluated the patient, and participated in the management and treatment plan.  [name], MD Attending Physician, Orthopedic Surgery  This document was dictated using Dragon voice recognition software. A reasonable attempt at proof reading has been  made to minimize errors.

## 2022-11-05 ENCOUNTER — Ambulatory Visit: Payer: BC Managed Care – PPO | Admitting: Obstetrics & Gynecology

## 2022-11-08 ENCOUNTER — Other Ambulatory Visit: Payer: Self-pay | Admitting: Obstetrics & Gynecology

## 2022-11-08 NOTE — Telephone Encounter (Signed)
Med refill request: Xanax Last AEX: 11/01/21 Next AEX: 12/03/22 Last MMG (if hormonal med) 09/11/22 Refill authorized: Please Advise, #[phone], 0 RF

## 2022-11-12 ENCOUNTER — Ambulatory Visit: Payer: BC Managed Care – PPO | Admitting: Nurse Practitioner

## 2022-11-15 ENCOUNTER — Other Ambulatory Visit (HOSPITAL_BASED_OUTPATIENT_CLINIC_OR_DEPARTMENT_OTHER): Payer: Self-pay | Admitting: Orthopaedic Surgery

## 2022-11-15 DIAGNOSIS — S76011A Strain of muscle, fascia and tendon of right hip, initial encounter: Secondary | ICD-10-CM

## 2022-12-03 ENCOUNTER — Encounter: Payer: Self-pay | Admitting: Nurse Practitioner

## 2022-12-03 ENCOUNTER — Ambulatory Visit (INDEPENDENT_AMBULATORY_CARE_PROVIDER_SITE_OTHER): Payer: BC Managed Care – PPO | Admitting: Nurse Practitioner

## 2022-12-03 VITALS — BP 108/62 | HR 70 | Resp 14 | Ht 63.75 in | Wt 138.0 lb

## 2022-12-03 DIAGNOSIS — Z780 Asymptomatic menopausal state: Secondary | ICD-10-CM

## 2022-12-03 DIAGNOSIS — Z01419 Encounter for gynecological examination (general) (routine) without abnormal findings: Secondary | ICD-10-CM | POA: Diagnosis not present

## 2022-12-03 NOTE — Progress Notes (Signed)
Hailey [name] [birth year] [id number]   History:  62 y.o. G2P1011 presents for annual exam without GYN complaints. S/P 2003 TAH for endometriosis. No HRT. Normal pap history. Having gluteal tendon repaired in November.   Gynecologic History Patient's last menstrual period was 01/11/2002.   Contraception/Family planning: status post hysterectomy Sexually active: Yes  Health Maintenance Last Pap: 10/29/2019. Results were: Normal Last mammogram: 09/11/2022. Results were: Normal Last colonoscopy: 2022, 5-year recall Last Dexa: 01/01/2020. Results were: Normal  Past medical history, past surgical history, family history and social history were all reviewed and documented in the EPIC chart. Married. 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 30 yo son, lives in [place]. Mother diagnosed with breast cancer at 38, maternal aunt at 75.   ROS:  A ROS was performed and pertinent positives and negatives are included.  Exam:  Vitals:   12/03/22 1459  BP: 108/62  Pulse: 70  Resp: 14  Weight: 138 lb (62.6 kg)  Height: 5' 3.75" (1.619 m)   Body mass index is 23.87 kg/m. Physical Exam  General appearance:  Normal Thyroid:  Symmetrical, normal in size, without palpable masses or nodularity. Respiratory  Auscultation:  Clear without wheezing or rhonchi Cardiovascular  Auscultation:  Regular rate, without rubs, murmurs or gallops  Edema/varicosities:  Not grossly evident Abdominal  Soft,nontender, without masses, guarding or rebound.  Liver/spleen:  No organomegaly noted  Hernia:  None appreciated  Skin  Inspection:  Grossly normal Breasts: Examined lying and sitting.   Right: Without masses, retractions, nipple discharge or axillary adenopathy.   Left: Without masses, retractions, nipple discharge or axillary adenopathy. Pelvic: External genitalia:  no lesions              Urethra:  normal appearing urethra with no masses, tenderness or lesions               Bartholins and Skenes: normal                 Vagina: normal appearing vagina with normal color and discharge, no lesions              Cervix: absent Bimanual Exam:  Uterus:  absent              Adnexa: no mass, fullness, tenderness              Rectovaginal: Deferred              Anus:  normal, no lesions  Patient informed chaperone available to be present for breast and pelvic exam. Patient has requested no chaperone to be present. Patient has been advised what will be completed during breast and pelvic exam.   Assessment/Plan:  62 y.o. G2P1011 for annual exam.   Well female exam with routine gynecological exam - Education provided on SBEs, importance of preventative screenings, current guidelines, high calcium diet, regular exercise, and multivitamin daily.  Labs with PCP.   Postmenopausal - No HRT. S/P TAH for endometriosis.   Screening for cervical cancer - Normal Pap history.  No longer screening per guidelines.   Screening for breast cancer - Normal mammogram history.  Continue annual screenings.  Normal breast exam today.  Screening for colon cancer - 2022 colonoscopy. Will repeat at 5-year interval per GI's recommendation.   Screening for osteoporosis - Normal bone density in 2021. Will repeat at 5-year interval per recommendation.   Return in about 1 year (around 12/03/2023) for Annual.   Olivia Mackie DNP, 3:58 PM 12/03/2022

## 2022-12-29 ENCOUNTER — Encounter (HOSPITAL_BASED_OUTPATIENT_CLINIC_OR_DEPARTMENT_OTHER): Payer: Self-pay | Admitting: Orthopaedic Surgery

## 2022-12-31 ENCOUNTER — Encounter (HOSPITAL_BASED_OUTPATIENT_CLINIC_OR_DEPARTMENT_OTHER): Payer: Self-pay | Admitting: Orthopaedic Surgery

## 2022-12-31 ENCOUNTER — Other Ambulatory Visit: Payer: Self-pay

## 2023-01-07 ENCOUNTER — Ambulatory Visit (HOSPITAL_BASED_OUTPATIENT_CLINIC_OR_DEPARTMENT_OTHER): Payer: BC Managed Care – PPO | Admitting: Certified Registered Nurse Anesthetist

## 2023-01-07 ENCOUNTER — Ambulatory Visit (HOSPITAL_BASED_OUTPATIENT_CLINIC_OR_DEPARTMENT_OTHER)
Admission: RE | Admit: 2023-01-07 | Discharge: 2023-01-07 | Disposition: A | Discharge status: Home / Self Care | Payer: BC Managed Care – PPO | Attending: Orthopaedic Surgery | Admitting: Orthopaedic Surgery

## 2023-01-07 ENCOUNTER — Other Ambulatory Visit: Payer: Self-pay

## 2023-01-07 ENCOUNTER — Encounter (HOSPITAL_BASED_OUTPATIENT_CLINIC_OR_DEPARTMENT_OTHER): Payer: Self-pay | Admitting: Orthopaedic Surgery

## 2023-01-07 ENCOUNTER — Encounter (HOSPITAL_BASED_OUTPATIENT_CLINIC_OR_DEPARTMENT_OTHER)
Admission: RE | Disposition: A | Discharge status: Home / Self Care | Payer: Self-pay | Source: Home / Self Care | Attending: Orthopaedic Surgery

## 2023-01-07 DIAGNOSIS — Z79899 Other long term (current) drug therapy: Secondary | ICD-10-CM | POA: Insufficient documentation

## 2023-01-07 DIAGNOSIS — X58XXXA Exposure to other specified factors, initial encounter: Secondary | ICD-10-CM | POA: Diagnosis not present

## 2023-01-07 DIAGNOSIS — K219 Gastro-esophageal reflux disease without esophagitis: Secondary | ICD-10-CM | POA: Diagnosis not present

## 2023-01-07 DIAGNOSIS — S76011A Strain of muscle, fascia and tendon of right hip, initial encounter: Secondary | ICD-10-CM

## 2023-01-07 DIAGNOSIS — Z791 Long term (current) use of non-steroidal anti-inflammatories (NSAID): Secondary | ICD-10-CM | POA: Insufficient documentation

## 2023-01-07 DIAGNOSIS — J449 Chronic obstructive pulmonary disease, unspecified: Secondary | ICD-10-CM | POA: Insufficient documentation

## 2023-01-07 HISTORY — PX: GLUTEUS MINIMUS REPAIR: SHX5843

## 2023-01-07 SURGERY — REPAIR, TENDON, GLUTEUS MINIMUS
Anesthesia: General | Site: Hip | Laterality: Right

## 2023-01-07 MED ORDER — ONDANSETRON HCL 4 MG/2ML IJ SOLN
INTRAMUSCULAR | Status: AC
  Filled 2023-01-07: qty 2

## 2023-01-07 MED ORDER — FENTANYL CITRATE (PF) 100 MCG/2ML IJ SOLN
INTRAMUSCULAR | Status: AC
  Filled 2023-01-07: qty 2

## 2023-01-07 MED ORDER — SCOPOLAMINE 1 MG/3DAYS TD PT72
MEDICATED_PATCH | TRANSDERMAL | Status: AC
  Filled 2023-01-07: qty 1

## 2023-01-07 MED ORDER — MIDAZOLAM HCL 5 MG/5ML IJ SOLN
INTRAMUSCULAR | Status: DC | PRN
  Administered 2023-01-07: 1 mg via INTRAVENOUS

## 2023-01-07 MED ORDER — ACETAMINOPHEN 500 MG PO TABS: 500.0000 mg | ORAL_TABLET | Freq: Three times a day (TID) | ORAL | 0 refills | Status: AC

## 2023-01-07 MED ORDER — OXYCODONE HCL 5 MG/5ML PO SOLN: 5.0000 mg | Freq: Once | ORAL | Status: AC | PRN

## 2023-01-07 MED ORDER — HYDROMORPHONE HCL 1 MG/ML IJ SOLN
INTRAMUSCULAR | Status: AC
  Filled 2023-01-07: qty 0.5

## 2023-01-07 MED ORDER — DEXAMETHASONE SODIUM PHOSPHATE 10 MG/ML IJ SOLN
INTRAMUSCULAR | Status: AC
  Filled 2023-01-07: qty 1

## 2023-01-07 MED ORDER — FENTANYL CITRATE (PF) 100 MCG/2ML IJ SOLN
INTRAMUSCULAR | Status: DC | PRN
  Administered 2023-01-07: 25 ug via INTRAVENOUS
  Administered 2023-01-07 (×2): 50 ug via INTRAVENOUS
  Administered 2023-01-07: 25 ug via INTRAVENOUS

## 2023-01-07 MED ORDER — OXYCODONE HCL 5 MG PO TABS
ORAL_TABLET | ORAL | Status: AC
  Filled 2023-01-07: qty 1

## 2023-01-07 MED ORDER — MIDAZOLAM HCL 2 MG/2ML IJ SOLN
INTRAMUSCULAR | Status: AC
  Filled 2023-01-07: qty 2

## 2023-01-07 MED ORDER — HYDROMORPHONE HCL 1 MG/ML IJ SOLN
0.2500 mg | INTRAMUSCULAR | Status: AC | PRN
  Administered 2023-01-07: 0.5 mg via INTRAVENOUS

## 2023-01-07 MED ORDER — MEPERIDINE HCL 25 MG/ML IJ SOLN: 6.2500 mg | INTRAMUSCULAR | Status: DC | PRN

## 2023-01-07 MED ORDER — BUPIVACAINE HCL 0.25 % IJ SOLN
INTRAMUSCULAR | Status: DC | PRN
  Administered 2023-01-07: 10 mL

## 2023-01-07 MED ORDER — ACETAMINOPHEN 500 MG PO TABS
ORAL_TABLET | ORAL | Status: AC
  Filled 2023-01-07: qty 2

## 2023-01-07 MED ORDER — EPHEDRINE 5 MG/ML INJ
INTRAVENOUS | Status: AC
  Filled 2023-01-07: qty 5

## 2023-01-07 MED ORDER — ROCURONIUM BROMIDE 10 MG/ML (PF) SYRINGE
PREFILLED_SYRINGE | INTRAVENOUS | Status: AC
  Filled 2023-01-07: qty 10

## 2023-01-07 MED ORDER — ONDANSETRON HCL 4 MG/2ML IJ SOLN
INTRAMUSCULAR | Status: DC | PRN
  Administered 2023-01-07: 4 mg via INTRAVENOUS

## 2023-01-07 MED ORDER — DEXAMETHASONE SODIUM PHOSPHATE 4 MG/ML IJ SOLN
INTRAMUSCULAR | Status: DC | PRN
  Administered 2023-01-07: 5 mg via INTRAVENOUS

## 2023-01-07 MED ORDER — HYDROMORPHONE HCL 1 MG/ML IJ SOLN
0.2500 mg | INTRAMUSCULAR | Status: DC | PRN
Start: 2023-01-07 — End: 2023-01-07
  Administered 2023-01-07: 0.5 mg via INTRAVENOUS
  Administered 2023-01-07 (×2): 0.25 mg via INTRAVENOUS
  Administered 2023-01-07 (×4): 0.5 mg via INTRAVENOUS

## 2023-01-07 MED ORDER — CEFAZOLIN SODIUM-DEXTROSE 2-3 GM-%(50ML) IV SOLR
INTRAVENOUS | Status: DC | PRN
  Administered 2023-01-07: 2 g via INTRAVENOUS

## 2023-01-07 MED ORDER — OXYCODONE HCL 5 MG PO TABS
5.0000 mg | ORAL_TABLET | Freq: Once | ORAL | Status: AC | PRN
  Administered 2023-01-07: 5 mg via ORAL

## 2023-01-07 MED ORDER — ROCURONIUM BROMIDE 100 MG/10ML IV SOLN
INTRAVENOUS | Status: DC | PRN
  Administered 2023-01-07: 60 mg via INTRAVENOUS

## 2023-01-07 MED ORDER — ACETAMINOPHEN 500 MG PO TABS
1000.0000 mg | ORAL_TABLET | Freq: Once | ORAL | Status: AC
  Administered 2023-01-07: 1000 mg via ORAL

## 2023-01-07 MED ORDER — SODIUM CHLORIDE 0.9 % IV SOLN
INTRAVENOUS | Status: DC | PRN
Start: 2023-01-07 — End: 2023-01-07

## 2023-01-07 MED ORDER — SCOPOLAMINE 1 MG/3DAYS TD PT72
1.0000 | MEDICATED_PATCH | TRANSDERMAL | Status: DC
  Administered 2023-01-07: 1.5 mg via TRANSDERMAL

## 2023-01-07 MED ORDER — OXYCODONE HCL 5 MG PO TABS: 5.0000 mg | ORAL_TABLET | ORAL | 0 refills | Status: DC | PRN

## 2023-01-07 MED ORDER — 0.9 % SODIUM CHLORIDE (POUR BTL) OPTIME
TOPICAL | Status: DC | PRN
  Administered 2023-01-07: 200 mL

## 2023-01-07 MED ORDER — LIDOCAINE 2% (20 MG/ML) 5 ML SYRINGE
INTRAMUSCULAR | Status: AC
  Filled 2023-01-07: qty 5

## 2023-01-07 MED ORDER — KETOROLAC TROMETHAMINE 30 MG/ML IJ SOLN
INTRAMUSCULAR | Status: AC
Start: 2023-01-07 — End: ?
  Filled 2023-01-07: qty 1

## 2023-01-07 MED ORDER — MIDAZOLAM HCL 2 MG/2ML IJ SOLN
0.5000 mg | Freq: Once | INTRAMUSCULAR | Status: DC | PRN
Start: 2023-01-07 — End: 2023-01-07

## 2023-01-07 MED ORDER — LIDOCAINE HCL (CARDIAC) PF 100 MG/5ML IV SOSY
PREFILLED_SYRINGE | INTRAVENOUS | Status: DC | PRN
  Administered 2023-01-07: 40 mg via INTRAVENOUS

## 2023-01-07 MED ORDER — PROPOFOL 10 MG/ML IV BOLUS
INTRAVENOUS | Status: DC | PRN
  Administered 2023-01-07: 120 mg via INTRAVENOUS

## 2023-01-07 MED ORDER — ASPIRIN 325 MG PO TBEC: 325.0000 mg | DELAYED_RELEASE_TABLET | Freq: Every day | ORAL | 0 refills | Status: DC

## 2023-01-07 MED ORDER — LACTATED RINGERS IV SOLN: INTRAVENOUS | Status: DC

## 2023-01-07 MED ORDER — PROPOFOL 10 MG/ML IV BOLUS
INTRAVENOUS | Status: AC
  Filled 2023-01-07: qty 20

## 2023-01-07 SURGICAL SUPPLY — 53 items
ANCHOR JUGGERKNOT SOFT 1.5 (Anchor) ×2 IMPLANT
ANCHOR JUGGERKNOT SOFT 2.9 (Anchor) IMPLANT
ANCHOR SUT QUATTRO KNTLS 4.5 (Anchor) IMPLANT
BLADE SURG 10 STRL SS (BLADE) ×1 IMPLANT
BLADE SURG 15 STRL LF DISP TIS (BLADE) ×1 IMPLANT
BLADE SURG 15 STRL SS (BLADE) ×1
CANISTER SUCT 1200ML W/VALVE (MISCELLANEOUS) ×1 IMPLANT
CHLORAPREP W/TINT 26 (MISCELLANEOUS) ×1 IMPLANT
CLSR STERI-STRIP ANTIMIC 1/2X4 (GAUZE/BANDAGES/DRESSINGS) IMPLANT
COOLER ICEMAN CLASSIC (MISCELLANEOUS) ×1 IMPLANT
COVER BACK TABLE 60X90IN (DRAPES) ×1 IMPLANT
COVER MAYO STAND STRL (DRAPES) ×1 IMPLANT
DERMABOND ADVANCED .7 DNX12 (GAUZE/BANDAGES/DRESSINGS) IMPLANT
DRAPE STERI IOBAN 125X83 (DRAPES) ×1 IMPLANT
DRAPE U-SHAPE 47X51 STRL (DRAPES) ×2 IMPLANT
DRSG AQUACEL AG ADV 3.5X 6 (GAUZE/BANDAGES/DRESSINGS) ×1 IMPLANT
DRSG AQUACEL AG ADV 3.5X10 (GAUZE/BANDAGES/DRESSINGS) IMPLANT
ELECT BLADE 4.0 EZ CLEAN MEGAD (MISCELLANEOUS)
ELECT REM PT RETURN 9FT ADLT (ELECTROSURGICAL) ×1
ELECTRODE BLDE 4.0 EZ CLN MEGD (MISCELLANEOUS) IMPLANT
ELECTRODE REM PT RTRN 9FT ADLT (ELECTROSURGICAL) ×1 IMPLANT
GAUZE PAD ABD 8X10 STRL (GAUZE/BANDAGES/DRESSINGS) IMPLANT
GAUZE XEROFORM 1X8 LF (GAUZE/BANDAGES/DRESSINGS) IMPLANT
GLOVE BIO SURGEON STRL SZ 6 (GLOVE) ×2 IMPLANT
GLOVE BIO SURGEON STRL SZ7.5 (GLOVE) ×2 IMPLANT
GLOVE BIOGEL PI IND STRL 6.5 (GLOVE) ×1 IMPLANT
GLOVE BIOGEL PI IND STRL 8 (GLOVE) ×1 IMPLANT
GOWN STRL REUS W/ TWL LRG LVL3 (GOWN DISPOSABLE) ×2 IMPLANT
GOWN STRL REUS W/TWL LRG LVL3 (GOWN DISPOSABLE) ×2
GOWN STRL REUS W/TWL XL LVL3 (GOWN DISPOSABLE) ×1 IMPLANT
IMPL TAPESTRY BIOINTEGR 40X30 (Mesh General) IMPLANT
MANIFOLD NEPTUNE II (INSTRUMENTS) ×1 IMPLANT
NDL HYPO 22X1.5 SAFETY MO (MISCELLANEOUS) ×1 IMPLANT
NEEDLE HYPO 22X1.5 SAFETY MO (MISCELLANEOUS) ×1 IMPLANT
NS IRRIG 1000ML POUR BTL (IV SOLUTION) ×1 IMPLANT
PACK BASIN DAY SURGERY FS (CUSTOM PROCEDURE TRAY) ×1 IMPLANT
PAD COLD SHLDR WRAP-ON (PAD) ×1 IMPLANT
PENCIL SMOKE EVACUATOR (MISCELLANEOUS) ×1 IMPLANT
SLEEVE SCD COMPRESS KNEE MED (STOCKING) ×1 IMPLANT
SPIKE FLUID TRANSFER (MISCELLANEOUS) IMPLANT
SPONGE T-LAP 18X18 ~~LOC~~+RFID (SPONGE) ×1 IMPLANT
SUCTION TUBE FRAZIER 10FR DISP (SUCTIONS) ×1 IMPLANT
SUT ETHILON 3 0 PS 1 (SUTURE) IMPLANT
SUT MNCRL AB 3-0 PS2 27 (SUTURE) IMPLANT
SUT VIC AB 0 CT1 27XBRD ANBCTR (SUTURE) ×1 IMPLANT
SUT VIC AB 2-0 CT1 TAPERPNT 27 (SUTURE) ×1 IMPLANT
SUT VIC AB CT1 27XBRD ANBCTRL (SUTURE) ×2
SYR 20ML LL LF (SYRINGE) ×1 IMPLANT
SYR BULB EAR ULCER 3OZ GRN STR (SYRINGE) ×1 IMPLANT
TOWEL GREEN STERILE FF (TOWEL DISPOSABLE) ×2 IMPLANT
TUBE CONNECTING 20X1/4 (TUBING) ×1 IMPLANT
UNDERPAD 30X36 HEAVY ABSORB (UNDERPADS AND DIAPERS) IMPLANT
YANKAUER SUCT BULB TIP NO VENT (SUCTIONS) ×1 IMPLANT

## 2023-01-07 NOTE — Anesthesia Procedure Notes (Signed)
Procedure Name: Intubation Date/Time: 01/07/2023 10:32 AM  Performed by: Cleda Clarks, CRNAPre-anesthesia Checklist: Patient identified, Emergency Drugs available, Suction available and Patient being monitored Patient Re-evaluated:Patient Re-evaluated prior to induction Oxygen Delivery Method: Circle system utilized Preoxygenation: Pre-oxygenation with 100% oxygen Induction Type: IV induction Ventilation: Mask ventilation without difficulty Laryngoscope Size: Miller and 2 Grade View: Grade II Tube type: Oral Tube size: 7.0 mm Number of attempts: 1 Airway Equipment and Method: Stylet and Oral airway Placement Confirmation: ETT inserted through vocal cords under direct vision, positive ETCO2 and breath sounds checked- equal and bilateral Secured at: 20 cm Tube secured with: Tape Dental Injury: Teeth and Oropharynx as per pre-operative assessment

## 2023-01-07 NOTE — Discharge Instructions (Addendum)
Discharge Instructions    Attending Surgeon: [name], MD Office Phone Number: [phone]   Diagnosis and Procedures:    Surgeries Performed: Right hip gluteus maximus tendon transfer with gluteus medius repair  Discharge Plan:    Diet: Resume usual diet. Begin with light or bland foods.  Drink plenty of fluids.  Activity:  50% weight bearing right leg. You are advised to go home directly from the hospital or surgical center. Restrict your activities.  GENERAL INSTRUCTIONS: 1.  Please apply ice to your wound to help with swelling and inflammation. This will improve your comfort and your overall recovery following surgery.     2. Please call Dr. Serena Croissant office at [phone] with questions Monday-Friday during business hours. If no one answers, please leave a message and someone should get back to the patient within 24 hours. For emergencies please call 911 or proceed to the emergency room.   3. Patient to notify surgical team if experiences any of the following: Bowel/Bladder dysfunction, uncontrolled pain, nerve/muscle weakness, incision with increased drainage or redness, nausea/vomiting and Fever greater than 101.0 F.  Be alert for signs of infection including redness, streaking, odor, fever or chills. Be alert for excessive pain or bleeding and notify your surgeon immediately.  WOUND INSTRUCTIONS:   Leave your dressing, cast, or splint in place until your post operative visit.  Keep it clean and dry.  Always keep the incision clean and dry until the staples/sutures are removed. If there is no drainage from the incision you should keep it open to air. If there is drainage from the incision you must keep it covered at all times until the drainage stops  Do not soak in a bath tub, hot tub, pool, lake or other body of water until 21 days after your surgery and your incision is completely dry and healed.  If you have removable sutures (or staples) they must be  removed 10-14 days (unless otherwise instructed) from the day of your surgery.     1)  Elevate the extremity as much as possible.  2)  Keep the dressing clean and dry.  3)  Please call us if the dressing becomes wet or dirty.  4)  If you are experiencing worsening pain or worsening swelling, please call.     MEDICATIONS: Resume all previous home medications at the previous prescribed dose and frequency unless otherwise noted Start taking the  pain medications on an as-needed basis as prescribed  Please taper down pain medication over the next week following surgery.  Ideally you should not require a refill of any narcotic pain medication.  Take pain medication with food to minimize nausea. In addition to the prescribed pain medication, you may take over-the-counter pain relievers such as Tylenol.  Do NOT take additional tylenol if your pain medication already has tylenol in it. No Tylenol until after 3:00pm today, if needed.  Aspirin 325mg  daily per instructions on bottle. Narcotic policy: Per [facility] clinic policy, our goal is ensure optimal postoperative pain control with a multimodal pain management strategy. For all [facility] patients, our goal is to wean post-operative narcotic medications by 6 weeks post-operatively, and many times sooner. If this is not possible due to utilization of pain medication prior to surgery, your [facility] doctor will support your acute post-operative pain control for the first 6 weeks postoperatively, with a plan to transition you back to your primary pain team following that. Cyndia Skeeters will work to ensure a Therapist, occupational.  FOLLOWUP INSTRUCTIONS: 1. Follow up at the Physical Therapy Clinic 3-4 days following surgery. This appointment should be scheduled unless other arrangements have been made.The Physical Therapy scheduling number is [phone] if an appointment has not already been arranged.  2. Contact Dr. Serena Croissant office during office hours at  [phone] or the practice after hours line at [phone] for non-emergencies. For medical emergencies call 911.   Discharge Location: Home   May take Tylenol after 3pm, if needed.  May take Oxycodone after 4:15 pm, if needed.    Post Anesthesia Home Care Instructions  Activity: Get plenty of rest for the remainder of the day. A responsible individual must stay with you for 24 hours following the procedure.  For the next 24 hours, DO NOT: -Drive a car -Advertising copywriter -Drink alcoholic beverages -Take any medication unless instructed by your physician -Make any legal decisions or sign important papers.  Meals: Start with liquid foods such as gelatin or soup. Progress to regular foods as tolerated. Avoid greasy, spicy, heavy foods. If nausea and/or vomiting occur, drink only clear liquids until the nausea and/or vomiting subsides. Call your physician if vomiting continues.  Special Instructions/Symptoms: Your throat may feel dry or sore from the anesthesia or the breathing tube placed in your throat during surgery. If this causes discomfort, gargle with warm salt water. The discomfort should disappear within 24 hours.  If you had a scopolamine patch placed behind your ear for the management of post- operative nausea and/or vomiting:  1. The medication in the patch is effective for 72 hours, after which it should be removed.  Wrap patch in a tissue and discard in the trash. Wash hands thoroughly with soap and water. 2. You may remove the patch earlier than 72 hours if you experience unpleasant side effects which may include dry mouth, dizziness or visual disturbances. 3. Avoid touching the patch. Wash your hands with soap and water after contact with the patch.

## 2023-01-07 NOTE — H&P (Addendum)
Chief Complaint: Right hip pain        History of Present Illness:      Hailey [name] is a 62 y.o. female presents today with ongoing lateral based hip pain which has been over the last several years.  She has been experiencing pain over the greater trochanter.  She has not had multiple injections in this area including the hip joint.  She is having a very difficult time laying directly on the side.  She does have a history of a left leg IT band release in 2015.  She is having a very hard time staying active and has done physical therapy as well as dry needling and multiple injections with both Dr. Shon Baton and Dr. Magnus Ivan.  She is here today for further discussion.       Surgical History:   None   PMH/PSH/Family History/Social History/Meds/Allergies:         Past Medical History:  Diagnosis Date   COVID 03/2020   Endometriosis     GERD (gastroesophageal reflux disease)     Hyperprolactinemia (HCC)      MRI 1992, 2001 AND 2003. LAST MRI NORMAL.   Psoriasis               Past Surgical History:  Procedure Laterality Date   BUNIONECTOMY       DILATION AND CURETTAGE OF UTERUS   2002    AT TIME OF HYSTEROSCOPY   FOOT SURGERY        spurs   HYSTEROSCOPY   2002    AT TIME OF DIAG LAP A HYSTEROSCOPY, D&C WAS DONE   IT band surg       MOUTH SURGERY       PELVIC LAPAROSCOPY   2002    DIAG LAP W LASER DONE WITH HYSTEROSCOPY,D&C   TOTAL ABDOMINAL HYSTERECTOMY   01/11/02        Social History         Socioeconomic History   Marital status: Married      Spouse name: Not on file   Number of children: Not on file   Years of education: Not on file   Highest education level: Not on file  Occupational History   Not on file  Tobacco Use   Smoking status: Never   Smokeless tobacco: Never  Vaping Use   Vaping status: Never Used  Substance and Sexual Activity   Alcohol use: Yes      Comment: 0-2 month   Drug use: No   Sexual activity: Yes      Partners: Male       Birth control/protection: Surgical      Comment: HYST-1st intercourse 37 yo-5 partners  Other Topics Concern   Not on file  Social History Narrative   Not on file    Social Determinants of Health        Financial Resource Strain: Low Risk  (09/04/2022)    Received from [facility]    Overall Financial Resource Strain (CARDIA)     Difficulty of Paying Living Expenses: Not hard at all  Food Insecurity: No Food Insecurity (09/04/2022)    Received from [facility]    Hunger Vital Sign     Worried About Running Out of Food in the Last Year: Never true     Ran Out of Food in the Last Year: Never true  Transportation Needs: No Transportation Needs (09/04/2022)    Received from [facility]  PRAPARE - Therapist, art (Medical): No     Lack of Transportation (Non-Medical): No  Physical Activity: Insufficiently Active (09/04/2022)    Received from [facility]    Exercise Vital Sign     Days of Exercise per Week: 3 days     Minutes of Exercise per Session: 30 min  Stress: No Stress Concern Present (09/04/2022)    Received from [facility] - Occupational Stress Questionnaire     Feeling of Stress : Not at all  Social Connections: Socially Integrated (09/04/2022)    Received from [facility]    Social Network     How would you rate your social network (family, work, friends)?: Good participation with social networks         Family History  Problem Relation Age of Onset   Breast cancer Mother 47   ALS Mother     Cancer Father          PAROTID GLAND   Hypertension Father     Breast cancer Maternal Aunt 71   Heart failure Maternal Aunt     Cancer Maternal Aunt          Lymphoma   Heart disease Maternal Grandmother     Heart disease Paternal Grandmother     Heart failure Maternal Uncle          Allergies       Allergies  Allergen Reactions   Codeine        REACTION: Itching             Current Outpatient Medications  Medication Sig Dispense Refill   albuterol (VENTOLIN HFA) 108 (90 Base) MCG/ACT inhaler         ALPRAZolam (XANAX) 0.25 MG tablet TAKE 1 TABLET BY MOUTH EVERY 8 HOURS AS NEEDED FOR ANXIETY 30 tablet 0   APPLE CIDER VINEGAR PO Take by mouth.       Apremilast (OTEZLA) 30 MG TABS 1 tablet       Ascorbic Acid (VITAMIN C PO) Take by mouth.         Azelastine HCl 137 MCG/SPRAY SOLN         B Complex Vitamins (VITAMIN-B COMPLEX PO) Take by mouth.         Calcium Carbonate-Vitamin D (CALCIUM + D PO) Take by mouth.         cetirizine (ZYRTEC) 10 MG tablet Take 10 mg by mouth daily.       Cholecalciferol (VITAMIN D3 PO) Take by mouth.       CINNAMON PO Take by mouth.       clobetasol cream (TEMOVATE) 0.05 % APP ON THE SKIN BID PRN   0   Cranberry 500 MG TABS Take by mouth.       cycloSPORINE (RESTASIS) 0.05 % ophthalmic emulsion 1 drop 2 (two) times daily.       diclofenac Sodium (VOLTAREN) 1 % GEL Apply 4 g topically 4 (four) times daily. 350 g 1   dicyclomine (BENTYL) 10 MG capsule Take by mouth. 60mg  daily       fluticasone (FLONASE) 50 MCG/ACT nasal spray         ipratropium (ATROVENT) 0.06 % nasal spray         MAGNESIUM PO Take by mouth.       Multiple Vitamins-Iron (MULTIVITAMIN/IRON PO) Take by mouth.         Multiple Vitamins-Minerals (ZINC PO)  Take by mouth.       nystatin-triamcinolone ointment (MYCOLOG) Apply 1 application topically 2 (two) times daily. 30 g 0   Omega-3 Fatty Acids (FISH OIL PO) Take by mouth.         POTASSIUM PO Take by mouth.         Probiotic Product (PROBIOTIC-10 PO) Take by mouth.       Red Yeast Rice Extract (RED YEAST RICE PO) Take by mouth.       Turmeric 400 MG CAPS Take by mouth.       UNABLE TO FIND Liver care capsules Stress care caps [facility]ocare - hot flushes shatavari - supplement       vitamin B-12 (CYANOCOBALAMIN) 100 MCG tablet         VITAMIN E PO Take by mouth.            No current facility-administered  medications for this visit.      Imaging Results (Last 48 hours)  No results found.     Review of Systems:   A ROS was performed including pertinent positives and negatives as documented in the HPI.   Physical Exam :   Constitutional: NAD and appears stated age Neurological: Alert and oriented Psych: Appropriate affect and cooperative Last menstrual period 01/11/2002.    Comprehensive Musculoskeletal Exam:     Inspection Right Left  Skin No atrophy or gross abnormalities appreciated No atrophy or gross abnormalities appreciated  Palpation      Tenderness None None  Crepitus None None  Range of Motion      Flexion (passive) 120 120  Extension 30 30  IR 30 30  ER 45 45  Strength      Flexion  5/5 5/5  Extension 5/5 5/5  Special Tests      FABER Negative Negative  FADIR Negative Negative  ER Lag/Capsular Insufficiency Negative Negative  Instability Negative Negative  Sacroiliac pain Negative  Negative   Instability      Generalized Laxity No No  Neurologic      sciatic, femoral, obturator nerves intact to light sensation  Vascular/Lymphatic      DP pulse 2+ 2+  Lumbar Exam      Patient has symmetric lumbar range of motion with negative pain referral to hip    Positive Trendelenburg on the right with tenderness over the greater trochanter and weakness with resisted abduction Imaging:   Xray (4 views right hip): Normal   MRI (right hip): Full-thickness gluteus medius tearing with some atrophy including the muscle belly although predominantly good Tellier grade 1 changes   I personally reviewed and interpreted the radiographs.     Assessment:   62 y.o. female with right hip pain in the setting of a full-thickness gluteus medius tear which is now failed multiple forms of therapy including physical therapy, injections, dry needling.  I did discuss that given the fact that she does have some fatty atrophy on the T1-weighted image that ultimately she would be a  candidate for repair although we would recommend patch augmentation.  I discussed that I would consider surgery sooner rather than later as there is a risk of progression of the fatty atrophy particularly given the full-thickness tearing.  I did discuss possible gluteus maximus tendon transfer shoulder the tissue not be reconstruct able. After discussion of the risks and benefits she has ultimately elected for a right hip gluteus medius repair with collagen patch augmentation   Plan :     -Plan  for right hip gluteus medius repair with collagen patch augmentation versus gluteus maximus tendon transfer     After a lengthy discussion of treatment options, including risks, benefits, alternatives, complications of surgical and nonsurgical conservative options, the patient elected surgical repair.    The patient  is aware of the material risks  and complications including, but not limited to injury to adjacent structures, neurovascular injury, infection, numbness, bleeding, implant failure, thermal burns, stiffness, persistent pain, failure to heal, disease transmission from allograft, need for further surgery, dislocation, anesthetic risks, blood clots, risks of death,and others. The probabilities of surgical success and failure discussed with patient given their particular co-morbidities.The time and nature of expected rehabilitation and recovery was discussed.The patient's questions were all answered preoperatively.  No barriers to understanding were noted. I explained the natural history of the disease process and Rx rationale.  I explained to the patient what I considered to be reasonable expectations given their personal situation.  The final treatment plan was arrived at through a shared patient decision making process model.           I personally saw and evaluated the patient, and participated in the management and treatment plan.   [name], MD Attending Physician, Orthopedic Surgery    This document was dictated using Dragon voice recognition software. A reasonable attempt at proof reading has been made to minimize errors.

## 2023-01-07 NOTE — Op Note (Addendum)
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  The risk and benefits of the procedure were discussed in detail and documented in the pre-operative evaluation.   PREOPERATIVE DIAGNOSIS: 1. Right hip full thickness gluteus medius tear  POSTOPERATIVE DIAGNOSIS: Same.  PROCEDURE: 1. Right hip gluteus maximus tendon transfer 2. Right hip trochanteric bursectomy  SURGEON: Benancio Deeds MD  ASSISTANT: Kerby Less, ATC  ANESTHESIA:  general  IV FLUIDS AND URINE: See anesthesia record.  ANTIBIOTICS: Ancef  ESTIMATED BLOOD LOSS: 20 mL.  IMPLANTS:  Implant Name Type Inv. Item Serial No. Manufacturer Lot No. LRB No. Used Action  ANCHOR JUGGERKNOT SOFT 1.5 - [id number] Anchor ANCHOR JUGGERKNOT SOFT 1.5  [employer](ORTH,TRAU,BIO,SG) [id number] Right 2 Implanted  ANCHOR SUT QUATTRO KNTLS 4.5 - [id number] Anchor ANCHOR SUT QUATTRO KNTLS 4.5  [employer]ON(ORTH,TRAU,BIO,SG) [id number] Right 2 Implanted  IMPL TAPESTRY BIOINTEGR 40X30 - [id number] Mesh General IMPL TAPESTRY BIOINTEGR 40X30  [employer]ON(ORTH,TRAU,BIO,SG) [id number] Right 1 Implanted    DRAINS: None  CULTURES: None  COMPLICATIONS: none  DESCRIPTION OF PROCEDURE:   The patient was identified in the preoperative holding area.  Antibiotics were given 1 hour prior to skin incision.  Timeout was performed and surgical site was marked with nursing.  The patient was subsequently taken back to the operating room.  Anesthesia was induced. The patient was placed in the lateral decubitus position.  Axillary roll and down peroneal nerve was padded well   At this time I began with an approach to the lateral trochanter centered at the vastus ridge.  15 blade was used to incise skin.  This was done down to the level of the IT band.  Electrocautery was utilized to achieve hemostasis.  At this time the IT band was marked in such a way as to preserve an  anterior flap.  This was incised completely with a of an abduction.  Gluteus medius was completely torn and found to be nonviable with significant fatty infiltration.  At this time a flap was mobilized anteriorly from the gluteus maximus in a triangular fashion.  This mobilized well over to the trochanter. The trochanteric bursectomy was seen and excises with Metzenbaums.   A double row construct was utilized with 2 medial row all suture anchors.  These were brought up through the remaining gluteus medius tendon and into the flap.  2 of these were tied to provisionally stabilize the flap.  These were then brought up to the distal most aspect of the flap and brought into 2 lateral row anchors over the native footprint of the gluteus medius.  Given the quality of the tendon the decision was made to supplement with a collagen patch.  This was sutured in the corners into the native insertion of the gluteus medius.   This time the wound was thoroughly irrigated.  The posterior aspect of the IT band was sutured to the posterior limb of the flap.  This was done with 0 Vicryl.  This was done in an interrupted fashion.  The wound was again irrigated and closed in layers of 0 Vicryl, 2-0 Vicryl and 3-0 nylon.  Xeroform, gauze, Aquacel dressing were applied.   Instrument, sponge, and needle counts were correct prior to wound closure and at the conclusion of the case.  The patient was taken to the PACU without complication         POSTOPERATIVE PLAN: The patient will be 50% weightbearing  for total of 2 weeks.  She will be placed on Aspirin for blood clot prevention.  I will see the patient back in 2 weeks.    Benancio Deeds, MD 11:23 AM

## 2023-01-07 NOTE — Brief Op Note (Signed)
   Brief Op Note  Date of Surgery: 01/07/2023  Preoperative Diagnosis: RIGHT GLUTEUS MEDIUS TEAR  Postoperative Diagnosis: same  Procedure: Procedure(s): RIGHT HIP GLUTEUS MEDIUS REPAIR,POSSIBLE COLLAGEN PATCH AUGUMENTATION  Implants: Implant Name Type Inv. Item Serial No. Manufacturer Lot No. LRB No. Used Action  ANCHOR JUGGERKNOT SOFT 1.5 - [id number] Anchor ANCHOR JUGGERKNOT SOFT 1.5  [employer](ORTH,TRAU,BIO,SG) [id number] Right 2 Implanted  ANCHOR SUT QUATTRO KNTLS 4.5 - [id number] Anchor ANCHOR SUT QUATTRO KNTLS 4.5  [employer]ON(ORTH,TRAU,BIO,SG) [id number] Right 2 Implanted  IMPL TAPESTRY BIOINTEGR 40X30 - [id number] Mesh General IMPL TAPESTRY Venida Jarvis  [employer]ON(ORTH,TRAU,BIO,SG) [id number] Right 1 Implanted    Surgeons: Surgeon(s): [name], MD  Anesthesia: General    Estimated Blood Loss: See anesthesia record  Complications: None  Condition to PACU: Stable  Benancio Deeds, MD 01/07/2023 11:23 AM

## 2023-01-07 NOTE — Transfer of Care (Signed)
Immediate Anesthesia Transfer of Care Note  Patient: Hailey [name]  Procedure(s) Performed: RIGHT HIP GLUTEUS MEDIUS REPAIR,POSSIBLE COLLAGEN PATCH AUGUMENTATION (Right: Hip)  Patient Location: PACU  Anesthesia Type:General  Level of Consciousness: awake, alert , and patient cooperative  Airway & Oxygen Therapy: Patient Spontanous Breathing and Patient connected to face mask oxygen  Post-op Assessment: Report given to RN and Post -op Vital signs reviewed and stable  Post vital signs: Reviewed and stable  Last Vitals:  Vitals Value Taken Time  BP 111/50 01/07/23 1131  Temp 36.5 C 01/07/23 1131  Pulse 68 01/07/23 1138  Resp 13 01/07/23 1138  SpO2 94 % 01/07/23 1138  Vitals shown include unfiled device data.  Last Pain:  Vitals:   01/07/23 0804  TempSrc: Oral  PainSc: 2          Complications: No notable events documented.

## 2023-01-07 NOTE — Anesthesia Preprocedure Evaluation (Addendum)
Anesthesia Evaluation  Patient identified by MRN, date of birth, ID band Patient awake    Reviewed: Allergy & Precautions, NPO status , Patient's Chart, lab work & pertinent test results  History of Anesthesia Complications Negative for: history of anesthetic complications  Airway Mallampati: I  TM Distance: >3 FB Neck ROM: Full    Dental  (+) Teeth Intact, Dental Advisory Given   Pulmonary asthma , COPD (last inhaler needed 2021),  COPD inhaler   breath sounds clear to auscultation       Cardiovascular negative cardio ROS  Rhythm:Regular Rate:Normal     Neuro/Psych negative neurological ROS     GI/Hepatic Neg liver ROS,GERD  Controlled,,  Endo/Other  negative endocrine ROS    Renal/GU negative Renal ROS     Musculoskeletal   Abdominal   Peds  Hematology negative hematology ROS (+)   Anesthesia Other Findings   Reproductive/Obstetrics                             Anesthesia Physical Anesthesia Plan  ASA: 2  Anesthesia Plan: General   Post-op Pain Management: Tylenol PO (pre-op)*   Induction: Intravenous  PONV Risk Score and Plan: 3 and Ondansetron, Dexamethasone and Scopolamine patch - Pre-op  Airway Management Planned: Oral ETT  Additional Equipment: None  Intra-op Plan:   Post-operative Plan: Extubation in OR  Informed Consent: I have reviewed the patients History and Physical, chart, labs and discussed the procedure including the risks, benefits and alternatives for the proposed anesthesia with the patient or authorized representative who has indicated his/her understanding and acceptance.     Dental advisory given  Plan Discussed with: CRNA and Surgeon  Anesthesia Plan Comments:         Anesthesia Quick Evaluation

## 2023-01-07 NOTE — Interval H&P Note (Signed)
History and Physical Interval Note:  01/07/2023 9:55 AM  Hailey [name]  has presented today for surgery, with the diagnosis of RIGHT GLUTEUS MEDIUS TEAR.  The various methods of treatment have been discussed with the patient and family. After consideration of risks, benefits and other options for treatment, the patient has consented to  Procedure(s): RIGHT HIP GLUTEUS MEDIUS REPAIR,POSSIBLE COLLAGEN PATCH AUGUMENTATION (Right) as a surgical intervention.  The patient's history has been reviewed, patient examined, no change in status, stable for surgery.  I have reviewed the patient's chart and labs.  Questions were answered to the patient's satisfaction.     [name]

## 2023-01-07 NOTE — Anesthesia Postprocedure Evaluation (Signed)
Anesthesia Post Note  Patient: SADAKO LEGAULT  Procedure(s) Performed: RIGHT HIP GLUTEUS MEDIUS REPAIR,POSSIBLE COLLAGEN PATCH AUGUMENTATION (Right: Hip)     Patient location during evaluation: PACU Anesthesia Type: General Level of consciousness: awake and alert, oriented and patient cooperative Pain management: pain level controlled Vital Signs Assessment: post-procedure vital signs reviewed and stable Respiratory status: spontaneous breathing, nonlabored ventilation and respiratory function stable Cardiovascular status: blood pressure returned to baseline and stable Postop Assessment: no apparent nausea or vomiting Anesthetic complications: no   No notable events documented.  Last Vitals:  Vitals:   01/07/23 1301 01/07/23 1315  BP:  (!) 103/53  Pulse:  67  Resp:  15  Temp:    SpO2: 100% 100%    Last Pain:  Vitals:   01/07/23 1300  TempSrc:   PainSc: Asleep                 Selda Jalbert,E. Avo Schlachter

## 2023-01-08 ENCOUNTER — Encounter (HOSPITAL_BASED_OUTPATIENT_CLINIC_OR_DEPARTMENT_OTHER): Payer: Self-pay | Admitting: Orthopaedic Surgery

## 2023-01-10 ENCOUNTER — Ambulatory Visit: Payer: BC Managed Care – PPO | Attending: Family Medicine | Admitting: Rehabilitative and Restorative Service Providers"

## 2023-01-10 ENCOUNTER — Other Ambulatory Visit: Payer: Self-pay

## 2023-01-10 ENCOUNTER — Encounter: Payer: Self-pay | Admitting: Rehabilitative and Restorative Service Providers"

## 2023-01-10 DIAGNOSIS — S76011A Strain of muscle, fascia and tendon of right hip, initial encounter: Secondary | ICD-10-CM | POA: Diagnosis not present

## 2023-01-10 DIAGNOSIS — R2689 Other abnormalities of gait and mobility: Secondary | ICD-10-CM | POA: Insufficient documentation

## 2023-01-10 DIAGNOSIS — M25551 Pain in right hip: Secondary | ICD-10-CM | POA: Diagnosis not present

## 2023-01-10 DIAGNOSIS — X58XXXA Exposure to other specified factors, initial encounter: Secondary | ICD-10-CM | POA: Diagnosis not present

## 2023-01-10 DIAGNOSIS — M6281 Muscle weakness (generalized): Secondary | ICD-10-CM | POA: Insufficient documentation

## 2023-01-10 NOTE — Therapy (Signed)
OUTPATIENT PHYSICAL THERAPY LOWER EXTREMITY EVALUATION   Patient Name: Hailey [name] MRN: [id number] DOB:01-08-1961, 62 y.o., female Today's Date: 01/10/2023  END OF SESSION:  PT End of Session - 01/10/23 1058     Visit Number 1    Number of Visits 16    Date for PT Re-Evaluation 03/11/23    Authorization Type BCBS    PT Start Time 1102    PT Stop Time 1145    PT Time Calculation (min) 43 min    Activity Tolerance Patient tolerated treatment well    Behavior During Therapy [facility] for tasks assessed/performed             Past Medical History:  Diagnosis Date   COVID 03/2020   Endometriosis    GERD (gastroesophageal reflux disease)    Hyperprolactinemia (HCC)    MRI 1992, 2001 AND 2003. LAST MRI NORMAL.   Psoriasis    Past Surgical History:  Procedure Laterality Date   BUNIONECTOMY     DILATION AND CURETTAGE OF UTERUS  2002   AT TIME OF HYSTEROSCOPY   FOOT SURGERY     spurs   GLUTEUS MINIMUS REPAIR Right 01/07/2023   Procedure: RIGHT HIP GLUTEUS MEDIUS REPAIR,POSSIBLE COLLAGEN PATCH AUGUMENTATION;  Surgeon: [name], MD;  Location: [facility];  Service: Orthopedics;  Laterality: Right;   HYSTEROSCOPY  2002   AT TIME OF DIAG LAP A HYSTEROSCOPY, D&C WAS DONE   IT band surg     MOUTH SURGERY     PELVIC LAPAROSCOPY  2002   DIAG LAP W LASER DONE WITH HYSTEROSCOPY,D&C   TOTAL ABDOMINAL HYSTERECTOMY  01/11/02   Patient Active Problem List   Diagnosis Date Noted   Tear of right gluteus medius tendon 01/07/2023   Unilateral primary osteoarthritis, left knee 08/04/2017   Unilateral primary osteoarthritis, right knee 08/04/2017   Chronic pain of left knee 11/04/2016   Chronic pain of right knee 11/04/2016   Trochanteric bursitis, right hip 07/17/2016   Right hand pain 04/14/2016   ACUTE PHARYNGITIS 12/23/2008   ALLERGIC RHINITIS 12/23/2008    PCP: Jarrett Soho, PA-C  REFERRING PROVIDER: [name]  REFERRING DIAG: R hip glut  repair  THERAPY DIAG:  Muscle weakness (generalized)  Other abnormalities of gait and mobility  Pain in right hip  Rationale for Evaluation and Treatment: Rehabilitation  ONSET DATE: Date of surgery 01/07/23  (2 WEEKS 12/3 AND 6 WEEKS IS 02/18/23)  SUBJECTIVE:   SUBJECTIVE STATEMENT: The patient is s/p R gluteus medius repair on 01/07/23. She is doing ice machine 2x/day She was very sore on Wednesday and reports feeling hot around the area of incision. She has had hip pain x 2 years noting pain when rolling onto the right side at night. She tried PT and nothing helped.   PERTINENT HISTORY: GI issues, h/o kidney stones PAIN:  Are you having pain? Yes: NPRS scale: 6/10 Pain location: hip Pain description: twinges during movement like sit<>stand Aggravating factors: sit<>stand or after sitting for longer periods Relieving factors: gentle movement  PRECAUTIONS: Other: 50% WB through 01/21/23 (2 weeks), and ROM restrictions until 02/18/23 per protocol   WEIGHT BEARING RESTRICTIONS: Yes 50% WB through 01/21/23  FALLS:  Has patient fallen in last 6 months? 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  PATIENT SURVEYS:  FOTO 29%, goal to 58%   SENSATION: WFL  -- notes mild numbness lateral hip on back side of bandage  EDEMA:  Erythema and bruising noted around surgical site  POSTURE:  WNLs  LOWER EXTREMITY ZOX:WRUE hip flexion t 90 per protocolPROM abduction to 15 degrees avoided IR, ER and adduction at this time.   SAQ and pelvic tilt  Passive ROM Right eval  Left eval  Hip flexion To 90 degrees without pain   Hip extension    Hip abduction 15 degrees PROM   Hip adduction    Hip internal rotation    Hip external rotation    Knee flexion    Knee extension    Ankle dorsiflexion    Ankle plantarflexion    Ankle inversion    Ankle eversion     (Blank rows = not tested)  LOWER EXTREMITY MMT: not tested-- patient demonstrates good overall strength against gravity with L LE per functional activities  MMT Right eval Left eval  Hip flexion    Hip extension    Hip abduction    Hip adduction    Hip internal rotation    Hip external rotation    Knee flexion    Knee extension    Ankle dorsiflexion    Ankle plantarflexion    Ankle inversion    Ankle eversion     (Blank rows = not tested)   FUNCTIONAL TESTS:  1.33 ft/sec gait speed   GAIT: Distance walked: with rollator RW ---- PT adjusted height so patient could use for WB through [employer] to offload R LE to match 50% WB status. Assistive device utilized: Environmental consultant - 4 wheeled Level of assistance: Modified independence Comments: Stair negotiation reviewed and loaner RW from out clinic provided-- patient was going up/down her 2 steps from garage into home without rails using husband to lean on--- not maintaining WB precautions.   [facility] Adult PT Treatment:                                                DATE: 01/10/23 Therapeutic Exercise: See HEP below Gait: Provided loaner RW b/c patient using walls in the home and not walker (rollator too large in home) Discussed using in middle of the night when rising to avoid injury Self Care: Discussed WB status Husband notes they were told she could do anything she wants after she is off pain meds (reviwed ROM limitations at this time-- from protocol)    PATIENT EDUCATION:  Education details: HEP Person educated: Patient Education method: Explanation, Demonstration, and Handouts Education comprehension: verbalized understanding, returned  demonstration, and needs further education  HOME EXERCISE PROGRAM: Access Code: [email or web address] URL: [email or web address] Date: 01/10/2023 Prepared by: Margretta Ditty  Exercises - Supine Posterior Pelvic Tilt  - 2 x daily - 7 x weekly - 1 sets - 10 reps - Supine Short Arc Quad  - 2 x daily - 7 x weekly - 1 sets - 10 reps  ASSESSMENT:  CLINICAL IMPRESSION: Patient is a 62 y.o. female who was seen today for physical therapy evaluation and treatment for s/p R glut medius repair. The patient is 50% per  protocol and session worked on maintaining appropriate WB status and initiating gentle HEP. Plan to progress to tolerance per protocol.  Patient incision checked due to reports of heat.  There is area of redness anteriorly and bruising posteriorly. We discussed f/u with MD if fever, worsening edema, etc.    OBJECTIVE IMPAIRMENTS: Abnormal gait, decreased activity tolerance, decreased balance, difficulty walking, decreased ROM, decreased strength, impaired flexibility, and pain.   ACTIVITY LIMITATIONS: bending, sitting, squatting, and locomotion level  PARTICIPATION LIMITATIONS: meal prep, cleaning, and occupation  PERSONAL FACTORS: 1 comorbidity: h/o L IT band release  are also affecting patient's functional outcome.   REHAB POTENTIAL: Good  CLINICAL DECISION MAKING: Stable/uncomplicated  EVALUATION COMPLEXITY: Low   GOALS: Goals reviewed with patient? Yes  SHORT TERM GOALS: Target date: 02/09/23 The patient will be indep with HEP Baseline: initiated at evval Goal status: INITIAL  2.  Improve FOTO to 45% Baseline:  29% Goal status: INITIAL  3.  The patient will tolerate bike (raised seat due to hip flexor ROM limitations) x 10 min for activity toleance.  Baseline:  to perform Goal status: INITIAL  4.  Improve gait speed to > or equal to 1.8 ft/sec Baseline:  1.33 ft/sec Goal status: INITIAL  LONG TERM GOALS: Target date: 03/12/23  The patient will be indep  with HEP progression Baseline:  initiated at eval Goal status: INITIAL  2.  The patient will improve FOTO to > or equal to 58% Baseline:  29% Goal status: INITIAL  3.  The patient will return to normalized gait without device. Baseline:  see above Goal status: INITIAL  4.  The patient will negotiate steps with recirpocal pattern  Baseline:  step to pattern 50% WB Goal status: INITIAL  5.  The patient will improve gait speed to > or equal to 2.8 ft/sec Baseline:  1.33 ft/sec Goal status: INITIAL   PLAN:  PT FREQUENCY: 1-2x/week  PT DURATION: 8 weeks  PLANNED INTERVENTIONS: 97110-Therapeutic exercises, 97530- Therapeutic activity, 97112- Neuromuscular re-education, 97535- Self Care, 16109- Manual therapy, 97116- Gait training, 97014- Electrical stimulation (unattended), Patient/Family education, Stair training, Taping, Dry Needling, Cryotherapy, and Moist heat  PLAN FOR NEXT SESSION: check HEP, progress per protocol.    Julie-Anne Torain, PT 01/10/2023, 3:42 PM

## 2023-01-13 ENCOUNTER — Ambulatory Visit: Payer: BC Managed Care – PPO

## 2023-01-14 NOTE — Therapy (Signed)
OUTPATIENT PHYSICAL THERAPY LOWER EXTREMITY TREATMENT   Patient Name: Hailey [name] MRN: [id number] DOB:1960-12-16, 62 y.o., female Today's Date: 01/15/2023  END OF SESSION:  PT End of Session - 01/15/23 0927     Visit Number 2    Number of Visits 16    Date for PT Re-Evaluation 03/11/23    Authorization Type BCBS    PT Start Time 0928    PT Stop Time 1012    PT Time Calculation (min) 44 min    Activity Tolerance Patient tolerated treatment well    Behavior During Therapy [facility] for tasks assessed/performed              Past Medical History:  Diagnosis Date   COVID 03/2020   Endometriosis    GERD (gastroesophageal reflux disease)    Hyperprolactinemia (HCC)    MRI 1992, 2001 AND 2003. LAST MRI NORMAL.   Psoriasis    Past Surgical History:  Procedure Laterality Date   BUNIONECTOMY     DILATION AND CURETTAGE OF UTERUS  2002   AT TIME OF HYSTEROSCOPY   FOOT SURGERY     spurs   GLUTEUS MINIMUS REPAIR Right 01/07/2023   Procedure: RIGHT HIP GLUTEUS MEDIUS REPAIR,POSSIBLE COLLAGEN PATCH AUGUMENTATION;  Surgeon: Hailey Cote, MD;  Location: [facility];  Service: Orthopedics;  Laterality: Right;   HYSTEROSCOPY  2002   AT TIME OF DIAG LAP A HYSTEROSCOPY, D&C WAS DONE   IT band surg     MOUTH SURGERY     PELVIC LAPAROSCOPY  2002   DIAG LAP W LASER DONE WITH HYSTEROSCOPY,D&C   TOTAL ABDOMINAL HYSTERECTOMY  01/11/02   Patient Active Problem List   Diagnosis Date Noted   Tear of right gluteus medius tendon 01/07/2023   Unilateral primary osteoarthritis, left knee 08/04/2017   Unilateral primary osteoarthritis, right knee 08/04/2017   Chronic pain of left knee 11/04/2016   Chronic pain of right knee 11/04/2016   Trochanteric bursitis, right hip 07/17/2016   Right hand pain 04/14/2016   ACUTE PHARYNGITIS 12/23/2008   ALLERGIC RHINITIS 12/23/2008    PCP: Hailey Soho, PA-C  REFERRING PROVIDER: Ella [name]  REFERRING DIAG: R hip glut  repair  THERAPY DIAG:  Muscle weakness (generalized)  Other abnormalities of gait and mobility  Pain in right hip  Rationale for Evaluation and Treatment: Rehabilitation  ONSET DATE: Date of surgery 01/07/23  (2 WEEKS 12/3 AND 6 WEEKS IS 02/18/23)  SUBJECTIVE:   SUBJECTIVE STATEMENT: Patient reports the hip is feeling tight and twinges of pain. She uses the rollator for majority of ambulation and is walking very cautiously.   PERTINENT HISTORY: GI issues, h/o kidney stones PAIN:  Are you having pain? Yes: NPRS scale: 3-4/10 Pain location: hip Pain description: twinges during movement like sit<>stand, tight Aggravating factors: sit<>stand or after sitting for longer periods Relieving factors: gentle movement  PRECAUTIONS: Other: see protocol on file for restrictions   WEIGHT BEARING RESTRICTIONS: Yes Per Hailey [name], LAT patient is WBAT   FALLS:  Has patient fallen in last 6 months? 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  PATIENT SURVEYS:  FOTO 29%, goal to 58%   SENSATION: WFL  -- notes mild numbness lateral hip on back side of bandage  EDEMA:  Erythema and bruising noted around surgical site  POSTURE:  WNLs  LOWER EXTREMITY WGN:FAOZ hip flexion t 90 per protocolPROM abduction to 15 degrees avoided IR, ER and adduction at this time.   SAQ and pelvic tilt  Passive ROM Right eval Left eval  Hip flexion To 90 degrees without pain   Hip extension    Hip abduction 15 degrees PROM   Hip adduction    Hip internal rotation    Hip external rotation     Knee flexion    Knee extension    Ankle dorsiflexion    Ankle plantarflexion    Ankle inversion    Ankle eversion     (Blank rows = not tested)  LOWER EXTREMITY MMT: not tested-- patient demonstrates good overall strength against gravity with L LE per functional activities  MMT Right eval Left eval  Hip flexion    Hip extension    Hip abduction    Hip adduction    Hip internal rotation    Hip external rotation    Knee flexion    Knee extension    Ankle dorsiflexion    Ankle plantarflexion    Ankle inversion    Ankle eversion     (Blank rows = not tested)   FUNCTIONAL TESTS:  1.33 ft/sec gait speed   GAIT: Distance walked: with rollator RW ---- PT adjusted height so patient could use for WB through [employer] to offload R LE to match 50% WB status. Assistive device utilized: Environmental consultant - 4 wheeled Level of assistance: Modified independence Comments: Stair negotiation reviewed and loaner RW from out clinic provided-- patient was going up/down her 2 steps from garage into home without rails using husband to lean on--- not maintaining WB precautions. [facility] Adult PT Treatment:                                                DATE: 01/15/23 Therapeutic Exercise: Ankle pumps x 10  Glute sets x 10; 5 sec hold  Hip adduction isometrics x 10; 5 sec hold HS isometrics x 10; 5 sec hold Pelvic tilts x 10  Updated HEP  Manual Therapy: Passive Rt hip ROM within ROM precautions Self Care: Discussed WBAT and post-operative protocol Signs and symptoms of DVT Sleep positioning recommendations    [facility] Adult PT Treatment:                                                DATE: 01/10/23 Therapeutic Exercise: See HEP below Gait: Provided loaner RW b/c patient using walls in the home and not walker (rollator too large in home) Discussed using in middle of the night when rising to avoid injury Self Care: Discussed WB status Husband notes they were told she could do anything she wants after she  is off pain meds (reviwed ROM limitations at this time-- from protocol)    PATIENT EDUCATION:  Education details: HEP; see treatment  Person educated: Patient Education method: Explanation, Demonstration, and Handouts Education comprehension: verbalized understanding, returned demonstration, and needs further education  HOME  EXERCISE PROGRAM: Access Code: [email or web address] URL: [email or web address] Date: 01/15/2023 Prepared by: Letitia Libra  Exercises - Supine Posterior Pelvic Tilt  - 2 x daily - 7 x weekly - 1 sets - 10 reps - Supine Short Arc Quad  - 2 x daily - 7 x weekly - 1 sets - 10 reps - Supine Ankle Pumps  - 2 x daily - 7 x weekly - 2 sets - 10 reps - Supine Gluteal Sets  - 2 x daily - 7 x weekly - 2 sets - 10 reps - 5 sec hold hold - Supine Hip Adduction Isometric with Ball  - 2 x daily - 7 x weekly - 2 sets - 10 reps - 5 sec  hold - Supine Isometric Hamstring Set  - 2 x daily - 7 x weekly - 2 sets - 10 reps - 5 sec hold hold  ASSESSMENT:  CLINICAL IMPRESSION: Clarified weightbearing precautions with surgeon's athletic trainer who stated patient can be WBAT on the RLE as well as received an updated protocol. Good tolerance to PROM, though occasional guarding with movement. Completed hip isometrics without onset of pain. HEP was updated to include isometric strengthening as well as discussed protocol and weightbearing status today.   OBJECTIVE IMPAIRMENTS: Abnormal gait, decreased activity tolerance, decreased balance, difficulty walking, decreased ROM, decreased strength, impaired flexibility, and pain.   ACTIVITY LIMITATIONS: bending, sitting, squatting, and locomotion level  PARTICIPATION LIMITATIONS: meal prep, cleaning, and occupation  PERSONAL FACTORS: 1 comorbidity: h/o L IT band release  are also affecting patient's functional outcome.   REHAB POTENTIAL: Good  CLINICAL DECISION MAKING: Stable/uncomplicated  EVALUATION COMPLEXITY: Low   GOALS: Goals  reviewed with patient? Yes  SHORT TERM GOALS: Target date: 02/09/23 The patient will be indep with HEP Baseline: initiated at evval Goal status: INITIAL  2.  Improve FOTO to 45% Baseline:  29% Goal status: INITIAL  3.  The patient will tolerate bike (raised seat due to hip flexor ROM limitations) x 10 min for activity toleance.  Baseline:  to perform Goal status: INITIAL  4.  Improve gait speed to > or equal to 1.8 ft/sec Baseline:  1.33 ft/sec Goal status: INITIAL  LONG TERM GOALS: Target date: 03/12/23  The patient will be indep with HEP progression Baseline:  initiated at eval Goal status: INITIAL  2.  The patient will improve FOTO to > or equal to 58% Baseline:  29% Goal status: INITIAL  3.  The patient will return to normalized gait without device. Baseline:  see above Goal status: INITIAL  4.  The patient will negotiate steps with recirpocal pattern  Baseline:  step to pattern 50% WB Goal status: INITIAL  5.  The patient will improve gait speed to > or equal to 2.8 ft/sec Baseline:  1.33 ft/sec Goal status: INITIAL   PLAN:  PT FREQUENCY: 1-2x/week  PT DURATION: 8 weeks  PLANNED INTERVENTIONS: 97110-Therapeutic exercises, 97530- Therapeutic activity, 97112- Neuromuscular re-education, 97535- Self Care, [id number]- Manual therapy, 97116- Gait training, 97014- Electrical stimulation (unattended), Patient/Family education, Stair training, Taping, Dry Needling, Cryotherapy, and Moist heat  PLAN FOR NEXT SESSION: check HEP, progress per protocol. Per Hailey [name], LAT "She can be WBAT. If she is already >50%, no need to push her back just reiterate walker and cane for balance and to not overdo it"   Letitia Libra, PT, DPT, ATC 01/15/23 10:16 AM

## 2023-01-15 ENCOUNTER — Ambulatory Visit: Payer: BC Managed Care – PPO

## 2023-01-15 DIAGNOSIS — M25551 Pain in right hip: Secondary | ICD-10-CM | POA: Diagnosis not present

## 2023-01-15 DIAGNOSIS — M6281 Muscle weakness (generalized): Secondary | ICD-10-CM

## 2023-01-15 DIAGNOSIS — R2689 Other abnormalities of gait and mobility: Secondary | ICD-10-CM

## 2023-01-20 ENCOUNTER — Encounter (HOSPITAL_BASED_OUTPATIENT_CLINIC_OR_DEPARTMENT_OTHER): Payer: BC Managed Care – PPO | Admitting: Orthopaedic Surgery

## 2023-01-20 ENCOUNTER — Ambulatory Visit: Payer: BC Managed Care – PPO | Attending: Family Medicine

## 2023-01-20 DIAGNOSIS — M6281 Muscle weakness (generalized): Secondary | ICD-10-CM | POA: Diagnosis present

## 2023-01-20 DIAGNOSIS — M25551 Pain in right hip: Secondary | ICD-10-CM | POA: Diagnosis present

## 2023-01-20 DIAGNOSIS — R2689 Other abnormalities of gait and mobility: Secondary | ICD-10-CM | POA: Insufficient documentation

## 2023-01-20 NOTE — Therapy (Signed)
OUTPATIENT PHYSICAL THERAPY LOWER EXTREMITY TREATMENT   Patient Name: Hailey [name] MRN: [id number] DOB:1960-11-13, 62 y.o., female Today's Date: 01/20/2023  END OF SESSION:  PT End of Session - 01/20/23 1319     Visit Number 3    Number of Visits 16    Date for PT Re-Evaluation 03/11/23    Authorization Type BCBS    PT Start Time 1318    PT Stop Time 1400    PT Time Calculation (min) 42 min    Activity Tolerance Patient tolerated treatment well    Behavior During Therapy [facility] for tasks assessed/performed               Past Medical History:  Diagnosis Date   COVID 03/2020   Endometriosis    GERD (gastroesophageal reflux disease)    Hyperprolactinemia (HCC)    MRI 1992, 2001 AND 2003. LAST MRI NORMAL.   Psoriasis    Past Surgical History:  Procedure Laterality Date   BUNIONECTOMY     DILATION AND CURETTAGE OF UTERUS  2002   AT TIME OF HYSTEROSCOPY   FOOT SURGERY     spurs   GLUTEUS MINIMUS REPAIR Right 01/07/2023   Procedure: RIGHT HIP GLUTEUS MEDIUS REPAIR,POSSIBLE COLLAGEN PATCH AUGUMENTATION;  Surgeon: [name], MD;  Location: [facility];  Service: Orthopedics;  Laterality: Right;   HYSTEROSCOPY  2002   AT TIME OF DIAG LAP A HYSTEROSCOPY, D&C WAS DONE   IT band surg     MOUTH SURGERY     PELVIC LAPAROSCOPY  2002   DIAG LAP W LASER DONE WITH HYSTEROSCOPY,D&C   TOTAL ABDOMINAL HYSTERECTOMY  01/11/02   Patient Active Problem List   Diagnosis Date Noted   Tear of right gluteus medius tendon 01/07/2023   Unilateral primary osteoarthritis, left knee 08/04/2017   Unilateral primary osteoarthritis, right knee 08/04/2017   Chronic pain of left knee 11/04/2016   Chronic pain of right knee 11/04/2016   Trochanteric bursitis, right hip 07/17/2016   Right hand pain 04/14/2016   ACUTE PHARYNGITIS 12/23/2008   ALLERGIC RHINITIS 12/23/2008    PCP: Jarrett Soho, PA-C  REFERRING PROVIDER: [name]  REFERRING DIAG: R hip glut  repair  THERAPY DIAG:  Muscle weakness (generalized)  Other abnormalities of gait and mobility  Pain in right hip  Rationale for Evaluation and Treatment: Rehabilitation  ONSET DATE: Date of surgery 01/07/23  (2 WEEKS 12/3 AND 6 WEEKS IS 02/18/23)  SUBJECTIVE:   SUBJECTIVE STATEMENT: "Good. I still get some twinges of pain."   PERTINENT HISTORY: GI issues, h/o kidney stones PAIN:  Are you having pain? Yes: NPRS scale: none currently; 3 at worst/10 Pain location: RLE Pain description: twinges during movement like sit<>stand, tight Aggravating factors: sit<>stand or after sitting for longer periods Relieving factors: gentle movement  PRECAUTIONS: Other: see protocol on file for restrictions   WEIGHT BEARING RESTRICTIONS: Yes Per Ardeen Fillers, LAT patient is WBAT   FALLS:  Has patient fallen in last 6 months? 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  PATIENT SURVEYS:  FOTO 29%, goal to 58%  SENSATION: WFL  -- notes mild numbness lateral hip on back side of bandage  EDEMA:  Erythema and bruising noted around surgical site  POSTURE:  WNLs  LOWER EXTREMITY ZOX:WRUE hip flexion t 90 per protocolPROM abduction to 15 degrees avoided IR, ER and adduction at this time.   SAQ and pelvic tilt  Passive ROM Right eval Left eval 01/20/23 Right   Hip flexion To 90 degrees without pain  90 PROM  Hip extension     Hip abduction 15 degrees PROM    Hip adduction     Hip internal rotation     Hip external rotation     Knee flexion     Knee extension     Ankle  dorsiflexion     Ankle plantarflexion     Ankle inversion     Ankle eversion      (Blank rows = not tested)  LOWER EXTREMITY MMT: not tested-- patient demonstrates good overall strength against gravity with L LE per functional activities  MMT Right eval Left eval  Hip flexion    Hip extension    Hip abduction    Hip adduction    Hip internal rotation    Hip external rotation    Knee flexion    Knee extension    Ankle dorsiflexion    Ankle plantarflexion    Ankle inversion    Ankle eversion     (Blank rows = not tested)   FUNCTIONAL TESTS:  1.33 ft/sec gait speed   GAIT: Distance walked: with rollator RW ---- PT adjusted height so patient could use for WB through [employer] to offload R LE to match 50% WB status. Assistive device utilized: Environmental consultant - 4 wheeled Level of assistance: Modified independence Comments: Stair negotiation reviewed and loaner RW from out clinic provided-- patient was going up/down her 2 steps from garage into home without rails using husband to lean on--- not maintaining WB precautions. [facility] Adult PT Treatment:                                                DATE: 01/20/23 Therapeutic Exercise: SAQ 2# 2 x 10  Reviewed HEP  Manual Therapy: Passive Rt hip ROM within ROM precautions Gait training: With [facility] working on sequencing, increasing step width and step length  Stair training step to pattern with SPC and single handrail support  Self Care: Educated on hip anatomy    [facility] Adult PT Treatment:                                                DATE: 01/15/23 Therapeutic Exercise: Ankle pumps x 10  Glute sets x 10; 5 sec hold  Hip adduction isometrics x 10; 5 sec hold HS isometrics x 10; 5 sec hold Pelvic tilts x 10  Updated HEP  Manual Therapy: Passive Rt hip ROM within ROM precautions Self Care: Discussed WBAT and post-operative protocol Signs and symptoms of DVT Sleep positioning recommendations    [facility] Adult PT Treatment:  DATE: 01/10/23 Therapeutic Exercise: See HEP below Gait: Provided loaner RW b/c patient using walls in the home and not walker (rollator too large in home) Discussed using in middle of the night when rising to avoid injury Self Care: Discussed WB status Husband notes they were told she could do anything she wants after she is off pain meds (reviwed ROM limitations at this time-- from protocol)    PATIENT EDUCATION:  Education details: HEP; see treatment  Person educated: Patient Education method: Explanation, Demonstration, Tactile cues, and Verbal cues Education comprehension: verbalized understanding and returned demonstration  HOME EXERCISE PROGRAM: Access Code: [email or web address] URL: [email or web address] Date: 01/15/2023 Prepared by: Letitia Libra  Exercises - Supine Posterior Pelvic Tilt  - 2 x daily - 7 x weekly - 1 sets - 10 reps - Supine Short Arc Quad  - 2 x daily - 7 x weekly - 1 sets - 10 reps - Supine Ankle Pumps  - 2 x daily - 7 x weekly - 2 sets - 10 reps - Supine Gluteal Sets  - 2 x daily - 7 x weekly - 2 sets - 10 reps - 5 sec hold hold - Supine Hip Adduction Isometric with Ball  - 2 x daily - 7 x weekly - 2 sets - 10 reps - 5 sec  hold - Supine Isometric Hamstring Set  - 2 x daily - 7 x weekly - 2 sets - 10 reps - 5 sec hold hold  ASSESSMENT:  CLINICAL IMPRESSION: Completed gait training utilizing SPC with good tolerance. She demonstrates good stability and equal step length when ambulating in clinic with SPC with mild soreness about anterior hip. She was instructed that she could utilize [facility] for short distance household ambulation to her tolerance, but continue to use rollator for community/long distance ambulation with patient verbalizing understanding. Improved tolerance to PROM with minimal guarding noted.   OBJECTIVE IMPAIRMENTS: Abnormal gait, decreased activity tolerance, decreased balance, difficulty walking, decreased ROM,  decreased strength, impaired flexibility, and pain.   ACTIVITY LIMITATIONS: bending, sitting, squatting, and locomotion level  PARTICIPATION LIMITATIONS: meal prep, cleaning, and occupation  PERSONAL FACTORS: 1 comorbidity: h/o L IT band release  are also affecting patient's functional outcome.   REHAB POTENTIAL: Good  CLINICAL DECISION MAKING: Stable/uncomplicated  EVALUATION COMPLEXITY: Low   GOALS: Goals reviewed with patient? Yes  SHORT TERM GOALS: Target date: 02/09/23 The patient will be indep with HEP Baseline: initiated at evval Goal status: INITIAL  2.  Improve FOTO to 45% Baseline:  29% Goal status: INITIAL  3.  The patient will tolerate bike (raised seat due to hip flexor ROM limitations) x 10 min for activity toleance.  Baseline:  to perform Goal status: INITIAL  4.  Improve gait speed to > or equal to 1.8 ft/sec Baseline:  1.33 ft/sec Goal status: INITIAL  LONG TERM GOALS: Target date: 03/12/23  The patient will be indep with HEP progression Baseline:  initiated at eval Goal status: INITIAL  2.  The patient will improve FOTO to > or equal to 58% Baseline:  29% Goal status: INITIAL  3.  The patient will return to normalized gait without device. Baseline:  see above Goal status: INITIAL  4.  The patient will negotiate steps with recirpocal pattern  Baseline:  step to pattern 50% WB Goal status: INITIAL  5.  The patient will improve gait speed to > or equal to 2.8 ft/sec Baseline:  1.33 ft/sec Goal status: INITIAL   PLAN:  PT FREQUENCY: 1-2x/week  PT DURATION: 8 weeks  PLANNED INTERVENTIONS: 97110-Therapeutic exercises, 97530- Therapeutic activity, [id number]- Neuromuscular re-education, 97535- Self Care, 16109- Manual therapy, 97116- Gait training, 97014- Electrical stimulation (unattended), Patient/Family education, Stair training, Taping, Dry Needling, Cryotherapy, and Moist heat  PLAN FOR NEXT SESSION: check HEP, progress per protocol. Per  Ardeen Fillers, LAT "She can be WBAT. If she is already >50%, no need to push her back just reiterate walker and cane for balance and to not overdo it"   Letitia Libra, PT, DPT, ATC 01/20/23 2:16 PM

## 2023-01-22 ENCOUNTER — Ambulatory Visit (HOSPITAL_BASED_OUTPATIENT_CLINIC_OR_DEPARTMENT_OTHER): Payer: BC Managed Care – PPO | Admitting: Orthopaedic Surgery

## 2023-01-22 DIAGNOSIS — S76011A Strain of muscle, fascia and tendon of right hip, initial encounter: Secondary | ICD-10-CM

## 2023-01-22 NOTE — Progress Notes (Signed)
Post Operative Evaluation    Procedure/Date of Surgery: Right hip gluteus medius repair  11/19  Interval History:    Presents 2 weeks status post right hip gluteus medius repair overall doing very well.  She is walking with just a cane at home.  She has some tenderness about the lateral aspect of her hip overall this is mild.  Overall she is continue to progress with physical therapy   PMH/PSH/Family History/Social History/Meds/Allergies:    Past Medical History:  Diagnosis Date   COVID 03/2020   Endometriosis    GERD (gastroesophageal reflux disease)    Hyperprolactinemia (HCC)    MRI 1992, 2001 AND 2003. LAST MRI NORMAL.   Psoriasis    Past Surgical History:  Procedure Laterality Date   BUNIONECTOMY     DILATION AND CURETTAGE OF UTERUS  2002   AT TIME OF HYSTEROSCOPY   FOOT SURGERY     spurs   GLUTEUS MINIMUS REPAIR Right 01/07/2023   Procedure: RIGHT HIP GLUTEUS MEDIUS REPAIR,POSSIBLE COLLAGEN PATCH AUGUMENTATION;  Surgeon: [name], MD;  Location: [facility];  Service: Orthopedics;  Laterality: Right;   HYSTEROSCOPY  2002   AT TIME OF DIAG LAP A HYSTEROSCOPY, D&C WAS DONE   IT band surg     MOUTH SURGERY     PELVIC LAPAROSCOPY  2002   DIAG LAP W LASER DONE WITH HYSTEROSCOPY,D&C   TOTAL ABDOMINAL HYSTERECTOMY  01/11/02   Social History   Socioeconomic History   Marital status: Married    Spouse name: Not on file   Number of children: Not on file   Years of education: Not on file   Highest education level: Not on file  Occupational History   Not on file  Tobacco Use   Smoking status: Never   Smokeless tobacco: Never  Vaping Use   Vaping status: Never Used  Substance and Sexual Activity   Alcohol use: Yes    Comment: 0-2 month   Drug use: No   Sexual activity: Yes    Partners: Male    Birth control/protection: Surgical    Comment: HYST-1st intercourse 21 yo-5 partners  Other Topics Concern   Not  on file  Social History Narrative   Not on file   Social Determinants of Health   Financial Resource Strain: Low Risk  (09/04/2022)   Received from [facility]   Overall Financial Resource Strain (CARDIA)    Difficulty of Paying Living Expenses: Not hard at all  Food Insecurity: No Food Insecurity (09/04/2022)   Received from [facility]   Hunger Vital Sign    Worried About Running Out of Food in the Last Year: Never true    Ran Out of Food in the Last Year: Never true  Transportation Needs: No Transportation Needs (09/04/2022)   Received from [facility] - Transportation    Lack of Transportation (Medical): No    Lack of Transportation (Non-Medical): No  Physical Activity: Insufficiently Active (09/04/2022)   Received from [facility]   Exercise Vital Sign    Days of Exercise per Week: 3 days    Minutes of Exercise per Session: 30 min  Stress: No Stress Concern Present (09/04/2022)   Received from [facility] - Occupational Stress Questionnaire  Feeling of Stress : Not at all  Social Connections: Socially Integrated (09/04/2022)   Received from [facility]   Social Network    How would you rate your social network (family, work, friends)?: Good participation with social networks   Family History  Problem Relation Age of Onset   Breast cancer Mother 20   ALS Mother    Cancer Father        PAROTID GLAND   Hypertension Father    Breast cancer Maternal Aunt 71   Heart failure Maternal Aunt    Cancer Maternal Aunt        Lymphoma   Heart disease Maternal Grandmother    Heart disease Paternal Grandmother    Heart failure Maternal Uncle    Allergies  Allergen Reactions   Codeine     REACTION: Itching   Current Outpatient Medications  Medication Sig Dispense Refill   albuterol (VENTOLIN HFA) 108 (90 Base) MCG/ACT inhaler      ALPRAZolam (XANAX) 0.25 MG tablet TAKE 1 TABLET BY MOUTH EVERY 8 HOURS AS NEEDED  FOR ANXIETY 30 tablet 0   APPLE CIDER VINEGAR PO Take by mouth.     Apremilast (OTEZLA) 30 MG TABS 1 tablet     Ascorbic Acid (VITAMIN C PO) Take by mouth.       aspirin EC 325 MG tablet Take 1 tablet (325 mg total) by mouth daily. 14 tablet 0   Azelastine HCl 137 MCG/SPRAY SOLN      B Complex Vitamins (VITAMIN-B COMPLEX PO) Take by mouth.       Calcium Carbonate-Vitamin D (CALCIUM + D PO) Take by mouth.       cetirizine (ZYRTEC) 10 MG tablet Take 10 mg by mouth daily.     Cholecalciferol (VITAMIN D3 PO) Take by mouth.     CINNAMON PO Take by mouth.     clobetasol cream (TEMOVATE) 0.05 % APP ON THE SKIN BID PRN  0   Cranberry 500 MG TABS Take by mouth.     cycloSPORINE (RESTASIS) 0.05 % ophthalmic emulsion 1 drop 2 (two) times daily.     diclofenac Sodium (VOLTAREN) 1 % GEL Apply 4 g topically 4 (four) times daily. (Patient not taking: Reported on 01/10/2023) 350 g 1   dicyclomine (BENTYL) 10 MG capsule Take by mouth. 60mg  daily     fluticasone (FLONASE) 50 MCG/ACT nasal spray      ipratropium (ATROVENT) 0.06 % nasal spray      MAGNESIUM PO Take by mouth.     Multiple Vitamins-Iron (MULTIVITAMIN/IRON PO) Take by mouth.       Multiple Vitamins-Minerals (ZINC PO) Take by mouth.     nystatin-triamcinolone ointment (MYCOLOG) Apply 1 application topically 2 (two) times daily. 30 g 0   Omega-3 Fatty Acids (FISH OIL PO) Take by mouth.       oxyCODONE (ROXICODONE) 5 MG immediate release tablet Take 1 tablet (5 mg total) by mouth every 4 (four) hours as needed for severe pain (pain score 7-10) or breakthrough pain. 10 tablet 0   POTASSIUM PO Take by mouth.       Probiotic Product (PROBIOTIC-10 PO) Take by mouth.     Red Yeast Rice Extract (RED YEAST RICE PO) Take by mouth.     Turmeric 400 MG CAPS Take by mouth.     UNABLE TO FIND Liver care capsules Stress care caps [facility] - hot flushes shatavari - supplement     vitamin B-12 (CYANOCOBALAMIN) 100 MCG tablet  VITAMIN E PO Take by mouth.        No current facility-administered medications for this visit.   No results found.  Review of Systems:   A ROS was performed including pertinent positives and negatives as documented in the HPI.   Musculoskeletal Exam:     Right hip incision is well-appearing without erythema or drainage.  Range of motion is 30 degrees internal/external rotation.  Abduction not tested today.  Walks with mild antalgic gait.  Distal neurosensory exam is intact.  Imaging:      I personally reviewed and interpreted the radiographs.   Assessment:   2 weeks status post right gluteus medius tendon repair overall doing very well.  At this time she will continue to advance her weightbearing to as tolerated.  She will continue to follow the gluteus repair protocol.  I will plan to see her back in 4 weeks for reassessment  Plan :    -Return to clinic 4 weeks for reassessment      I personally saw and evaluated the patient, and participated in the management and treatment plan.  [name], MD Attending Physician, Orthopedic Surgery  This document was dictated using Dragon voice recognition software. A reasonable attempt at proof reading has been made to minimize errors.

## 2023-01-29 ENCOUNTER — Ambulatory Visit: Payer: BC Managed Care – PPO

## 2023-01-29 DIAGNOSIS — M6281 Muscle weakness (generalized): Secondary | ICD-10-CM

## 2023-01-29 DIAGNOSIS — M25551 Pain in right hip: Secondary | ICD-10-CM

## 2023-01-29 DIAGNOSIS — R2689 Other abnormalities of gait and mobility: Secondary | ICD-10-CM

## 2023-01-29 NOTE — Therapy (Signed)
OUTPATIENT PHYSICAL THERAPY LOWER EXTREMITY TREATMENT   Patient Name: Hailey [name] MRN: [id number] DOB:16-Oct-1960, 62 y.o., female Today's Date: 01/29/2023  END OF SESSION:  PT End of Session - 01/29/23 0846     Visit Number 4    Number of Visits 16    Date for PT Re-Evaluation 03/11/23    Authorization Type BCBS    PT Start Time 0846    PT Stop Time 0925    PT Time Calculation (min) 39 min    Activity Tolerance Patient tolerated treatment well    Behavior During Therapy [facility] for tasks assessed/performed               Past Medical History:  Diagnosis Date   COVID 03/2020   Endometriosis    GERD (gastroesophageal reflux disease)    Hyperprolactinemia (HCC)    MRI 1992, 2001 AND 2003. LAST MRI NORMAL.   Psoriasis    Past Surgical History:  Procedure Laterality Date   BUNIONECTOMY     DILATION AND CURETTAGE OF UTERUS  2002   AT TIME OF HYSTEROSCOPY   FOOT SURGERY     spurs   GLUTEUS MINIMUS REPAIR Right 01/07/2023   Procedure: RIGHT HIP GLUTEUS MEDIUS REPAIR,POSSIBLE COLLAGEN PATCH AUGUMENTATION;  Surgeon: [name], MD;  Location: [facility];  Service: Orthopedics;  Laterality: Right;   HYSTEROSCOPY  2002   AT TIME OF DIAG LAP A HYSTEROSCOPY, D&C WAS DONE   IT band surg     MOUTH SURGERY     PELVIC LAPAROSCOPY  2002   DIAG LAP W LASER DONE WITH HYSTEROSCOPY,D&C   TOTAL ABDOMINAL HYSTERECTOMY  01/11/02   Patient Active Problem List   Diagnosis Date Noted   Tear of right gluteus medius tendon 01/07/2023   Unilateral primary osteoarthritis, left knee 08/04/2017   Unilateral primary osteoarthritis, right knee 08/04/2017   Chronic pain of left knee 11/04/2016   Chronic pain of right knee 11/04/2016   Trochanteric bursitis, right hip 07/17/2016   Right hand pain 04/14/2016   ACUTE PHARYNGITIS 12/23/2008   ALLERGIC RHINITIS 12/23/2008    PCP: Jarrett Soho, PA-C  REFERRING PROVIDER: [name]  REFERRING DIAG: R hip glut  repair  THERAPY DIAG:  Muscle weakness (generalized)  Other abnormalities of gait and mobility  Pain in right hip  Rationale for Evaluation and Treatment: Rehabilitation  ONSET DATE: Date of surgery 01/07/23  (2 WEEKS 12/3 AND 6 WEEKS IS 02/18/23)  SUBJECTIVE:   SUBJECTIVE STATEMENT: "Much better with the bandage off." She is back working 3-4 hours a day.   PERTINENT HISTORY: GI issues, h/o kidney stones PAIN:  Are you having pain? Yes: NPRS scale: none currently; 3 at worst/10 Pain location: RLE Pain description: twinges during movement like sit<>stand, tight Aggravating factors: sit<>stand or after sitting for longer periods Relieving factors: gentle movement  PRECAUTIONS: Other: see protocol on file for restrictions   WEIGHT BEARING RESTRICTIONS: Yes Per Ardeen Fillers, LAT patient is WBAT   FALLS:  Has patient fallen in last 6 months? 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  PATIENT SURVEYS:  FOTO 29%, goal to 58%   SENSATION: WFL  -- notes mild numbness lateral hip on back side of bandage  EDEMA:  Erythema and bruising noted around surgical site  POSTURE:  WNLs  LOWER EXTREMITY WJX:BJYN hip flexion t 90 per protocolPROM abduction to 15 degrees avoided IR, ER and adduction at this time.   SAQ and pelvic tilt  Passive ROM Right eval Left eval 01/20/23 Right   Hip flexion To 90 degrees without pain  90 PROM  Hip extension     Hip abduction 15 degrees PROM    Hip adduction     Hip internal rotation     Hip external rotation     Knee flexion      Knee extension     Ankle dorsiflexion     Ankle plantarflexion     Ankle inversion     Ankle eversion      (Blank rows = not tested)  LOWER EXTREMITY MMT: not tested-- patient demonstrates good overall strength against gravity with L LE per functional activities  MMT Right eval Left eval  Hip flexion    Hip extension    Hip abduction    Hip adduction    Hip internal rotation    Hip external rotation    Knee flexion    Knee extension    Ankle dorsiflexion    Ankle plantarflexion    Ankle inversion    Ankle eversion     (Blank rows = not tested)   FUNCTIONAL TESTS:  1.33 ft/sec gait speed   GAIT: Distance walked: with rollator RW ---- PT adjusted height so patient could use for WB through [employer] to offload R LE to match 50% WB status. Assistive device utilized: Environmental consultant - 4 wheeled Level of assistance: Modified independence Comments: Stair negotiation reviewed and loaner RW from out clinic provided-- patient was going up/down her 2 steps from garage into home without rails using husband to lean on--- not maintaining WB precautions. [facility] Adult PT Treatment:                                                DATE: 01/29/23 Therapeutic Exercise: Quadruped rocking x 10  Glute sets in hooklying x 10  Reviewed and updated HEP  Manual Therapy: Passive Rt hip ROM within ROM precautions  Self Care: Sleep positioning recommendation Continue ice for pain control    [facility] Adult PT Treatment:                                                DATE: 01/20/23 Therapeutic Exercise: SAQ 2# 2 x 10  Reviewed HEP  Manual Therapy: Passive Rt hip ROM within ROM precautions Gait training: With [facility] working on sequencing, increasing step width and step length  Stair training step to pattern with SPC and single handrail support  Self Care: Educated on hip anatomy    [facility] Adult PT Treatment:                                                DATE: 01/15/23 Therapeutic Exercise: Ankle pumps x  10   Glute sets x 10; 5 sec hold  Hip adduction isometrics x 10; 5 sec hold HS isometrics x 10; 5 sec hold Pelvic tilts x 10  Updated HEP  Manual Therapy: Passive Rt hip ROM within ROM precautions Self Care: Discussed WBAT and post-operative protocol Signs and symptoms of DVT Sleep positioning recommendations    [facility] Adult PT Treatment:                                                DATE: 01/10/23 Therapeutic Exercise: See HEP below Gait: Provided loaner RW b/c patient using walls in the home and not walker (rollator too large in home) Discussed using in middle of the night when rising to avoid injury Self Care: Discussed WB status Husband notes they were told she could do anything she wants after she is off pain meds (reviwed ROM limitations at this time-- from protocol)    PATIENT EDUCATION:  Education details: HEP; see treatment  Person educated: Patient Education method: Explanation, Demonstration, Tactile cues, Verbal cues, and Handouts Education comprehension: verbalized understanding and returned demonstration  HOME EXERCISE PROGRAM: Access Code: [email or web address] URL: [email or web address] Date: 01/29/2023 Prepared by: Letitia Libra  Exercises - Supine Posterior Pelvic Tilt  - 2 x daily - 7 x weekly - 1 sets - 10 reps - Supine Short Arc Quad  - 2 x daily - 7 x weekly - 1 sets - 10 reps - Supine Gluteal Sets  - 2 x daily - 7 x weekly - 2 sets - 10 reps - 5 sec hold hold - Supine Hip Adduction Isometric with Ball  - 2 x daily - 7 x weekly - 2 sets - 10 reps - 5 sec  hold - Supine Isometric Hamstring Set  - 2 x daily - 7 x weekly - 2 sets - 10 reps - 5 sec hold hold - Quadruped Rocking Slow  - 2 x daily - 7 x weekly - 1 sets - 10 reps  ASSESSMENT:  CLINICAL IMPRESSION: Good tolerance to PROM today without guarding. PROM is progressing well per post-operative protocol. Introduced quadruped rocking today with patient reporting slight pull about distal IT band, but  otherwise good tolerance to ROM progression.   OBJECTIVE IMPAIRMENTS: Abnormal gait, decreased activity tolerance, decreased balance, difficulty walking, decreased ROM, decreased strength, impaired flexibility, and pain.   ACTIVITY LIMITATIONS: bending, sitting, squatting, and locomotion level  PARTICIPATION LIMITATIONS: meal prep, cleaning, and occupation  PERSONAL FACTORS: 1 comorbidity: h/o L IT band release  are also affecting patient's functional outcome.   REHAB POTENTIAL: Good  CLINICAL DECISION MAKING: Stable/uncomplicated  EVALUATION COMPLEXITY: Low   GOALS: Goals reviewed with patient? Yes  SHORT TERM GOALS: Target date: 02/09/23 The patient will be indep with HEP Baseline: initiated at evval Goal status: INITIAL  2.  Improve FOTO to 45% Baseline:  29% Goal status: INITIAL  3.  The patient will tolerate bike (raised seat due to hip flexor ROM limitations) x 10 min for activity toleance.  Baseline:  to perform Goal status: INITIAL  4.  Improve gait speed to > or equal to 1.8 ft/sec Baseline:  1.33 ft/sec Goal status: INITIAL  LONG TERM GOALS: Target date: 03/12/23  The patient will be indep with HEP progression Baseline:  initiated at eval Goal status: INITIAL  2.  The patient will improve FOTO to >  or equal to 58% Baseline:  29% Goal status: INITIAL  3.  The patient will return to normalized gait without device. Baseline:  see above Goal status: INITIAL  4.  The patient will negotiate steps with recirpocal pattern  Baseline:  step to pattern 50% WB Goal status: INITIAL  5.  The patient will improve gait speed to > or equal to 2.8 ft/sec Baseline:  1.33 ft/sec Goal status: INITIAL   PLAN:  PT FREQUENCY: 1-2x/week  PT DURATION: 8 weeks  PLANNED INTERVENTIONS: 97110-Therapeutic exercises, 97530- Therapeutic activity, 97112- Neuromuscular re-education, 97535- Self Care, 65784- Manual therapy, 97116- Gait training, 97014- Electrical stimulation  (unattended), Patient/Family education, Stair training, Taping, Dry Needling, Cryotherapy, and Moist heat  PLAN FOR NEXT SESSION: check HEP, progress per protocol. Per Ardeen Fillers, LAT "She can be WBAT. If she is already >50%, no need to push her back just reiterate walker and cane for balance and to not overdo it"   Letitia Libra, PT, DPT, ATC 01/29/23 9:27 AM

## 2023-02-04 ENCOUNTER — Ambulatory Visit: Payer: BC Managed Care – PPO

## 2023-02-04 DIAGNOSIS — M6281 Muscle weakness (generalized): Secondary | ICD-10-CM

## 2023-02-04 DIAGNOSIS — R2689 Other abnormalities of gait and mobility: Secondary | ICD-10-CM

## 2023-02-04 DIAGNOSIS — M25551 Pain in right hip: Secondary | ICD-10-CM

## 2023-02-04 NOTE — Therapy (Signed)
OUTPATIENT PHYSICAL THERAPY LOWER EXTREMITY TREATMENT   Patient Name: Hailey [name] MRN: [id number] DOB:[birth year], 62 y.o., female Today's Date: 02/04/2023  END OF SESSION:  PT End of Session - 02/04/23 1530     Visit Number 5    Number of Visits 16    Date for PT Re-Evaluation 03/11/23    Authorization Type BCBS    PT Start Time 1532    PT Stop Time 1611    PT Time Calculation (min) 39 min    Activity Tolerance Patient tolerated treatment well    Behavior During Therapy [facility] for tasks assessed/performed                Past Medical History:  Diagnosis Date   COVID 03/2020   Endometriosis    GERD (gastroesophageal reflux disease)    Hyperprolactinemia (HCC)    MRI 1992, 2001 AND 2003. LAST MRI NORMAL.   Psoriasis    Past Surgical History:  Procedure Laterality Date   BUNIONECTOMY     DILATION AND CURETTAGE OF UTERUS  2002   AT TIME OF HYSTEROSCOPY   FOOT SURGERY     spurs   GLUTEUS MINIMUS REPAIR Right 01/07/2023   Procedure: RIGHT HIP GLUTEUS MEDIUS REPAIR,POSSIBLE COLLAGEN PATCH AUGUMENTATION;  Surgeon: [name], MD;  Location: [facility];  Service: Orthopedics;  Laterality: Right;   HYSTEROSCOPY  2002   AT TIME OF DIAG LAP A HYSTEROSCOPY, D&C WAS DONE   IT band surg     MOUTH SURGERY     PELVIC LAPAROSCOPY  2002   DIAG LAP W LASER DONE WITH HYSTEROSCOPY,D&C   TOTAL ABDOMINAL HYSTERECTOMY  01/11/02   Patient Active Problem List   Diagnosis Date Noted   Tear of right gluteus medius tendon 01/07/2023   Unilateral primary osteoarthritis, left knee 08/04/2017   Unilateral primary osteoarthritis, right knee 08/04/2017   Chronic pain of left knee 11/04/2016   Chronic pain of right knee 11/04/2016   Trochanteric bursitis, right hip 07/17/2016   Right hand pain 04/14/2016   ACUTE PHARYNGITIS 12/23/2008   ALLERGIC RHINITIS 12/23/2008    PCP: Jarrett Soho, PA-C  REFERRING PROVIDER: [name]  REFERRING DIAG: R hip glut  repair  THERAPY DIAG:  Muscle weakness (generalized)  Other abnormalities of gait and mobility  Pain in right hip  Rationale for Evaluation and Treatment: Rehabilitation  ONSET DATE: Date of surgery 01/07/23  (2 WEEKS 12/3 AND 6 WEEKS IS 02/18/23)  SUBJECTIVE:   SUBJECTIVE STATEMENT: Patient reports the hip is fine. Steri strips are off.   PERTINENT HISTORY: GI issues, h/o kidney stones PAIN:  Are you having pain? Yes: NPRS scale: none currently; 2 at worst/10 Pain location: RLE Pain description: twinges and pulling  Aggravating factors: sit<>stand or after sitting for longer periods Relieving factors: gentle movement  PRECAUTIONS: Other: see protocol on file for restrictions   WEIGHT BEARING RESTRICTIONS: Yes Per Ardeen Fillers, LAT patient is WBAT   FALLS:  Has patient fallen in last 6 months? 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  PATIENT SURVEYS:  FOTO 29%, goal to  58%   SENSATION: WFL  -- notes mild numbness lateral hip on back side of bandage  EDEMA:  Erythema and bruising noted around surgical site  POSTURE:  WNLs  LOWER EXTREMITY FTD:DUKG hip flexion t 90 per protocolPROM abduction to 15 degrees avoided IR, ER and adduction at this time.   SAQ and pelvic tilt  Passive ROM Right eval Left eval 01/20/23 Right  02/04/23 Right   Hip flexion To 90 degrees without pain  90 PROM 108 PROM   Hip extension      Hip abduction 15 degrees PROM     Hip adduction      Hip internal rotation      Hip external rotation      Knee flexion      Knee  extension      Ankle dorsiflexion      Ankle plantarflexion      Ankle inversion      Ankle eversion       (Blank rows = not tested)  LOWER EXTREMITY MMT: not tested-- patient demonstrates good overall strength against gravity with L LE per functional activities  MMT Right eval Left eval  Hip flexion    Hip extension    Hip abduction    Hip adduction    Hip internal rotation    Hip external rotation    Knee flexion    Knee extension    Ankle dorsiflexion    Ankle plantarflexion    Ankle inversion    Ankle eversion     (Blank rows = not tested)   FUNCTIONAL TESTS:  1.33 ft/sec gait speed   GAIT: Distance walked: with rollator RW ---- PT adjusted height so patient could use for WB through [employer] to offload R LE to match 50% WB status. Assistive device utilized: Environmental consultant - 4 wheeled Level of assistance: Modified independence Comments: Stair negotiation reviewed and loaner RW from out clinic provided-- patient was going up/down her 2 steps from garage into home without rails using husband to lean on--- not maintaining WB precautions. [facility] Adult PT Treatment:                                                DATE: 02/04/23 Therapeutic Exercise: Hooklying isometric hip abduction and flexion x 10 each ; 5 sec hold Hip bridge x 10  Updated HEP  Manual Therapy: Scar tissue massage STM gluteals and hip flexors Rt Passive hip flexion and abduction   Self Care: Desensitization techniques with handout provided.    [facility] Adult PT Treatment:                                                DATE: 01/29/23 Therapeutic Exercise: Quadruped rocking x 10  Glute sets in hooklying x 10  Reviewed and updated HEP  Manual Therapy: Passive Rt hip ROM within ROM precautions  Self Care: Sleep positioning recommendation Continue ice for pain control    [facility] Adult PT Treatment:  DATE: 01/20/23 Therapeutic Exercise: SAQ 2# 2 x 10  Reviewed HEP   Manual Therapy: Passive Rt hip ROM within ROM precautions Gait training: With [facility] working on sequencing, increasing step width and step length  Stair training step to pattern with SPC and single handrail support  Self Care: Educated on hip anatomy    [facility] Adult PT Treatment:                                                DATE: 01/15/23 Therapeutic Exercise: Ankle pumps x 10  Glute sets x 10; 5 sec hold  Hip adduction isometrics x 10; 5 sec hold HS isometrics x 10; 5 sec hold Pelvic tilts x 10  Updated HEP  Manual Therapy: Passive Rt hip ROM within ROM precautions Self Care: Discussed WBAT and post-operative protocol Signs and symptoms of DVT Sleep positioning recommendations    [facility] Adult PT Treatment:                                                DATE: 01/10/23 Therapeutic Exercise: See HEP below Gait: Provided loaner RW b/c patient using walls in the home and not walker (rollator too large in home) Discussed using in middle of the night when rising to avoid injury Self Care: Discussed WB status Husband notes they were told she could do anything she wants after she is off pain meds (reviwed ROM limitations at this time-- from protocol)    PATIENT EDUCATION:  Education details: HEP; see treatment  Person educated: Patient Education method: Explanation, Demonstration, Tactile cues, Verbal cues, and Handouts Education comprehension: verbalized understanding and returned demonstration  HOME EXERCISE PROGRAM: Access Code: [email or web address] URL: [email or web address] Date: 02/04/2023 Prepared by: Letitia Libra  Exercises - Supine Posterior Pelvic Tilt  - 2 x daily - 7 x weekly - 1 sets - 10 reps - Supine Short Arc Quad  - 2 x daily - 7 x weekly - 1 sets - 10 reps - Supine Gluteal Sets  - 2 x daily - 7 x weekly - 2 sets - 10 reps - 5 sec hold hold - Supine Hip Adduction Isometric with Ball  - 2 x daily - 7 x weekly - 2 sets - 10 reps - 5 sec  hold - Supine  Isometric Hamstring Set  - 2 x daily - 7 x weekly - 2 sets - 10 reps - 5 sec hold hold - Quadruped Rocking Slow  - 2 x daily - 7 x weekly - 1 sets - 10 reps - Hooklying Isometric Hip Abduction with Belt  - 1 x daily - 7 x weekly - 2 sets - 10 reps - 5 sec  hold - Hooklying Isometric Hip Flexion  - 1 x daily - 7 x weekly - 2 sets - 10 reps - 5 sec  hold - Supine Bridge  - 1 x daily - 7 x weekly - 2 sets - 10 reps  Patient Education - Rubbing with Different Textures  ASSESSMENT:  CLINICAL IMPRESSION: Patient is 4 weeks post-op today, so progressed per post-operative protocol on file. Noted to have 108 degrees of passive hip flexion ROM reporting pinching about the groin at end range. Notable restriction about scar tissue with  sensitivity to light palpation. Discussed desensitization techniques to perform at home to reduce hypersensitivity with handout provided. Progressed hip isometrics without onset of pain.   OBJECTIVE IMPAIRMENTS: Abnormal gait, decreased activity tolerance, decreased balance, difficulty walking, decreased ROM, decreased strength, impaired flexibility, and pain.   ACTIVITY LIMITATIONS: bending, sitting, squatting, and locomotion level  PARTICIPATION LIMITATIONS: meal prep, cleaning, and occupation  PERSONAL FACTORS: 1 comorbidity: h/o L IT band release  are also affecting patient's functional outcome.   REHAB POTENTIAL: Good  CLINICAL DECISION MAKING: Stable/uncomplicated  EVALUATION COMPLEXITY: Low   GOALS: Goals reviewed with patient? Yes  SHORT TERM GOALS: Target date: 02/09/23 The patient will be indep with HEP Baseline: initiated at evval Goal status: INITIAL  2.  Improve FOTO to 45% Baseline:  29% Goal status: INITIAL  3.  The patient will tolerate bike (raised seat due to hip flexor ROM limitations) x 10 min for activity toleance.  Baseline:  to perform Goal status: INITIAL  4.  Improve gait speed to > or equal to 1.8 ft/sec Baseline:  1.33  ft/sec Goal status: INITIAL  LONG TERM GOALS: Target date: 03/12/23  The patient will be indep with HEP progression Baseline:  initiated at eval Goal status: INITIAL  2.  The patient will improve FOTO to > or equal to 58% Baseline:  29% Goal status: INITIAL  3.  The patient will return to normalized gait without device. Baseline:  see above Goal status: INITIAL  4.  The patient will negotiate steps with recirpocal pattern  Baseline:  step to pattern 50% WB Goal status: INITIAL  5.  The patient will improve gait speed to > or equal to 2.8 ft/sec Baseline:  1.33 ft/sec Goal status: INITIAL   PLAN:  PT FREQUENCY: 1-2x/week  PT DURATION: 8 weeks  PLANNED INTERVENTIONS: 97110-Therapeutic exercises, 97530- Therapeutic activity, 97112- Neuromuscular re-education, 97535- Self Care, 16109- Manual therapy, 97116- Gait training, 97014- Electrical stimulation (unattended), Patient/Family education, Stair training, Taping, Dry Needling, Cryotherapy, and Moist heat  PLAN FOR NEXT SESSION: check HEP, progress per protocol.    Letitia Libra, PT, DPT, ATC 02/04/23 4:15 PM

## 2023-02-11 ENCOUNTER — Ambulatory Visit: Payer: BC Managed Care – PPO

## 2023-02-11 DIAGNOSIS — M25551 Pain in right hip: Secondary | ICD-10-CM

## 2023-02-11 DIAGNOSIS — M6281 Muscle weakness (generalized): Secondary | ICD-10-CM | POA: Diagnosis not present

## 2023-02-11 DIAGNOSIS — R2689 Other abnormalities of gait and mobility: Secondary | ICD-10-CM

## 2023-02-11 NOTE — Therapy (Signed)
OUTPATIENT PHYSICAL THERAPY LOWER EXTREMITY TREATMENT   Patient Name: Hailey [name] MRN: [id number] DOB:30-Jan-1961, 62 y.o., female Today's Date: 02/11/2023  END OF SESSION:  PT End of Session - 02/11/23 1027     Visit Number 6    Number of Visits 16    Date for PT Re-Evaluation 03/11/23    Authorization Type BCBS    PT Start Time 1027   patient late   PT Stop Time 1059    PT Time Calculation (min) 32 min    Activity Tolerance Patient tolerated treatment well    Behavior During Therapy [facility] for tasks assessed/performed                Past Medical History:  Diagnosis Date   COVID 03/2020   Endometriosis    GERD (gastroesophageal reflux disease)    Hyperprolactinemia (HCC)    MRI 1992, 2001 AND 2003. LAST MRI NORMAL.   Psoriasis    Past Surgical History:  Procedure Laterality Date   BUNIONECTOMY     DILATION AND CURETTAGE OF UTERUS  2002   AT TIME OF HYSTEROSCOPY   FOOT SURGERY     spurs   GLUTEUS MINIMUS REPAIR Right 01/07/2023   Procedure: RIGHT HIP GLUTEUS MEDIUS REPAIR,POSSIBLE COLLAGEN PATCH AUGUMENTATION;  Surgeon: [name], MD;  Location: [facility];  Service: Orthopedics;  Laterality: Right;   HYSTEROSCOPY  2002   AT TIME OF DIAG LAP A HYSTEROSCOPY, D&C WAS DONE   IT band surg     MOUTH SURGERY     PELVIC LAPAROSCOPY  2002   DIAG LAP W LASER DONE WITH HYSTEROSCOPY,D&C   TOTAL ABDOMINAL HYSTERECTOMY  01/11/02   Patient Active Problem List   Diagnosis Date Noted   Tear of right gluteus medius tendon 01/07/2023   Unilateral primary osteoarthritis, left knee 08/04/2017   Unilateral primary osteoarthritis, right knee 08/04/2017   Chronic pain of left knee 11/04/2016   Chronic pain of right knee 11/04/2016   Trochanteric bursitis, right hip 07/17/2016   Right hand pain 04/14/2016   ACUTE PHARYNGITIS 12/23/2008   ALLERGIC RHINITIS 12/23/2008    PCP: Jarrett Soho, PA-C  REFERRING PROVIDER: [name]  REFERRING DIAG: R hip  glut repair  THERAPY DIAG:  Muscle weakness (generalized)  Other abnormalities of gait and mobility  Pain in right hip  Rationale for Evaluation and Treatment: Rehabilitation  ONSET DATE: Date of surgery 01/07/23  (2 WEEKS 12/3 AND 6 WEEKS IS 02/18/23)  SUBJECTIVE:   SUBJECTIVE STATEMENT: On Saturday she felt a pop in the front of the hip when she bent over. She iced over the weekend and that helped reduce her pain.   PERTINENT HISTORY: GI issues, h/o kidney stones PAIN:  Are you having pain? Yes: NPRS scale: none currently; 2 at worst/10 Pain location: RLE Pain description: twinges and pulling  Aggravating factors: sit<>stand or after sitting for longer periods Relieving factors: gentle movement  PRECAUTIONS: Other: see protocol on file for restrictions   WEIGHT BEARING RESTRICTIONS: Yes Per Ardeen Fillers, LAT patient is WBAT   FALLS:  Has patient fallen in last 6 months? 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  PATIENT SURVEYS:  FOTO 29%, goal to 58%  02/11/23: 55% function  SENSATION: WFL  -- notes mild numbness lateral hip on back side of bandage  EDEMA:  Erythema and bruising noted around surgical site  POSTURE:  WNLs  LOWER EXTREMITY ZOX:WRUE hip flexion t 90 per protocolPROM abduction to 15 degrees avoided IR, ER and adduction at this time.   SAQ and pelvic tilt  Passive ROM Right eval Left eval 01/20/23 Right  02/04/23 Right   Hip flexion To 90 degrees without pain  90 PROM 108 PROM   Hip extension      Hip abduction 15 degrees PROM      Hip adduction      Hip internal rotation      Hip external rotation      Knee flexion      Knee extension      Ankle dorsiflexion      Ankle plantarflexion      Ankle inversion      Ankle eversion       (Blank rows = not tested)  LOWER EXTREMITY MMT: not tested-- patient demonstrates good overall strength against gravity with L LE per functional activities  MMT Right eval Left eval  Hip flexion    Hip extension    Hip abduction    Hip adduction    Hip internal rotation    Hip external rotation    Knee flexion    Knee extension    Ankle dorsiflexion    Ankle plantarflexion    Ankle inversion    Ankle eversion     (Blank rows = not tested)   FUNCTIONAL TESTS:  1.33 ft/sec gait speed   GAIT: Distance walked: with rollator RW ---- PT adjusted height so patient could use for WB through [employer] to offload R LE to match 50% WB status. Assistive device utilized: Environmental consultant - 4 wheeled Level of assistance: Modified independence Comments: Stair negotiation reviewed and loaner RW from out clinic provided-- patient was going up/down her 2 steps from garage into home without rails using husband to lean on--- not maintaining WB precautions. [facility] Adult PT Treatment:                                                DATE: 02/11/23 Therapeutic Exercise: LAQ 2 x 10; 2#  Resisted HS curl red band 2 x 10  Standing calf raise 2 x 10  Manual Therapy: Passive Rt hip flexion, abduction, IR ROM to tolerance  STM Rt hip flexors    [facility] Adult PT Treatment:                                                DATE: 02/04/23 Therapeutic Exercise: Hooklying isometric hip abduction and flexion x 10 each ; 5 sec hold Hip bridge x 10  Updated HEP  Manual Therapy: Scar tissue massage STM gluteals and hip flexors Rt Passive hip flexion and abduction   Self Care: Desensitization techniques with handout provided.    [facility] Adult PT Treatment:  DATE:  01/29/23 Therapeutic Exercise: Quadruped rocking x 10  Glute sets in hooklying x 10  Reviewed and updated HEP  Manual Therapy: Passive Rt hip ROM within ROM precautions  Self Care: Sleep positioning recommendation Continue ice for pain control     PATIENT EDUCATION:  Education details: HEP review  Person educated: Patient Education method: Explanation Education comprehension: verbalized understanding  HOME EXERCISE PROGRAM: Access Code: [email or web address] URL: [email or web address] Date: 02/04/2023 Prepared by: Letitia Libra  Exercises - Supine Posterior Pelvic Tilt  - 2 x daily - 7 x weekly - 1 sets - 10 reps - Supine Short Arc Quad  - 2 x daily - 7 x weekly - 1 sets - 10 reps - Supine Gluteal Sets  - 2 x daily - 7 x weekly - 2 sets - 10 reps - 5 sec hold hold - Supine Hip Adduction Isometric with Ball  - 2 x daily - 7 x weekly - 2 sets - 10 reps - 5 sec  hold - Supine Isometric Hamstring Set  - 2 x daily - 7 x weekly - 2 sets - 10 reps - 5 sec hold hold - Quadruped Rocking Slow  - 2 x daily - 7 x weekly - 1 sets - 10 reps - Hooklying Isometric Hip Abduction with Belt  - 1 x daily - 7 x weekly - 2 sets - 10 reps - 5 sec  hold - Hooklying Isometric Hip Flexion  - 1 x daily - 7 x weekly - 2 sets - 10 reps - 5 sec  hold - Supine Bridge  - 1 x daily - 7 x weekly - 2 sets - 10 reps  Patient Education - Rubbing with Different Textures  ASSESSMENT:  CLINICAL IMPRESSION: Session somewhat limited as patient was late for scheduled visit. Good tolerance to hip PROM per protocol without onset of pain. Progressed strengthening per protocol introducing quad and hamstring strengthening today with good tolerance reporting mild pulling in her hip flexor with quad strengthening. FOTO score has significantly improved having met this STG.   OBJECTIVE IMPAIRMENTS: Abnormal gait, decreased activity tolerance, decreased balance, difficulty walking, decreased ROM, decreased strength,  impaired flexibility, and pain.   ACTIVITY LIMITATIONS: bending, sitting, squatting, and locomotion level  PARTICIPATION LIMITATIONS: meal prep, cleaning, and occupation  PERSONAL FACTORS: 1 comorbidity: h/o L IT band release  are also affecting patient's functional outcome.   REHAB POTENTIAL: Good  CLINICAL DECISION MAKING: Stable/uncomplicated  EVALUATION COMPLEXITY: Low   GOALS: Goals reviewed with patient? Yes  SHORT TERM GOALS: Target date: 02/09/23 The patient will be indep with HEP Baseline: initiated at evval Goal status: MET  2.  Improve FOTO to 45% Baseline:  29% Goal status: MET  3.  The patient will tolerate bike (raised seat due to hip flexor ROM limitations) x 10 min for activity toleance.  Baseline:  to perform Goal status: INITIAL  4.  Improve gait speed to > or equal to 1.8 ft/sec Baseline:  1.33 ft/sec Goal status: INITIAL  LONG TERM GOALS: Target date: 03/12/23  The patient will be indep with HEP progression Baseline:  initiated at eval Goal status: INITIAL  2.  The patient will improve FOTO to > or equal to 58% Baseline:  29% Goal status: INITIAL  3.  The patient will return to normalized gait without device. Baseline:  see above Goal status: INITIAL  4.  The patient will negotiate steps with recirpocal pattern  Baseline:  step to pattern 50% WB Goal status: INITIAL  5.  The patient will improve gait speed to > or equal to 2.8 ft/sec Baseline:  1.33 ft/sec Goal status: INITIAL   PLAN:  PT FREQUENCY: 1-2x/week  PT DURATION: 8 weeks  PLANNED INTERVENTIONS: 97110-Therapeutic exercises, 97530- Therapeutic activity, 97112- Neuromuscular re-education, 97535- Self Care, 40981- Manual therapy, 97116- Gait training, 97014- Electrical stimulation (unattended), Patient/Family education, Stair training, Taping, Dry Needling, Cryotherapy, and Moist heat  PLAN FOR NEXT SESSION: check HEP, progress per protocol.    Letitia Libra, PT, DPT,  ATC 02/11/23 10:59 AM

## 2023-02-13 ENCOUNTER — Ambulatory Visit: Payer: BC Managed Care – PPO

## 2023-02-13 DIAGNOSIS — M6281 Muscle weakness (generalized): Secondary | ICD-10-CM

## 2023-02-13 DIAGNOSIS — M25551 Pain in right hip: Secondary | ICD-10-CM

## 2023-02-13 DIAGNOSIS — R2689 Other abnormalities of gait and mobility: Secondary | ICD-10-CM

## 2023-02-13 NOTE — Therapy (Signed)
OUTPATIENT PHYSICAL THERAPY LOWER EXTREMITY TREATMENT   Patient Name: Hailey [name] MRN: [id number] DOB:16-Jun-1960, 62 y.o., female Today's Date: 02/13/2023  END OF SESSION:  PT End of Session - 02/13/23 1448     Visit Number 7    Number of Visits 16    Date for PT Re-Evaluation 03/11/23    Authorization Type BCBS    PT Start Time 1447    PT Stop Time 1537    PT Time Calculation (min) 50 min    Activity Tolerance Patient tolerated treatment well    Behavior During Therapy [facility] for tasks assessed/performed                 Past Medical History:  Diagnosis Date   COVID 03/2020   Endometriosis    GERD (gastroesophageal reflux disease)    Hyperprolactinemia (HCC)    MRI 1992, 2001 AND 2003. LAST MRI NORMAL.   Psoriasis    Past Surgical History:  Procedure Laterality Date   BUNIONECTOMY     DILATION AND CURETTAGE OF UTERUS  2002   AT TIME OF HYSTEROSCOPY   FOOT SURGERY     spurs   GLUTEUS MINIMUS REPAIR Right 01/07/2023   Procedure: RIGHT HIP GLUTEUS MEDIUS REPAIR,POSSIBLE COLLAGEN PATCH AUGUMENTATION;  Surgeon: [name], MD;  Location: [facility];  Service: Orthopedics;  Laterality: Right;   HYSTEROSCOPY  2002   AT TIME OF DIAG LAP A HYSTEROSCOPY, D&C WAS DONE   IT band surg     MOUTH SURGERY     PELVIC LAPAROSCOPY  2002   DIAG LAP W LASER DONE WITH HYSTEROSCOPY,D&C   TOTAL ABDOMINAL HYSTERECTOMY  01/11/02   Patient Active Problem List   Diagnosis Date Noted   Tear of right gluteus medius tendon 01/07/2023   Unilateral primary osteoarthritis, left knee 08/04/2017   Unilateral primary osteoarthritis, right knee 08/04/2017   Chronic pain of left knee 11/04/2016   Chronic pain of right knee 11/04/2016   Trochanteric bursitis, right hip 07/17/2016   Right hand pain 04/14/2016   ACUTE PHARYNGITIS 12/23/2008   ALLERGIC RHINITIS 12/23/2008    PCP: Jarrett Soho, PA-C  REFERRING PROVIDER: [name]  REFERRING DIAG: R hip glut  repair  THERAPY DIAG:  Muscle weakness (generalized)  Other abnormalities of gait and mobility  Pain in right hip  Rationale for Evaluation and Treatment: Rehabilitation  ONSET DATE: Date of surgery 01/07/23  (2 WEEKS 12/3 AND 6 WEEKS IS 02/18/23)  SUBJECTIVE:   SUBJECTIVE STATEMENT: Patient reports the front of the hip is hurting. She has been getting decorations down for the past 3 hours.   PERTINENT HISTORY: GI issues, h/o kidney stones PAIN:  Are you having pain? Yes: NPRS scale: 1/10 Pain location: Rt anterior hip Pain description: burning;stinging  Aggravating factors: overactivity Relieving factors: gentle movement  PRECAUTIONS: Other: see protocol on file for restrictions   WEIGHT BEARING RESTRICTIONS: Yes Per Ardeen Fillers, LAT patient is WBAT   FALLS:  Has patient fallen in last 6 months? 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  PATIENT SURVEYS:  FOTO 29%, goal  to 58%  02/11/23: 55% function  SENSATION: WFL  -- notes mild numbness lateral hip on back side of bandage  EDEMA:  Erythema and bruising noted around surgical site  POSTURE:  WNLs  LOWER EXTREMITY ZOX:WRUE hip flexion t 90 per protocolPROM abduction to 15 degrees avoided IR, ER and adduction at this time.   SAQ and pelvic tilt  Passive ROM Right eval Left eval 01/20/23 Right  02/04/23 Right   Hip flexion To 90 degrees without pain  90 PROM 108 PROM   Hip extension      Hip abduction 15 degrees PROM     Hip adduction      Hip internal rotation      Hip external rotation       Knee flexion      Knee extension      Ankle dorsiflexion      Ankle plantarflexion      Ankle inversion      Ankle eversion       (Blank rows = not tested)  LOWER EXTREMITY MMT: not tested-- patient demonstrates good overall strength against gravity with L LE per functional activities  MMT Right eval Left eval  Hip flexion    Hip extension    Hip abduction    Hip adduction    Hip internal rotation    Hip external rotation    Knee flexion    Knee extension    Ankle dorsiflexion    Ankle plantarflexion    Ankle inversion    Ankle eversion     (Blank rows = not tested)   FUNCTIONAL TESTS:  1.33 ft/sec gait speed   GAIT: Distance walked: with rollator RW ---- PT adjusted height so patient could use for WB through [employer] to offload R LE to match 50% WB status. Assistive device utilized: Environmental consultant - 4 wheeled Level of assistance: Modified independence Comments: Stair negotiation reviewed and loaner RW from out clinic provided-- patient was going up/down her 2 steps from garage into home without rails using husband to lean on--- not maintaining WB precautions. [facility] Adult PT Treatment:                                                DATE: 02/13/23 Therapeutic Exercise: Standing trunk extension x 5  Manual Therapy: STM Rt hip flexor/quadriceps Scar tissue mobilization  Demonstrated use of tennis ball for self-soft tissue mobilization   Gait training: Working on increasing stride length, allowing for hip extension on the RLE, and improving pelvic stability with and without SPC Modalities: Ice pack to Rt hip x 10 minutes    [facility] Adult PT Treatment:                                                DATE: 02/11/23 Therapeutic Exercise: LAQ 2 x 10; 2#  Resisted HS curl red band 2 x 10  Standing calf raise 2 x 10  Manual Therapy: Passive Rt hip flexion, abduction, IR ROM to tolerance  STM Rt hip flexors    [facility] Adult PT Treatment:  DATE: 02/04/23 Therapeutic Exercise: Hooklying isometric hip abduction and flexion x 10 each ; 5 sec hold Hip bridge x 10  Updated HEP  Manual Therapy: Scar tissue massage STM gluteals and hip flexors Rt Passive hip flexion and abduction   Self Care: Desensitization techniques with handout provided.    [facility] Adult PT Treatment:                                                DATE: 01/29/23 Therapeutic Exercise: Quadruped rocking x 10  Glute sets in hooklying x 10  Reviewed and updated HEP  Manual Therapy: Passive Rt hip ROM within ROM precautions  Self Care: Sleep positioning recommendation Continue ice for pain control     PATIENT EDUCATION:  Education details: HEP review  Person educated: Patient Education method: Explanation Education comprehension: verbalized understanding  HOME EXERCISE PROGRAM: Access Code: [email or web address] URL: [email or web address] Date: 02/04/2023 Prepared by: Letitia Libra  Exercises - Supine Posterior Pelvic Tilt  - 2 x daily - 7 x weekly - 1 sets - 10 reps - Supine Short Arc Quad  - 2 x daily - 7 x weekly - 1 sets - 10 reps - Supine Gluteal Sets  - 2 x daily - 7 x weekly - 2 sets - 10 reps - 5 sec hold hold - Supine Hip Adduction Isometric with Ball  - 2 x daily - 7 x weekly - 2 sets - 10 reps - 5 sec  hold - Supine Isometric Hamstring Set  - 2 x daily - 7 x weekly - 2 sets - 10 reps - 5 sec hold hold - Quadruped Rocking Slow  - 2 x daily - 7 x weekly - 1 sets - 10 reps - Hooklying Isometric Hip Abduction with Belt  - 1 x daily - 7 x weekly - 2 sets - 10 reps - 5 sec  hold - Hooklying Isometric Hip Flexion  - 1 x daily - 7 x weekly - 2 sets - 10 reps - 5 sec  hold - Supine Bridge  - 1 x daily - 7 x weekly - 2 sets - 10 reps  Patient Education - Rubbing with Different Textures  ASSESSMENT:  CLINICAL IMPRESSION: Patient with mild pain about anterior hip. Notable tautness in hip flexor/quadriceps with partial release from manual  therapy. Worked on gait training with and without SPC focusing on improving hip extension. She is able to demonstrate proper gait pattern with cues, but without SPC she reported mild pain about lateral hip so was encouraged to continue to utilize [facility] at this time.   OBJECTIVE IMPAIRMENTS: Abnormal gait, decreased activity tolerance, decreased balance, difficulty walking, decreased ROM, decreased strength, impaired flexibility, and pain.   ACTIVITY LIMITATIONS: bending, sitting, squatting, and locomotion level  PARTICIPATION LIMITATIONS: meal prep, cleaning, and occupation  PERSONAL FACTORS: 1 comorbidity: h/o L IT band release  are also affecting patient's functional outcome.   REHAB POTENTIAL: Good  CLINICAL DECISION MAKING: Stable/uncomplicated  EVALUATION COMPLEXITY: Low   GOALS: Goals reviewed with patient? Yes  SHORT TERM GOALS: Target date: 02/09/23 The patient will be indep with HEP Baseline: initiated at evval Goal status: MET  2.  Improve FOTO to 45% Baseline:  29% Goal status: MET  3.  The patient will tolerate bike (raised seat due to hip flexor ROM limitations) x 10 min for activity toleance.  Baseline:  to perform Goal status: INITIAL  4.  Improve gait speed to > or equal to 1.8 ft/sec Baseline:  1.33 ft/sec Goal status: INITIAL  LONG TERM GOALS: Target date: 03/12/23  The patient will be indep with HEP progression Baseline:  initiated at eval Goal status: INITIAL  2.  The patient will improve FOTO to > or equal to 58% Baseline:  29% Goal status: INITIAL  3.  The patient will return to normalized gait without device. Baseline:  see above Goal status: INITIAL  4.  The patient will negotiate steps with recirpocal pattern  Baseline:  step to pattern 50% WB Goal status: INITIAL  5.  The patient will improve gait speed to > or equal to 2.8 ft/sec Baseline:  1.33 ft/sec Goal status: INITIAL   PLAN:  PT FREQUENCY: 1-2x/week  PT DURATION: 8  weeks  PLANNED INTERVENTIONS: 97110-Therapeutic exercises, 97530- Therapeutic activity, 97112- Neuromuscular re-education, 97535- Self Care, 16109- Manual therapy, 97116- Gait training, 97014- Electrical stimulation (unattended), Patient/Family education, Stair training, Taping, Dry Needling, Cryotherapy, and Moist heat  PLAN FOR NEXT SESSION: check HEP, progress per protocol.    Letitia Libra, PT, DPT, ATC 02/13/23 3:36 PM

## 2023-02-17 ENCOUNTER — Ambulatory Visit: Payer: BC Managed Care – PPO

## 2023-02-17 DIAGNOSIS — R2689 Other abnormalities of gait and mobility: Secondary | ICD-10-CM

## 2023-02-17 DIAGNOSIS — M6281 Muscle weakness (generalized): Secondary | ICD-10-CM | POA: Diagnosis not present

## 2023-02-17 DIAGNOSIS — M25551 Pain in right hip: Secondary | ICD-10-CM

## 2023-02-17 NOTE — Therapy (Signed)
OUTPATIENT PHYSICAL THERAPY LOWER EXTREMITY TREATMENT   Patient Name: Hailey [name] MRN: [id number] DOB:[birth year], 62 y.o., female Today's Date: 02/17/2023  END OF SESSION:  PT End of Session - 02/17/23 1531     Visit Number 8    Number of Visits 16    Date for PT Re-Evaluation 03/11/23    Authorization Type BCBS    PT Start Time 1530    PT Stop Time 1612    PT Time Calculation (min) 42 min    Activity Tolerance Patient tolerated treatment well    Behavior During Therapy [facility] for tasks assessed/performed                  Past Medical History:  Diagnosis Date   COVID 03/2020   Endometriosis    GERD (gastroesophageal reflux disease)    Hyperprolactinemia (HCC)    MRI 1992, 2001 AND 2003. LAST MRI NORMAL.   Psoriasis    Past Surgical History:  Procedure Laterality Date   BUNIONECTOMY     DILATION AND CURETTAGE OF UTERUS  2002   AT TIME OF HYSTEROSCOPY   FOOT SURGERY     spurs   GLUTEUS MINIMUS REPAIR Right 01/07/2023   Procedure: RIGHT HIP GLUTEUS MEDIUS REPAIR,POSSIBLE COLLAGEN PATCH AUGUMENTATION;  Surgeon: [name], MD;  Location: [facility];  Service: Orthopedics;  Laterality: Right;   HYSTEROSCOPY  2002   AT TIME OF DIAG LAP A HYSTEROSCOPY, D&C WAS DONE   IT band surg     MOUTH SURGERY     PELVIC LAPAROSCOPY  2002   DIAG LAP W LASER DONE WITH HYSTEROSCOPY,D&C   TOTAL ABDOMINAL HYSTERECTOMY  01/11/02   Patient Active Problem List   Diagnosis Date Noted   Tear of right gluteus medius tendon 01/07/2023   Unilateral primary osteoarthritis, left knee 08/04/2017   Unilateral primary osteoarthritis, right knee 08/04/2017   Chronic pain of left knee 11/04/2016   Chronic pain of right knee 11/04/2016   Trochanteric bursitis, right hip 07/17/2016   Right hand pain 04/14/2016   ACUTE PHARYNGITIS 12/23/2008   ALLERGIC RHINITIS 12/23/2008    PCP: Jarrett Soho, PA-C  REFERRING PROVIDER: [name]  REFERRING DIAG: R hip glut  repair  THERAPY DIAG:  Muscle weakness (generalized)  Other abnormalities of gait and mobility  Pain in right hip  Rationale for Evaluation and Treatment: Rehabilitation  ONSET DATE: Date of surgery 01/07/23  (2 WEEKS 12/3 AND 6 WEEKS IS 02/18/23)  SUBJECTIVE:   SUBJECTIVE STATEMENT: "The pain is more in the back of the hip now. "  PERTINENT HISTORY: GI issues, h/o kidney stones PAIN:  Are you having pain? Yes: NPRS scale: 1/10 Pain location: Rt posterior hip Pain description: tight/sore Aggravating factors: walking/standing Relieving factors: rest  PRECAUTIONS: Other: see protocol on file for restrictions   WEIGHT BEARING RESTRICTIONS: Yes Per Ardeen Fillers, LAT patient is WBAT   FALLS:  Has patient fallen in last 6 months? 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  PATIENT SURVEYS:  FOTO 29%, goal to 58%  02/11/23: 55% function  SENSATION: WFL  --  notes mild numbness lateral hip on back side of bandage  EDEMA:  Erythema and bruising noted around surgical site  POSTURE:  WNLs  LOWER EXTREMITY BJY:NWGN hip flexion t 90 per protocolPROM abduction to 15 degrees avoided IR, ER and adduction at this time.   SAQ and pelvic tilt  Passive ROM Right eval Left eval 01/20/23 Right  02/04/23 Right   Hip flexion To 90 degrees without pain  90 PROM 108 PROM   Hip extension      Hip abduction 15 degrees PROM     Hip adduction      Hip internal rotation      Hip external rotation      Knee flexion      Knee extension      Ankle dorsiflexion      Ankle  plantarflexion      Ankle inversion      Ankle eversion       (Blank rows = not tested)  LOWER EXTREMITY MMT: not tested-- patient demonstrates good overall strength against gravity with L LE per functional activities  MMT Right eval Left eval  Hip flexion    Hip extension    Hip abduction    Hip adduction    Hip internal rotation    Hip external rotation    Knee flexion    Knee extension    Ankle dorsiflexion    Ankle plantarflexion    Ankle inversion    Ankle eversion     (Blank rows = not tested)   FUNCTIONAL TESTS:  1.33 ft/sec gait speed   GAIT: Distance walked: with rollator RW ---- PT adjusted height so patient could use for WB through [employer] to offload R LE to match 50% WB status. Assistive device utilized: Environmental consultant - 4 wheeled Level of assistance: Modified independence Comments: Stair negotiation reviewed and loaner RW from out clinic provided-- patient was going up/down her 2 steps from garage into home without rails using husband to lean on--- not maintaining WB precautions.  [facility] Adult PT Treatment:                                                DATE: 02/17/23 Therapeutic Exercise: Hip bridge 2 x 10  SKTC with strap x 10; 5 sec hold  Supine TA march 2 x 10  Supine hip flexor stretch x 1 min TKE green band 2 x 10  Walking in clinic 2 laps with [facility]  Manual Therapy: Passive Rt hip flexion, abduction, IR ROM to tolerance  STM Rt gluteals   [facility] Adult PT Treatment:                                                DATE: 02/13/23 Therapeutic Exercise: Standing trunk extension x 5  Manual Therapy: STM Rt hip flexor/quadriceps Scar tissue mobilization  Demonstrated use of tennis ball for self-soft tissue mobilization   Gait training: Working on increasing stride length, allowing for hip extension on the RLE, and improving pelvic stability with and without SPC Modalities: Ice pack to Rt hip x 10 minutes    PATIENT EDUCATION:  Education details: HEP review   Person educated: Patient Education method: Explanation Education comprehension: verbalized understanding  HOME EXERCISE  PROGRAM: Access Code: [email or web address] URL: [email or web address] Date: 02/04/2023 Prepared by: Letitia Libra  Exercises - Supine Posterior Pelvic Tilt  - 2 x daily - 7 x weekly - 1 sets - 10 reps - Supine Short Arc Quad  - 2 x daily - 7 x weekly - 1 sets - 10 reps - Supine Gluteal Sets  - 2 x daily - 7 x weekly - 2 sets - 10 reps - 5 sec hold hold - Supine Hip Adduction Isometric with Ball  - 2 x daily - 7 x weekly - 2 sets - 10 reps - 5 sec  hold - Supine Isometric Hamstring Set  - 2 x daily - 7 x weekly - 2 sets - 10 reps - 5 sec hold hold - Quadruped Rocking Slow  - 2 x daily - 7 x weekly - 1 sets - 10 reps - Hooklying Isometric Hip Abduction with Belt  - 1 x daily - 7 x weekly - 2 sets - 10 reps - 5 sec  hold - Hooklying Isometric Hip Flexion  - 1 x daily - 7 x weekly - 2 sets - 10 reps - 5 sec  hold - Supine Bridge  - 1 x daily - 7 x weekly - 2 sets - 10 reps  Patient Education - Rubbing with Different Textures  ASSESSMENT:  CLINICAL IMPRESSION: Patient with mild posterior hip soreness upon arrival. Progressed hip strengthening and ROM with good tolerance. She reported initial "twinge" about superior aspect of scar with single knee to chest that improved with further reps, otherwise no complaints with protocol progression. Gait speed has improved having met this STG.   OBJECTIVE IMPAIRMENTS: Abnormal gait, decreased activity tolerance, decreased balance, difficulty walking, decreased ROM, decreased strength, impaired flexibility, and pain.   ACTIVITY LIMITATIONS: bending, sitting, squatting, and locomotion level  PARTICIPATION LIMITATIONS: meal prep, cleaning, and occupation  PERSONAL FACTORS: 1 comorbidity: h/o L IT band release  are also affecting patient's functional outcome.   REHAB POTENTIAL: Good  CLINICAL DECISION MAKING:  Stable/uncomplicated  EVALUATION COMPLEXITY: Low   GOALS: Goals reviewed with patient? Yes  SHORT TERM GOALS: Target date: 02/09/23 The patient will be indep with HEP Baseline: initiated at evval Goal status: MET  2.  Improve FOTO to 45% Baseline:  29% Goal status: MET  3.  The patient will tolerate bike (raised seat due to hip flexor ROM limitations) x 10 min for activity toleance.  Baseline:  to perform Goal status: progressing as appropriate   4.  Improve gait speed to > or equal to 1.8 ft/sec Baseline:  1.33 ft/sec 02/17/23: 2.3 ft/sec  Goal status: MET  LONG TERM GOALS: Target date: 03/12/23  The patient will be indep with HEP progression Baseline:  initiated at eval Goal status: INITIAL  2.  The patient will improve FOTO to > or equal to 58% Baseline:  29% Goal status: INITIAL  3.  The patient will return to normalized gait without device. Baseline:  see above Goal status: INITIAL  4.  The patient will negotiate steps with recirpocal pattern  Baseline:  step to pattern 50% WB Goal status: INITIAL  5.  The patient will improve gait speed to > or equal to 2.8 ft/sec Baseline:  1.33 ft/sec Goal status: INITIAL   PLAN:  PT FREQUENCY: 1-2x/week  PT DURATION: 8 weeks  PLANNED INTERVENTIONS: 97110-Therapeutic exercises, 97530- Therapeutic activity, 97112- Neuromuscular re-education, 97535- Self Care, [id number]- Manual therapy, [phone]- Gait training, [phone]- Electrical stimulation (unattended), Patient/Family education, Stair training, Taping,  Dry Needling, Cryotherapy, and Moist heat  PLAN FOR NEXT SESSION: check HEP, progress per protocol.    Letitia Libra, PT, DPT, ATC 02/17/23 4:23 PM

## 2023-02-21 ENCOUNTER — Ambulatory Visit: Payer: 59 | Attending: Orthopaedic Surgery

## 2023-02-21 DIAGNOSIS — R2689 Other abnormalities of gait and mobility: Secondary | ICD-10-CM | POA: Diagnosis present

## 2023-02-21 DIAGNOSIS — M25551 Pain in right hip: Secondary | ICD-10-CM | POA: Insufficient documentation

## 2023-02-21 DIAGNOSIS — M6281 Muscle weakness (generalized): Secondary | ICD-10-CM | POA: Insufficient documentation

## 2023-02-21 NOTE — Therapy (Signed)
 OUTPATIENT PHYSICAL THERAPY LOWER EXTREMITY TREATMENT   Patient Name: Hailey [name] MRN: [id number] DOB:[birth year], 63 y.o., female Today's Date: 02/21/2023  END OF SESSION:  PT End of Session - 02/21/23 0845     Visit Number 9    Number of Visits 16    Date for PT Re-Evaluation 03/11/23    Authorization Type BCBS    PT Start Time 0845    PT Stop Time 0925    PT Time Calculation (min) 40 min    Activity Tolerance Patient tolerated treatment well    Behavior During Therapy [facility] for tasks assessed/performed                   Past Medical History:  Diagnosis Date   COVID 03/2020   Endometriosis    GERD (gastroesophageal reflux disease)    Hyperprolactinemia (HCC)    MRI 1992, 2001 AND 2003. LAST MRI NORMAL.   Psoriasis    Past Surgical History:  Procedure Laterality Date   BUNIONECTOMY     DILATION AND CURETTAGE OF UTERUS  2002   AT TIME OF HYSTEROSCOPY   FOOT SURGERY     spurs   GLUTEUS MINIMUS REPAIR Right 01/07/2023   Procedure: RIGHT HIP GLUTEUS MEDIUS REPAIR,POSSIBLE COLLAGEN PATCH AUGUMENTATION;  Surgeon: Genelle Standing, MD;  Location: [facility];  Service: Orthopedics;  Laterality: Right;   HYSTEROSCOPY  2002   AT TIME OF DIAG LAP A HYSTEROSCOPY, D&C WAS DONE   IT band surg     MOUTH SURGERY     PELVIC LAPAROSCOPY  2002   DIAG LAP W LASER DONE WITH HYSTEROSCOPY,D&C   TOTAL ABDOMINAL HYSTERECTOMY  01/11/02   Patient Active Problem List   Diagnosis Date Noted   Tear of right gluteus medius tendon 01/07/2023   Unilateral primary osteoarthritis, left knee 08/04/2017   Unilateral primary osteoarthritis, right knee 08/04/2017   Chronic pain of left knee 11/04/2016   Chronic pain of right knee 11/04/2016   Trochanteric bursitis, right hip 07/17/2016   Right hand pain 04/14/2016   ACUTE PHARYNGITIS 12/23/2008   ALLERGIC RHINITIS 12/23/2008    PCP: Katina Pfeiffer, PA-C  REFERRING PROVIDER: Madie  REFERRING DIAG: R hip glut  repair  THERAPY DIAG:  Muscle weakness (generalized)  Other abnormalities of gait and mobility  Pain in right hip  Rationale for Evaluation and Treatment: Rehabilitation  ONSET DATE: Date of surgery 01/07/23  (2 WEEKS 12/3 AND 6 WEEKS IS 02/18/23)  SUBJECTIVE:   SUBJECTIVE STATEMENT: It feels good today.   PERTINENT HISTORY: GI issues, h/o kidney stones PAIN:  Are you having pain? Yes: NPRS scale: none currently; 1 at worst/10 Pain location: Rt posterior hip Pain description: tight/sore Aggravating factors: walking/standing Relieving factors: rest  PRECAUTIONS: Other: see protocol on file for restrictions   WEIGHT BEARING RESTRICTIONS: Yes Per Conley Dawson, LAT patient is WBAT   FALLS:  Has patient fallen in last 6 months? No  LIVING ENVIRONMENT: Lives with: lives with their family Stairs:  2-4 depending on door to enter  Has following equipment at home: Environmental Consultant - 4 wheeled  OCCUPATION: retired runner, broadcasting/film/video, works for [employer] and [employer]  PLOF: Independent  PATIENT GOALS: be able to sleep without pain, be able to ride in a car without pain  NEXT MD VISIT: 01/22/23  OBJECTIVE:  Note: Objective measures were completed at Evaluation unless otherwise noted.  PATIENT SURVEYS:  FOTO 29%, goal to 58%  02/11/23: 55% function  SENSATION: WFL  -- notes  mild numbness lateral hip on back side of bandage  EDEMA:  Erythema and bruising noted around surgical site  POSTURE:  WNLs  LOWER EXTREMITY MNF:EMNF hip flexion t 90 per protocolPROM abduction to 15 degrees avoided IR, ER and adduction at this time.   SAQ and pelvic tilt  Passive ROM Right eval Left eval 01/20/23 Right  02/04/23 Right  02/21/23 Right AROM  Hip flexion To 90 degrees without pain  90 PROM 108 PROM  111  Hip extension       Hip abduction 15 degrees PROM    30  Hip adduction       Hip internal rotation       Hip external rotation       Knee flexion       Knee extension        Ankle dorsiflexion       Ankle plantarflexion       Ankle inversion       Ankle eversion        (Blank rows = not tested)  LOWER EXTREMITY MMT: not tested-- patient demonstrates good overall strength against gravity with L LE per functional activities  MMT Right eval Left eval  Hip flexion    Hip extension    Hip abduction    Hip adduction    Hip internal rotation    Hip external rotation    Knee flexion    Knee extension    Ankle dorsiflexion    Ankle plantarflexion    Ankle inversion    Ankle eversion     (Blank rows = not tested)   FUNCTIONAL TESTS:  1.33 ft/sec gait speed   GAIT: Distance walked: with rollator RW ---- PT adjusted height so patient could use for WB through [employer] to offload R LE to match 50% WB status. Assistive device utilized: Environmental Consultant - 4 wheeled Level of assistance: Modified independence Comments: Stair negotiation reviewed and loaner RW from out clinic provided-- patient was going up/down her 2 steps from garage into home without rails using husband to lean on--- not maintaining WB precautions. [facility] Adult PT Treatment:                                                DATE: 02/21/23 Therapeutic Exercise: Supine hip flexor stretch 1 minute  Mini squat with BUE support 2 x 10  SLS with BUE support 2 x 10 sec DL calf raise 2 x 10  Hip rotation on stool 2 x 10  Supine TA march 2 x 10  Hip bridge with adduction isometrics 2 x 10  Supine hip abduction AROM 1 x 10  Walking 1 lap in clinic  Updated HEP  Manual Therapy: PROM Rt hip flexion, abduction, IR, ER to tolerance   [facility] Adult PT Treatment:                                                DATE: 02/17/23 Therapeutic Exercise: Hip bridge 2 x 10  SKTC with strap x 10; 5 sec hold  Supine TA march 2 x 10  Supine hip flexor stretch x 1 min TKE green band 2 x 10  Walking in clinic 2 laps with [facility]  Manual Therapy:  Passive Rt hip flexion, abduction, IR ROM to tolerance  STM Rt gluteals   [facility] Adult  PT Treatment:                                                DATE: 02/13/23 Therapeutic Exercise: Standing trunk extension x 5  Manual Therapy: STM Rt hip flexor/quadriceps Scar tissue mobilization  Demonstrated use of tennis ball for self-soft tissue mobilization   Gait training: Working on increasing stride length, allowing for hip extension on the RLE, and improving pelvic stability with and without SPC Modalities: Ice pack to Rt hip x 10 minutes    PATIENT EDUCATION:  Education details: HEP update   Person educated: Patient Education method: Explanation, demo, cues, handout Education comprehension: verbalized understanding, returned demo, cues   HOME EXERCISE PROGRAM: Access Code: [email or web address] URL: [email or web address] Date: 02/21/2023 Prepared by: Lucie Meeter  Exercises - Quadruped Rocking Slow  - 1 x daily - 7 x weekly - 1 sets - 10 reps - Hooklying Single Knee to Chest Stretch  - 1 x daily - 7 x weekly - 1 sets - 10 reps - 5 sec  hold - Supine Hip Abduction  - 1 x daily - 7 x weekly - 1 sets - 10 reps - Mini Squat with Counter Support  - 1 x daily - 7 x weekly - 2 sets - 10 reps - Heel Raises with Counter Support  - 1 x daily - 7 x weekly - 2 sets - 10 reps - Supine March with Posterior Pelvic Tilt  - 1 x daily - 7 x weekly - 2 sets - 10 reps - Supine Bridge with Mini Swiss Ball Between Knees  - 1 x daily - 7 x weekly - 2 sets - 10 reps  Patient Education - Rubbing with Different Textures  ASSESSMENT:  CLINICAL IMPRESSION: Progressed per post-operative protocol as patient is >6 weeks post-op, introducing CKC strengthening and progression of hip AROM with good tolerance. She is able to maintain pelvic stability during SLS on the RLE with BUE support without onset of pain, but trendelenburg noted when walking without AD. Hip flexion and abduction AROM measurements obtained today with mild limitation noted in each plane.   OBJECTIVE IMPAIRMENTS: Abnormal  gait, decreased activity tolerance, decreased balance, difficulty walking, decreased ROM, decreased strength, impaired flexibility, and pain.   ACTIVITY LIMITATIONS: bending, sitting, squatting, and locomotion level  PARTICIPATION LIMITATIONS: meal prep, cleaning, and occupation  PERSONAL FACTORS: 1 comorbidity: h/o L IT band release  are also affecting patient's functional outcome.   REHAB POTENTIAL: Good  CLINICAL DECISION MAKING: Stable/uncomplicated  EVALUATION COMPLEXITY: Low   GOALS: Goals reviewed with patient? Yes  SHORT TERM GOALS: Target date: 02/09/23 The patient will be indep with HEP Baseline: initiated at evval Goal status: MET  2.  Improve FOTO to 45% Baseline:  29% Goal status: MET  3.  The patient will tolerate bike (raised seat due to hip flexor ROM limitations) x 10 min for activity toleance.  Baseline:  to perform Goal status: progressing as appropriate   4.  Improve gait speed to > or equal to 1.8 ft/sec Baseline:  1.33 ft/sec 02/17/23: 2.3 ft/sec  Goal status: MET  LONG TERM GOALS: Target date: 03/12/23  The patient will be indep with HEP progression Baseline:  initiated at eval Goal status: INITIAL  2.  The patient will improve FOTO to > or equal to 58% Baseline:  29% Goal status: INITIAL  3.  The patient will return to normalized gait without device. Baseline:  see above Goal status: INITIAL  4.  The patient will negotiate steps with recirpocal pattern  Baseline:  step to pattern 50% WB Goal status: INITIAL  5.  The patient will improve gait speed to > or equal to 2.8 ft/sec Baseline:  1.33 ft/sec Goal status: INITIAL   PLAN:  PT FREQUENCY: 1-2x/week  PT DURATION: 8 weeks  PLANNED INTERVENTIONS: 97110-Therapeutic exercises, 97530- Therapeutic activity, 97112- Neuromuscular re-education, 97535- Self Care, [id number]- Manual therapy, 97116- Gait training, 97014- Electrical stimulation (unattended), Patient/Family education, Stair  training, Taping, Dry Needling, Cryotherapy, and Moist heat  PLAN FOR NEXT SESSION: check HEP, progress per protocol.    Zaydan Papesh, PT, DPT, ATC 02/21/23 9:27 AM

## 2023-02-24 ENCOUNTER — Ambulatory Visit: Payer: Self-pay

## 2023-02-24 DIAGNOSIS — M6281 Muscle weakness (generalized): Secondary | ICD-10-CM

## 2023-02-24 DIAGNOSIS — R2689 Other abnormalities of gait and mobility: Secondary | ICD-10-CM

## 2023-02-24 DIAGNOSIS — M25551 Pain in right hip: Secondary | ICD-10-CM

## 2023-02-24 NOTE — Therapy (Signed)
 OUTPATIENT PHYSICAL THERAPY LOWER EXTREMITY TREATMENT   Patient Name: Hailey [name] MRN: [id number] DOB:[birth year], 63 y.o., female Today's Date: 02/24/2023  END OF SESSION:  PT End of Session - 02/24/23 1516     Visit Number 10    Number of Visits 16    Date for PT Re-Evaluation 03/11/23    Authorization Type BCBS    PT Start Time 1518    PT Stop Time 1607    PT Time Calculation (min) 49 min    Activity Tolerance Patient tolerated treatment well    Behavior During Therapy [facility] for tasks assessed/performed                    Past Medical History:  Diagnosis Date   COVID 03/2020   Endometriosis    GERD (gastroesophageal reflux disease)    Hyperprolactinemia (HCC)    MRI 1992, 2001 AND 2003. LAST MRI NORMAL.   Psoriasis    Past Surgical History:  Procedure Laterality Date   BUNIONECTOMY     DILATION AND CURETTAGE OF UTERUS  2002   AT TIME OF HYSTEROSCOPY   FOOT SURGERY     spurs   GLUTEUS MINIMUS REPAIR Right 01/07/2023   Procedure: RIGHT HIP GLUTEUS MEDIUS REPAIR,POSSIBLE COLLAGEN PATCH AUGUMENTATION;  Surgeon: Genelle Standing, MD;  Location: [facility];  Service: Orthopedics;  Laterality: Right;   HYSTEROSCOPY  2002   AT TIME OF DIAG LAP A HYSTEROSCOPY, D&C WAS DONE   IT band surg     MOUTH SURGERY     PELVIC LAPAROSCOPY  2002   DIAG LAP W LASER DONE WITH HYSTEROSCOPY,D&C   TOTAL ABDOMINAL HYSTERECTOMY  01/11/02   Patient Active Problem List   Diagnosis Date Noted   Tear of right gluteus medius tendon 01/07/2023   Unilateral primary osteoarthritis, left knee 08/04/2017   Unilateral primary osteoarthritis, right knee 08/04/2017   Chronic pain of left knee 11/04/2016   Chronic pain of right knee 11/04/2016   Trochanteric bursitis, right hip 07/17/2016   Right hand pain 04/14/2016   ACUTE PHARYNGITIS 12/23/2008   ALLERGIC RHINITIS 12/23/2008    PCP: Katina Pfeiffer, PA-C  REFERRING PROVIDER: Madie  REFERRING DIAG: R hip glut  repair  THERAPY DIAG:  Muscle weakness (generalized)  Other abnormalities of gait and mobility  Pain in right hip  Rationale for Evaluation and Treatment: Rehabilitation  ONSET DATE: Date of surgery 01/07/23  (2 WEEKS 12/3 AND 6 WEEKS IS 02/18/23)  SUBJECTIVE:   SUBJECTIVE STATEMENT: Patient reports the hip feels good today. No pain currently. Exercises are going well reporting abduction AROM is the most challenging.   PERTINENT HISTORY: GI issues, h/o kidney stones PAIN:  Are you having pain? Yes: NPRS scale: none currently; 1 at worst/10 Pain location: Rt posterior hip Pain description: tight/sore Aggravating factors: walking/standing Relieving factors: rest  PRECAUTIONS: Other: see protocol on file for restrictions   WEIGHT BEARING RESTRICTIONS: Yes Per Conley Dawson, LAT patient is WBAT   FALLS:  Has patient fallen in last 6 months? No  LIVING ENVIRONMENT: Lives with: lives with their family Stairs:  2-4 depending on door to enter  Has following equipment at home: Environmental Consultant - 4 wheeled  OCCUPATION: retired runner, broadcasting/film/video, works for [employer]  PLOF: Independent  PATIENT GOALS: be able to sleep without pain, be able to ride in a car without pain  NEXT MD VISIT: 01/22/23  OBJECTIVE:  Note: Objective measures were completed at Evaluation unless otherwise noted.  PATIENT SURVEYS:  FOTO 29%, goal to 58%  02/11/23: 55% function  SENSATION: WFL  -- notes mild numbness lateral hip on back side of bandage  EDEMA:  Erythema and bruising noted around surgical site  POSTURE:  WNLs  LOWER EXTREMITY MNF:EMNF hip flexion t 90 per protocolPROM abduction to 15 degrees avoided IR, ER and adduction at this time.   SAQ and pelvic tilt  Passive ROM Right eval Left eval 01/20/23 Right  02/04/23 Right  02/21/23 Right AROM  Hip flexion To 90 degrees without pain  90 PROM 108 PROM  111  Hip extension       Hip abduction 15 degrees PROM    30  Hip  adduction       Hip internal rotation       Hip external rotation       Knee flexion       Knee extension       Ankle dorsiflexion       Ankle plantarflexion       Ankle inversion       Ankle eversion        (Blank rows = not tested)  LOWER EXTREMITY MMT: not tested-- patient demonstrates good overall strength against gravity with L LE per functional activities  MMT Right eval Left eval  Hip flexion    Hip extension    Hip abduction    Hip adduction    Hip internal rotation    Hip external rotation    Knee flexion    Knee extension    Ankle dorsiflexion    Ankle plantarflexion    Ankle inversion    Ankle eversion     (Blank rows = not tested)   FUNCTIONAL TESTS:  1.33 ft/sec gait speed   GAIT: Distance walked: with rollator RW ---- PT adjusted height so patient could use for WB through [employer] to offload R LE to match 50% WB status. Assistive device utilized: Environmental Consultant - 4 wheeled Level of assistance: Modified independence Comments: Stair negotiation reviewed and loaner RW from out clinic provided-- patient was going up/down her 2 steps from garage into home without rails using husband to lean on--- not maintaining WB precautions. [facility] Adult PT Treatment:                                                DATE: 02/24/23 Therapeutic Exercise: Recumbent bike no resistance x 5 minutes  Hooklying hip abduction 2 x 10  Leg press 2 x 10;  45 lbs  Lateral step up 4 inch 2 x 10  Stool IR/ER x 10  Reviewed HEP  Manual Therapy: Rt hip PROM flexion, abduction, IR, ER to tolerance Scar tissue mobilization with demonstration on how patient can perform independently   Modalities:  Ice pack to Rt hip x 10 minutes   [facility] Adult PT Treatment:                                                DATE: 02/21/23 Therapeutic Exercise: Supine hip flexor stretch 1 minute  Mini squat with BUE support 2 x 10  SLS with BUE support 2 x 10 sec DL calf raise 2 x 10  Hip rotation on stool 2  x 10  Supine TA  march 2 x 10  Hip bridge with adduction isometrics 2 x 10  Supine hip abduction AROM 1 x 10  Walking 1 lap in clinic  Updated HEP  Manual Therapy: PROM Rt hip flexion, abduction, IR, ER to tolerance   [facility] Adult PT Treatment:                                                DATE: 02/17/23 Therapeutic Exercise: Hip bridge 2 x 10  SKTC with strap x 10; 5 sec hold  Supine TA march 2 x 10  Supine hip flexor stretch x 1 min TKE green band 2 x 10  Walking in clinic 2 laps with SPC  Manual Therapy: Passive Rt hip flexion, abduction, IR ROM to tolerance  STM Rt gluteals    PATIENT EDUCATION:  Education details: HEP review Person educated: Patient Education method: Programmer, Multimedia,  Education comprehension: verbalized understanding,   HOME EXERCISE PROGRAM: Access Code: [email or web address] URL: [email or web address] Date: 02/21/2023 Prepared by: Lucie Meeter  Exercises - Quadruped Rocking Slow  - 1 x daily - 7 x weekly - 1 sets - 10 reps - Hooklying Single Knee to Chest Stretch  - 1 x daily - 7 x weekly - 1 sets - 10 reps - 5 sec  hold - Supine Hip Abduction  - 1 x daily - 7 x weekly - 1 sets - 10 reps - Mini Squat with Counter Support  - 1 x daily - 7 x weekly - 2 sets - 10 reps - Heel Raises with Counter Support  - 1 x daily - 7 x weekly - 2 sets - 10 reps - Supine March with Posterior Pelvic Tilt  - 1 x daily - 7 x weekly - 2 sets - 10 reps - Supine Bridge with Mini Swiss Ball Between Knees  - 1 x daily - 7 x weekly - 2 sets - 10 reps  Patient Education - Rubbing with Different Textures  ASSESSMENT:  CLINICAL IMPRESSION: Patient tolerated session well today with further progression of hip strengthening. Good tolerance to closed chain strength progression with introduction to leg press and lateral step ups today without onset of pain. Educated patient on how to perform scar tissue mobilization as she has moderate tautness about superior aspect of scar.   OBJECTIVE  IMPAIRMENTS: Abnormal gait, decreased activity tolerance, decreased balance, difficulty walking, decreased ROM, decreased strength, impaired flexibility, and pain.   ACTIVITY LIMITATIONS: bending, sitting, squatting, and locomotion level  PARTICIPATION LIMITATIONS: meal prep, cleaning, and occupation  PERSONAL FACTORS: 1 comorbidity: h/o L IT band release  are also affecting patient's functional outcome.   REHAB POTENTIAL: Good  CLINICAL DECISION MAKING: Stable/uncomplicated  EVALUATION COMPLEXITY: Low   GOALS: Goals reviewed with patient? Yes  SHORT TERM GOALS: Target date: 02/09/23 The patient will be indep with HEP Baseline: initiated at evval Goal status: MET  2.  Improve FOTO to 45% Baseline:  29% Goal status: MET  3.  The patient will tolerate bike (raised seat due to hip flexor ROM limitations) x 10 min for activity toleance.  Baseline:  to perform Goal status: progressing as appropriate   4.  Improve gait speed to > or equal to 1.8 ft/sec Baseline:  1.33 ft/sec 02/17/23: 2.3 ft/sec  Goal status: MET  LONG TERM GOALS: Target date: 03/12/23  The patient  will be indep with HEP progression Baseline:  initiated at eval Goal status: INITIAL  2.  The patient will improve FOTO to > or equal to 58% Baseline:  29% Goal status: INITIAL  3.  The patient will return to normalized gait without device. Baseline:  see above Goal status: INITIAL  4.  The patient will negotiate steps with recirpocal pattern  Baseline:  step to pattern 50% WB Goal status: INITIAL  5.  The patient will improve gait speed to > or equal to 2.8 ft/sec Baseline:  1.33 ft/sec Goal status: INITIAL   PLAN:  PT FREQUENCY: 1-2x/week  PT DURATION: 8 weeks  PLANNED INTERVENTIONS: 97110-Therapeutic exercises, 97530- Therapeutic activity, 97112- Neuromuscular re-education, 97535- Self Care, 02859- Manual therapy, 97116- Gait training, 97014- Electrical stimulation (unattended), Patient/Family  education, Stair training, Taping, Dry Needling, Cryotherapy, and Moist heat  PLAN FOR NEXT SESSION: check HEP, progress per protocol.    Eryn Krejci, PT, DPT, ATC 02/24/23 4:10 PM

## 2023-02-26 ENCOUNTER — Encounter (HOSPITAL_BASED_OUTPATIENT_CLINIC_OR_DEPARTMENT_OTHER): Payer: Self-pay | Admitting: Orthopaedic Surgery

## 2023-02-26 ENCOUNTER — Ambulatory Visit (HOSPITAL_BASED_OUTPATIENT_CLINIC_OR_DEPARTMENT_OTHER): Payer: 59 | Admitting: Orthopaedic Surgery

## 2023-02-26 DIAGNOSIS — S76011A Strain of muscle, fascia and tendon of right hip, initial encounter: Secondary | ICD-10-CM

## 2023-02-26 NOTE — Progress Notes (Signed)
 Post Operative Evaluation    Procedure/Date of Surgery: Right hip gluteus medius repair  11/19  Interval History:    Presents 6 weeks status post right hip gluteus medius repair overall doing very well.  She is continuing to progress well and her gait cycle is much improved.   PMH/PSH/Family History/Social History/Meds/Allergies:    Past Medical History:  Diagnosis Date   COVID 03/2020   Endometriosis    GERD (gastroesophageal reflux disease)    Hyperprolactinemia (HCC)    MRI 1992, 2001 AND 2003. LAST MRI NORMAL.   Psoriasis    Past Surgical History:  Procedure Laterality Date   BUNIONECTOMY     DILATION AND CURETTAGE OF UTERUS  2002   AT TIME OF HYSTEROSCOPY   FOOT SURGERY     spurs   GLUTEUS MINIMUS REPAIR Right 01/07/2023   Procedure: RIGHT HIP GLUTEUS MEDIUS REPAIR,POSSIBLE COLLAGEN PATCH AUGUMENTATION;  Surgeon: Genelle Standing, MD;  Location: [facility];  Service: Orthopedics;  Laterality: Right;   HYSTEROSCOPY  2002   AT TIME OF DIAG LAP A HYSTEROSCOPY, D&C WAS DONE   IT band surg     MOUTH SURGERY     PELVIC LAPAROSCOPY  2002   DIAG LAP W LASER DONE WITH HYSTEROSCOPY,D&C   TOTAL ABDOMINAL HYSTERECTOMY  01/11/02   Social History   Socioeconomic History   Marital status: Married    Spouse name: Not on file   Number of children: Not on file   Years of education: Not on file   Highest education level: Not on file  Occupational History   Not on file  Tobacco Use   Smoking status: Never   Smokeless tobacco: Never  Vaping Use   Vaping status: Never Used  Substance and Sexual Activity   Alcohol use: Yes    Comment: 0-2 month   Drug use: No   Sexual activity: Yes    Partners: Male    Birth control/protection: Surgical    Comment: HYST-1st intercourse 8 yo-5 partners  Other Topics Concern   Not on file  Social History Narrative   Not on file   Social Drivers of Health   Financial Resource Strain:  Low Risk  (09/04/2022)   Received [facility]   Overall Financial Resource Strain (CARDIA)    Difficulty of Paying Living Expenses: Not hard at all  Food Insecurity: No Food Insecurity (09/04/2022)   Received from [facility]   Hunger Vital Sign    Worried About Running Out of Food in the Last Year: Never true    Ran Out of Food in the Last Year: Never true  Transportation Needs: No Transportation Needs (09/04/2022)   Received from [facility] - Transportation    Lack of Transportation (Medical): No    Lack of Transportation (Non-Medical): No  Physical Activity: Insufficiently Active (09/04/2022)   Received from [facility]   Exercise Vital Sign    Days of Exercise per Week: 3 days    Minutes of Exercise per Session: 30 min  Stress: No Stress Concern Present (09/04/2022)   Received from [facility] - Occupational Stress Questionnaire    Feeling of Stress : Not at all  Social Connections: Socially Integrated (09/04/2022)   [facility]   Social  Network    How would you rate your social network (family, work, friends)?: Good participation with social networks   Family History  Problem Relation Age of Onset   Breast cancer Mother 56   ALS Mother    Cancer Father        PAROTID GLAND   Hypertension Father    Breast cancer Maternal Aunt 71   Heart failure Maternal Aunt    Cancer Maternal Aunt        Lymphoma   Heart disease Maternal Grandmother    Heart disease Paternal Grandmother    Heart failure Maternal Uncle    Allergies  Allergen Reactions   Codeine     REACTION: Itching   Current Outpatient Medications  Medication Sig Dispense Refill   albuterol (VENTOLIN HFA) 108 (90 Base) MCG/ACT inhaler      ALPRAZolam  (XANAX ) 0.25 MG tablet TAKE 1 TABLET BY MOUTH EVERY 8 HOURS AS NEEDED FOR ANXIETY 30 tablet 0   APPLE CIDER VINEGAR PO Take by mouth.     Apremilast (OTEZLA) 30 MG TABS 1 tablet      Ascorbic Acid (VITAMIN C PO) Take by mouth.       aspirin  EC 325 MG tablet Take 1 tablet (325 mg total) by mouth daily. 14 tablet 0   Azelastine HCl 137 MCG/SPRAY SOLN      B Complex Vitamins (VITAMIN-B COMPLEX PO) Take by mouth.       Calcium Carbonate-Vitamin D  (CALCIUM + D PO) Take by mouth.       cetirizine (ZYRTEC) 10 MG tablet Take 10 mg by mouth daily.     Cholecalciferol (VITAMIN D3 PO) Take by mouth.     CINNAMON PO Take by mouth.     clobetasol cream (TEMOVATE) 0.05 % APP ON THE SKIN BID PRN  0   Cranberry 500 MG TABS Take by mouth.     cycloSPORINE (RESTASIS) 0.05 % ophthalmic emulsion 1 drop 2 (two) times daily.     diclofenac  Sodium (VOLTAREN ) 1 % GEL Apply 4 g topically 4 (four) times daily. (Patient not taking: Reported on 01/10/2023) 350 g 1   dicyclomine (BENTYL) 10 MG capsule Take by mouth. 60mg  daily     fluticasone (FLONASE) 50 MCG/ACT nasal spray      ipratropium (ATROVENT) 0.06 % nasal spray      MAGNESIUM PO Take by mouth.     Multiple Vitamins-Iron (MULTIVITAMIN/IRON PO) Take by mouth.       Multiple Vitamins-Minerals (ZINC PO) Take by mouth.     nystatin -triamcinolone  ointment (MYCOLOG) Apply 1 application topically 2 (two) times daily. 30 g 0   Omega-3 Fatty Acids (FISH OIL PO) Take by mouth.       oxyCODONE  (ROXICODONE ) 5 MG immediate release tablet Take 1 tablet (5 mg total) by mouth every 4 (four) hours as needed for severe pain (pain score 7-10) or breakthrough pain. 10 tablet 0   POTASSIUM PO Take by mouth.       Probiotic Product (PROBIOTIC-10 PO) Take by mouth.     Red Yeast Rice Extract (RED YEAST RICE PO) Take by mouth.     Turmeric 400 MG CAPS Take by mouth.     UNABLE TO FIND Liver care capsules Stress care caps [facility] - hot flushes shatavari - supplement     vitamin B-12 (CYANOCOBALAMIN) 100 MCG tablet      VITAMIN E PO Take by mouth.       No current facility-administered medications for this visit.  No results found.  Review of Systems:    A ROS was performed including pertinent positives and negatives as documented in the HPI.   Musculoskeletal Exam:     Right hip incision is well-appearing without erythema or drainage.  Range of motion is 30 degrees internal/external rotation.  Abduction not tested today.  Walks with mild antalgic gait.  Distal neurosensory exam is intact.  Imaging:      I personally reviewed and interpreted the radiographs.   Assessment:   6 weeks status post right hip gluteus medius repair overall doing extremely well.  She will continue to progress active range of motion and strengthening at this point.  I will plan to see her back in 6 weeks for reassessment  Plan :    -Return to clinic 6 weeks for reassessment      I personally saw and evaluated the patient, and participated in the management and treatment plan.  Elspeth Parker, MD Attending Physician, Orthopedic Surgery  This document was dictated using Dragon voice recognition software. A reasonable attempt at proof reading has been made to minimize errors.

## 2023-03-03 ENCOUNTER — Ambulatory Visit: Payer: 59

## 2023-03-03 DIAGNOSIS — M25551 Pain in right hip: Secondary | ICD-10-CM

## 2023-03-03 DIAGNOSIS — M6281 Muscle weakness (generalized): Secondary | ICD-10-CM | POA: Diagnosis not present

## 2023-03-03 DIAGNOSIS — R2689 Other abnormalities of gait and mobility: Secondary | ICD-10-CM

## 2023-03-03 NOTE — Therapy (Signed)
 OUTPATIENT PHYSICAL THERAPY LOWER EXTREMITY TREATMENT   Patient Name: Hailey [name] MRN: [id number] DOB:05-31-1960, 63 y.o., female Today's Date: 03/03/2023  END OF SESSION:  PT End of Session - 03/03/23 1533     Visit Number 11    Number of Visits 16    Date for PT Re-Evaluation 03/11/23    Authorization Type BCBS    PT Start Time 1533    PT Stop Time 1615    PT Time Calculation (min) 42 min    Activity Tolerance Patient tolerated treatment well    Behavior During Therapy [facility] for tasks assessed/performed                     Past Medical History:  Diagnosis Date   COVID 03/2020   Endometriosis    GERD (gastroesophageal reflux disease)    Hyperprolactinemia (HCC)    MRI 1992, 2001 AND 2003. LAST MRI NORMAL.   Psoriasis    Past Surgical History:  Procedure Laterality Date   BUNIONECTOMY     DILATION AND CURETTAGE OF UTERUS  2002   AT TIME OF HYSTEROSCOPY   FOOT SURGERY     spurs   GLUTEUS MINIMUS REPAIR Right 01/07/2023   Procedure: RIGHT HIP GLUTEUS MEDIUS REPAIR,POSSIBLE COLLAGEN PATCH AUGUMENTATION;  Surgeon: Genelle Standing, MD;  Location: [facility];  Service: Orthopedics;  Laterality: Right;   HYSTEROSCOPY  2002   AT TIME OF DIAG LAP A HYSTEROSCOPY, D&C WAS DONE   IT band surg     MOUTH SURGERY     PELVIC LAPAROSCOPY  2002   DIAG LAP W LASER DONE WITH HYSTEROSCOPY,D&C   TOTAL ABDOMINAL HYSTERECTOMY  01/11/02   Patient Active Problem List   Diagnosis Date Noted   Tear of right gluteus medius tendon 01/07/2023   Unilateral primary osteoarthritis, left knee 08/04/2017   Unilateral primary osteoarthritis, right knee 08/04/2017   Chronic pain of left knee 11/04/2016   Chronic pain of right knee 11/04/2016   Trochanteric bursitis, right hip 07/17/2016   Right hand pain 04/14/2016   ACUTE PHARYNGITIS 12/23/2008   ALLERGIC RHINITIS 12/23/2008    PCP: Katina Pfeiffer, PA-C  REFERRING PROVIDER: Madie  REFERRING DIAG: R hip glut  repair  THERAPY DIAG:  Muscle weakness (generalized)  Other abnormalities of gait and mobility  Pain in right hip  Rationale for Evaluation and Treatment: Rehabilitation  ONSET DATE: Date of surgery 01/07/23  (2 WEEKS 12/3 AND 6 WEEKS IS 02/18/23)  SUBJECTIVE:   SUBJECTIVE STATEMENT: Patient reports the hip feels stiff after sitting today. She had f/u with surgeon who is pleased with her progress.   PERTINENT HISTORY: GI issues, h/o kidney stones PAIN:  Are you having pain? Yes: NPRS scale: none currently; 1 at worst/10 Pain location: Rt posterior hip Pain description: tight/sore Aggravating factors: walking/standing Relieving factors: rest  PRECAUTIONS: Other: see protocol on file for restrictions   WEIGHT BEARING RESTRICTIONS: Yes Per Conley Dawson, LAT patient is WBAT   FALLS:  Has patient fallen in last 6 months? No  LIVING ENVIRONMENT: Lives with: lives with their family Stairs:  2-4 depending on door to enter  Has following equipment at home: Environmental Consultant - 4 wheeled  OCCUPATION: retired runner, broadcasting/film/video, works for [employer]  PLOF: Independent  PATIENT GOALS: be able to sleep without pain, be able to ride in a car without pain  NEXT MD VISIT: 01/22/23  OBJECTIVE:  Note: Objective measures were completed at Evaluation unless otherwise noted.  PATIENT SURVEYS:  FOTO 29%, goal to 58%  02/11/23: 55% function  SENSATION: WFL  -- notes mild numbness lateral hip on back side of bandage  EDEMA:  Erythema and bruising noted around surgical site  POSTURE:  WNLs  LOWER EXTREMITY MNF:EMNF hip flexion t 90 per protocolPROM abduction to 15 degrees avoided IR, ER and adduction at this time.   SAQ and pelvic tilt  Passive ROM Right eval Left eval 01/20/23 Right  02/04/23 Right  02/21/23 Right AROM  Hip flexion To 90 degrees without pain  90 PROM 108 PROM  111  Hip extension       Hip abduction 15 degrees PROM    30  Hip adduction       Hip  internal rotation       Hip external rotation       Knee flexion       Knee extension       Ankle dorsiflexion       Ankle plantarflexion       Ankle inversion       Ankle eversion        (Blank rows = not tested)  LOWER EXTREMITY MMT: not tested-- patient demonstrates good overall strength against gravity with L LE per functional activities  MMT Right eval Left eval  Hip flexion    Hip extension    Hip abduction    Hip adduction    Hip internal rotation    Hip external rotation    Knee flexion    Knee extension    Ankle dorsiflexion    Ankle plantarflexion    Ankle inversion    Ankle eversion     (Blank rows = not tested)   FUNCTIONAL TESTS:  1.33 ft/sec gait speed   GAIT: Distance walked: with rollator RW ---- PT adjusted height so patient could use for WB through [employer] to offload R LE to match 50% WB status. Assistive device utilized: Environmental Consultant - 4 wheeled Level of assistance: Modified independence Comments: Stair negotiation reviewed and loaner RW from out clinic provided-- patient was going up/down her 2 steps from garage into home without rails using husband to lean on--- not maintaining WB precautions.  [facility] Adult PT Treatment:                                                DATE: 03/03/23 Therapeutic Exercise: Pelvic tilts on physioball 2 x 10  Pelvic circles on physioball 2 x 10 CW/CCW  Standing hip abduction 2 x 10  Standing hip extension 2 x 10  Standing march 2 x 10  Mini squats 2 x 10  LAQ 2 x 10 @ 1.5 lbs  HS curl green band 2 x 10  Updated HEP    [facility] Adult PT Treatment:                                                DATE: 02/24/23 Therapeutic Exercise: Recumbent bike no resistance x 5 minutes  Hooklying hip abduction 2 x 10  Leg press 2 x 10;  45 lbs  Lateral step up 4 inch 2 x 10  Stool IR/ER x 10  Reviewed HEP  Manual Therapy: Rt hip PROM flexion, abduction,  IR, ER to tolerance Scar tissue mobilization with demonstration on how patient can  perform independently   Modalities:  Ice pack to Rt hip x 10 minutes   [facility] Adult PT Treatment:                                                DATE: 02/21/23 Therapeutic Exercise: Supine hip flexor stretch 1 minute  Mini squat with BUE support 2 x 10  SLS with BUE support 2 x 10 sec DL calf raise 2 x 10  Hip rotation on stool 2 x 10  Supine TA march 2 x 10  Hip bridge with adduction isometrics 2 x 10  Supine hip abduction AROM 1 x 10  Walking 1 lap in clinic  Updated HEP  Manual Therapy: PROM Rt hip flexion, abduction, IR, ER to tolerance   [facility] Adult PT Treatment:                                                DATE: 02/17/23 Therapeutic Exercise: Hip bridge 2 x 10  SKTC with strap x 10; 5 sec hold  Supine TA march 2 x 10  Supine hip flexor stretch x 1 min TKE green band 2 x 10  Walking in clinic 2 laps with SPC  Manual Therapy: Passive Rt hip flexion, abduction, IR ROM to tolerance  STM Rt gluteals    PATIENT EDUCATION:  Education details: HEP update Person educated: Patient Education method: Explanation, demo, cues, handout Education comprehension: verbalized understanding, returned demo, cues   HOME EXERCISE PROGRAM: Access Code: [email or web address] URL: [email or web address] Date: 03/03/2023 Prepared by: Lucie Meeter  Exercises - Quadruped Rocking Slow  - 1 x daily - 7 x weekly - 1 sets - 10 reps - Hooklying Single Knee to Chest Stretch  - 1 x daily - 7 x weekly - 1 sets - 10 reps - 5 sec  hold - Heel Raises with Counter Support  - 1 x daily - 7 x weekly - 2 sets - 10 reps - Mini Squat with Counter Support  - 1 x daily - 3 x weekly - 2 sets - 10 reps - Supine March with Posterior Pelvic Tilt  - 1 x daily - 3 x weekly - 2 sets - 10 reps - Supine Bridge with Mini Swiss Ball Between Knees  - 1 x daily - 3 x weekly - 2 sets - 10 reps - Standing Hip Abduction with Counter Support  - 1 x daily - 3 x weekly - 2 sets - 10 reps - Standing Hip Extension with Counter  Support  - 1 x daily - 3 x weekly - 2 sets - 10 reps - Standing Marching  - 1 x daily - 3 x weekly - 2 sets - 10 reps  ASSESSMENT:  CLINICAL IMPRESSION: Patient is progressing well at nearly 8 weeks post-op. Progressed standing hip strengthening today without onset of pain. Requires occasional cues to correct trunk lean during standing activity with ability to correct once cued. With squats she is noted to have excessive anterior tibial translation with ability to occasionally correct once cued.   OBJECTIVE IMPAIRMENTS: Abnormal gait, decreased activity tolerance, decreased balance, difficulty walking, decreased ROM, decreased strength, impaired  flexibility, and pain.   ACTIVITY LIMITATIONS: bending, sitting, squatting, and locomotion level  PARTICIPATION LIMITATIONS: meal prep, cleaning, and occupation  PERSONAL FACTORS: 1 comorbidity: h/o L IT band release  are also affecting patient's functional outcome.   REHAB POTENTIAL: Good  CLINICAL DECISION MAKING: Stable/uncomplicated  EVALUATION COMPLEXITY: Low   GOALS: Goals reviewed with patient? Yes  SHORT TERM GOALS: Target date: 02/09/23 The patient will be indep with HEP Baseline: initiated at evval Goal status: MET  2.  Improve FOTO to 45% Baseline:  29% Goal status: MET  3.  The patient will tolerate bike (raised seat due to hip flexor ROM limitations) x 10 min for activity toleance.  Baseline:  to perform Goal status: progressing as appropriate   4.  Improve gait speed to > or equal to 1.8 ft/sec Baseline:  1.33 ft/sec 02/17/23: 2.3 ft/sec  Goal status: MET  LONG TERM GOALS: Target date: 03/12/23  The patient will be indep with HEP progression Baseline:  initiated at eval Goal status: INITIAL  2.  The patient will improve FOTO to > or equal to 58% Baseline:  29% Goal status: INITIAL  3.  The patient will return to normalized gait without device. Baseline:  see above Goal status: INITIAL  4.  The patient  will negotiate steps with recirpocal pattern  Baseline:  step to pattern 50% WB Goal status: INITIAL  5.  The patient will improve gait speed to > or equal to 2.8 ft/sec Baseline:  1.33 ft/sec Goal status: INITIAL   PLAN:  PT FREQUENCY: 1-2x/week  PT DURATION: 8 weeks  PLANNED INTERVENTIONS: 97110-Therapeutic exercises, 97530- Therapeutic activity, 97112- Neuromuscular re-education, 97535- Self Care, [id number]- Manual therapy, 97116- Gait training, 97014- Electrical stimulation (unattended), Patient/Family education, Stair training, Taping, Dry Needling, Cryotherapy, and Moist heat  PLAN FOR NEXT SESSION: progress per protocol    Lucie Meeter, PT, DPT, ATC 03/03/23 4:15 PM

## 2023-03-11 ENCOUNTER — Ambulatory Visit: Payer: 59

## 2023-03-11 DIAGNOSIS — M6281 Muscle weakness (generalized): Secondary | ICD-10-CM

## 2023-03-11 DIAGNOSIS — R2689 Other abnormalities of gait and mobility: Secondary | ICD-10-CM

## 2023-03-11 DIAGNOSIS — M25551 Pain in right hip: Secondary | ICD-10-CM

## 2023-03-11 NOTE — Therapy (Signed)
OUTPATIENT PHYSICAL THERAPY LOWER EXTREMITY TREATMENT RE-CERTIFICATION   Patient Name: Hailey [name] MRN: [id number] DOB:08-01-1960, 63 y.o., female Today's Date: 03/11/2023  END OF SESSION:  PT End of Session - 03/11/23 1623     Visit Number 12    Number of Visits 18    Date for PT Re-Evaluation 04/26/23    Authorization Type BCBS    PT Start Time 1622    PT Stop Time 1701    PT Time Calculation (min) 39 min    Activity Tolerance Patient tolerated treatment well    Behavior During Therapy [facility] for tasks assessed/performed                      Past Medical History:  Diagnosis Date   COVID 03/2020   Endometriosis    GERD (gastroesophageal reflux disease)    Hyperprolactinemia (HCC)    MRI 1992, 2001 AND 2003. LAST MRI NORMAL.   Psoriasis    Past Surgical History:  Procedure Laterality Date   BUNIONECTOMY     DILATION AND CURETTAGE OF UTERUS  2002   AT TIME OF HYSTEROSCOPY   FOOT SURGERY     spurs   GLUTEUS MINIMUS REPAIR Right 01/07/2023   Procedure: RIGHT HIP GLUTEUS MEDIUS REPAIR,POSSIBLE COLLAGEN PATCH AUGUMENTATION;  Surgeon: [name], MD;  Location: [facility];  Service: Orthopedics;  Laterality: Right;   HYSTEROSCOPY  2002   AT TIME OF DIAG LAP A HYSTEROSCOPY, D&C WAS DONE   IT band surg     MOUTH SURGERY     PELVIC LAPAROSCOPY  2002   DIAG LAP W LASER DONE WITH HYSTEROSCOPY,D&C   TOTAL ABDOMINAL HYSTERECTOMY  01/11/02   Patient Active Problem List   Diagnosis Date Noted   Tear of right gluteus medius tendon 01/07/2023   Unilateral primary osteoarthritis, left knee 08/04/2017   Unilateral primary osteoarthritis, right knee 08/04/2017   Chronic pain of left knee 11/04/2016   Chronic pain of right knee 11/04/2016   Trochanteric bursitis, right hip 07/17/2016   Right hand pain 04/14/2016   ACUTE PHARYNGITIS 12/23/2008   ALLERGIC RHINITIS 12/23/2008    PCP: Jarrett Soho, PA-C  REFERRING PROVIDER:  [name]  REFERRING DIAG: R hip glut repair  THERAPY DIAG:  Muscle weakness (generalized)  Other abnormalities of gait and mobility  Pain in right hip  Rationale for Evaluation and Treatment: Rehabilitation  ONSET DATE: Date of surgery 01/07/23  (2 WEEKS 12/3 AND 6 WEEKS IS 02/18/23)  SUBJECTIVE:   SUBJECTIVE STATEMENT: "I'm feeling pretty good."   PERTINENT HISTORY: GI issues, h/o kidney stones PAIN:  Are you having pain? No   PRECAUTIONS: Other: see protocol on file for restrictions   WEIGHT BEARING RESTRICTIONS: Yes Per Ardeen Fillers, LAT patient is WBAT   FALLS:  Has patient fallen in last 6 months? 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  PATIENT SURVEYS:  FOTO 29%, goal to 58% 02/11/23: 55% function  03/11/23: 67% function  SENSATION: WFL  -- notes mild numbness lateral hip on back side of bandage  EDEMA:  Erythema  and bruising noted around surgical site  POSTURE:  WNLs  LOWER EXTREMITY LKG:MWNU hip flexion t 90 per protocolPROM abduction to 15 degrees avoided IR, ER and adduction at this time.   SAQ and pelvic tilt  Passive ROM Right eval Left eval 01/20/23 Right  02/04/23 Right  02/21/23 Right AROM 03/11/23 Right AROM  Hip flexion To 90 degrees without pain  90 PROM 108 PROM  111 120  Hip extension        Hip abduction 15 degrees PROM    30 35  Hip adduction        Hip internal rotation        Hip external rotation        Knee flexion        Knee extension        Ankle dorsiflexion        Ankle plantarflexion        Ankle inversion         Ankle eversion         (Blank rows = not tested)  LOWER EXTREMITY MMT: not tested-- patient demonstrates good overall strength against gravity with L LE per functional activities  MMT Right 03/11/23 Left 03/11/23  Hip flexion 4+ 4+   Hip extension 4-   Hip abduction 3+   Hip adduction    Hip internal rotation    Hip external rotation    Knee flexion    Knee extension    Ankle dorsiflexion    Ankle plantarflexion    Ankle inversion    Ankle eversion     (Blank rows = not tested)   FUNCTIONAL TESTS:  1.33 ft/sec gait speed   GAIT: Distance walked: with rollator RW ---- PT adjusted height so patient could use for WB through [employer] to offload R LE to match 50% WB status. Assistive device utilized: Environmental consultant - 4 wheeled Level of assistance: Modified independence Comments: Stair negotiation reviewed and loaner RW from out clinic provided-- patient was going up/down her 2 steps from garage into home without rails using husband to lean on--- not maintaining WB precautions. [facility] Adult PT Treatment:                                                DATE: 03/11/23 Therapeutic Exercise: SLS RLE 2 x 30 sec with single UE support  Step ups 6 inch 2 x 10  Lateral step ups 6 inch 2 x 10  Updated HEP   Therapeutic Activity: Re-assessment to determine overall progress, educating patient on progress towards goals.    [facility] Adult PT Treatment:                                                DATE: 03/03/23 Therapeutic Exercise: Pelvic tilts on physioball 2 x 10  Pelvic circles on physioball 2 x 10 CW/CCW  Standing hip abduction 2 x 10  Standing hip extension 2 x 10  Standing march 2 x 10  Mini squats 2 x 10  LAQ 2 x 10 @ 1.5 lbs  HS curl green band 2 x 10  Updated HEP    [facility] Adult PT Treatment:  DATE: 02/24/23 Therapeutic Exercise: Recumbent bike no resistance x 5 minutes  Hooklying hip abduction 2 x 10  Leg press 2 x 10;  45 lbs  Lateral  step up 4 inch 2 x 10  Stool IR/ER x 10  Reviewed HEP  Manual Therapy: Rt hip PROM flexion, abduction, IR, ER to tolerance Scar tissue mobilization with demonstration on how patient can perform independently   Modalities:  Ice pack to Rt hip x 10 minutes   [facility] Adult PT Treatment:                                                DATE: 02/21/23 Therapeutic Exercise: Supine hip flexor stretch 1 minute  Mini squat with BUE support 2 x 10  SLS with BUE support 2 x 10 sec DL calf raise 2 x 10  Hip rotation on stool 2 x 10  Supine TA march 2 x 10  Hip bridge with adduction isometrics 2 x 10  Supine hip abduction AROM 1 x 10  Walking 1 lap in clinic  Updated HEP  Manual Therapy: PROM Rt hip flexion, abduction, IR, ER to tolerance  PATIENT EDUCATION:  Education details: HEP update Person educated: Patient Education method: Explanation, demo, cues, handout Education comprehension: verbalized understanding, returned demo, cues   HOME EXERCISE PROGRAM: Access Code: [email or web address] URL: [email or web address] Date: 03/11/2023 Prepared by: Letitia Libra  Exercises - Quadruped Rocking Slow  - 1 x daily - 7 x weekly - 1 sets - 10 reps - Hooklying Single Knee to Chest Stretch  - 1 x daily - 7 x weekly - 1 sets - 10 reps - 5 sec  hold - Mini Squat with Counter Support  - 1 x daily - 3 x weekly - 2 sets - 10 reps - Supine March with Posterior Pelvic Tilt  - 1 x daily - 3 x weekly - 2 sets - 10 reps - Supine Bridge with Mini Swiss Ball Between Knees  - 1 x daily - 3 x weekly - 2 sets - 10 reps - Standing Hip Abduction with Counter Support  - 1 x daily - 3 x weekly - 2 sets - 10 reps - Standing Hip Extension with Counter Support  - 1 x daily - 3 x weekly - 2 sets - 10 reps - Standing Marching  - 1 x daily - 3 x weekly - 2 sets - 10 reps - Single Leg Stance with Support  - 1 x daily - 3 x weekly - 3 sets - 30 sec hold - Lateral Step Up  - 1 x daily - 3 x weekly - 3 sets - 10 reps - Step  Up  - 1 x daily - 3 x weekly - 3 sets - 10 reps  ASSESSMENT:  CLINICAL IMPRESSION: Stacie is progressing as expected s/p Rt glute medius repair on 01/07/23. She demonstrates normalized hip ROM, but continues to have gluteal weakness that is expected at this time given her protocol progression. She will benefit from continued skilled PT to address lingering strength deficits in order to optimize her function.   OBJECTIVE IMPAIRMENTS: Abnormal gait, decreased activity tolerance, decreased balance, difficulty walking, decreased ROM, decreased strength, impaired flexibility, and pain.   ACTIVITY LIMITATIONS: bending, sitting, squatting, and locomotion level  PARTICIPATION LIMITATIONS: meal prep, cleaning, and occupation  PERSONAL FACTORS: 1 comorbidity: h/o L IT  band release  are also affecting patient's functional outcome.   REHAB POTENTIAL: Good  CLINICAL DECISION MAKING: Stable/uncomplicated  EVALUATION COMPLEXITY: Low   GOALS: Goals reviewed with patient? Yes  SHORT TERM GOALS: Target date: 02/09/23 The patient will be indep with HEP Baseline: initiated at evval Goal status: MET  2.  Improve FOTO to 45% Baseline:  29% Goal status: MET  3.  The patient will tolerate bike (raised seat due to hip flexor ROM limitations) x 10 min for activity toleance.  Baseline:  to perform 03/11/23: bike x 10 minutes  Goal status: MET  4.  Improve gait speed to > or equal to 1.8 ft/sec Baseline:  1.33 ft/sec 02/17/23: 2.3 ft/sec  Goal status: MET  LONG TERM GOALS: Target date: 04/26/23  The patient will be indep with HEP progression Baseline:  initiated at eval Goal status: progressing   2.  The patient will improve FOTO to > or equal to 58% Baseline:  29% Goal status: MET  3.  The patient will return to normalized gait without device. Baseline:  see above 03/11/23: mild trendelenburg  Goal status: progressing   4.  The patient will negotiate steps with recirpocal pattern   Baseline:  step to pattern 50% WB 03/11/23: step to descent; reciprocal ascent  Goal status: progressing   5.  The patient will improve gait speed to > or equal to 2.8 ft/sec Baseline:  1.33 ft/sec 03/11/23: 2.9 ft/sec Goal status: MET   PLAN:  PT FREQUENCY: 1x/week  PT DURATION: 6 weeks  PLANNED INTERVENTIONS: 97110-Therapeutic exercises, 97530- Therapeutic activity, 97112- Neuromuscular re-education, 97535- Self Care, 01027- Manual therapy, 97116- Gait training, 97014- Electrical stimulation (unattended), Patient/Family education, Stair training, Taping, Dry Needling, Cryotherapy, and Moist heat  PLAN FOR NEXT SESSION: progress per protocol    Letitia Libra, PT, DPT, ATC 03/11/23 5:01 PM

## 2023-03-17 ENCOUNTER — Encounter: Payer: Self-pay | Admitting: Physical Therapy

## 2023-03-20 ENCOUNTER — Ambulatory Visit: Payer: 59

## 2023-03-20 DIAGNOSIS — M6281 Muscle weakness (generalized): Secondary | ICD-10-CM | POA: Diagnosis not present

## 2023-03-20 DIAGNOSIS — R2689 Other abnormalities of gait and mobility: Secondary | ICD-10-CM

## 2023-03-20 DIAGNOSIS — M25551 Pain in right hip: Secondary | ICD-10-CM

## 2023-03-20 NOTE — Therapy (Signed)
OUTPATIENT PHYSICAL THERAPY LOWER EXTREMITY TREATMENT    Patient Name: Hailey [name] MRN: [id number] DOB:08-15-1960, 63 y.o., female Today's Date: 03/20/2023  END OF SESSION:  PT End of Session - 03/20/23 1618     Visit Number 13    Number of Visits 18    Date for PT Re-Evaluation 04/26/23    Authorization Type BCBS    PT Start Time 1617    PT Stop Time 1658    PT Time Calculation (min) 41 min    Activity Tolerance Patient tolerated treatment well    Behavior During Therapy [facility] for tasks assessed/performed                       Past Medical History:  Diagnosis Date   COVID 03/2020   Endometriosis    GERD (gastroesophageal reflux disease)    Hyperprolactinemia (HCC)    MRI 1992, 2001 AND 2003. LAST MRI NORMAL.   Psoriasis    Past Surgical History:  Procedure Laterality Date   BUNIONECTOMY     DILATION AND CURETTAGE OF UTERUS  2002   AT TIME OF HYSTEROSCOPY   FOOT SURGERY     spurs   GLUTEUS MINIMUS REPAIR Right 01/07/2023   Procedure: RIGHT HIP GLUTEUS MEDIUS REPAIR,POSSIBLE COLLAGEN PATCH AUGUMENTATION;  Surgeon: [name], MD;  Location: [facility];  Service: Orthopedics;  Laterality: Right;   HYSTEROSCOPY  2002   AT TIME OF DIAG LAP A HYSTEROSCOPY, D&C WAS DONE   IT band surg     MOUTH SURGERY     PELVIC LAPAROSCOPY  2002   DIAG LAP W LASER DONE WITH HYSTEROSCOPY,D&C   TOTAL ABDOMINAL HYSTERECTOMY  01/11/02   Patient Active Problem List   Diagnosis Date Noted   Tear of right gluteus medius tendon 01/07/2023   Unilateral primary osteoarthritis, left knee 08/04/2017   Unilateral primary osteoarthritis, right knee 08/04/2017   Chronic pain of left knee 11/04/2016   Chronic pain of right knee 11/04/2016   Trochanteric bursitis, right hip 07/17/2016   Right hand pain 04/14/2016   ACUTE PHARYNGITIS 12/23/2008   ALLERGIC RHINITIS 12/23/2008    PCP: Jarrett Soho, PA-C  REFERRING PROVIDER: [name]  REFERRING DIAG: R  hip glut repair  THERAPY DIAG:  Muscle weakness (generalized)  Other abnormalities of gait and mobility  Pain in right hip  Rationale for Evaluation and Treatment: Rehabilitation  ONSET DATE: Date of surgery 01/07/23  (2 WEEKS 12/3 AND 6 WEEKS IS 02/18/23)  SUBJECTIVE:   SUBJECTIVE STATEMENT: "I've had two good weeks."   PERTINENT HISTORY: GI issues, h/o kidney stones PAIN:  Are you having pain? No   PRECAUTIONS: Other: see protocol on file for restrictions   WEIGHT BEARING RESTRICTIONS: Yes Per Ardeen Fillers, LAT patient is WBAT   FALLS:  Has patient fallen in last 6 months? 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  PATIENT SURVEYS:  FOTO 29%, goal to 58% 02/11/23: 55% function  03/11/23: 67% function  SENSATION: WFL  -- notes mild numbness lateral hip on back side of bandage  EDEMA:  Erythema and bruising noted around surgical site  POSTURE:  WNLs  LOWER EXTREMITY ZOX:WRUE hip flexion t 90 per protocolPROM abduction to 15 degrees avoided IR, ER and adduction at this time.   SAQ and pelvic tilt  Passive ROM Right eval Left eval 01/20/23 Right  02/04/23 Right  02/21/23 Right AROM 03/11/23 Right AROM  Hip flexion To 90 degrees without pain  90 PROM 108 PROM  111 120  Hip extension        Hip abduction 15 degrees PROM    30 35  Hip adduction        Hip internal rotation        Hip external rotation        Knee flexion        Knee extension        Ankle dorsiflexion        Ankle plantarflexion        Ankle inversion        Ankle eversion          (Blank rows = not tested)  LOWER EXTREMITY MMT: not tested-- patient demonstrates good overall strength against gravity with L LE per functional activities  MMT Right 03/11/23 Left 03/11/23  Hip flexion 4+ 4+   Hip extension 4-   Hip abduction 3+   Hip adduction    Hip internal rotation    Hip external rotation    Knee flexion    Knee extension    Ankle dorsiflexion    Ankle plantarflexion    Ankle inversion    Ankle eversion     (Blank rows = not tested)   FUNCTIONAL TESTS:  1.33 ft/sec gait speed   GAIT: Distance walked: with rollator RW ---- PT adjusted height so patient could use for WB through [employer] to offload R LE to match 50% WB status. Assistive device utilized: Environmental consultant - 4 wheeled Level of assistance: Modified independence Comments: Stair negotiation reviewed and loaner RW from out clinic provided-- patient was going up/down her 2 steps from garage into home without rails using husband to lean on--- not maintaining WB precautions.  [facility] Adult PT Treatment:                                                DATE: 03/20/23 Therapeutic Exercise: Elliptical level 1 x 3 minutes  Figure 4 stretch 2 x 30 sec  IT band stretch 2 x 30 sec  Supine hip flexor stretch 2 x 30 sec  Lateral step down 2 x 10; 4 inch step  SL leg press 2 x 10 @ 40 lbs  Lateral band walks 2 sets d/b x 15 ft  Bodyweight squats x 10  Updated HEP    [facility] Adult PT Treatment:                                                DATE: 03/11/23 Therapeutic Exercise: SLS RLE 2 x 30 sec with single UE support  Step ups 6 inch 2 x 10  Lateral step ups 6 inch 2 x 10  Updated HEP   Therapeutic Activity: Re-assessment to determine overall progress, educating patient on progress towards goals.    [facility] Adult PT Treatment:  DATE: 03/03/23 Therapeutic Exercise: Pelvic tilts on physioball 2 x 10  Pelvic circles on physioball 2 x 10 CW/CCW  Standing hip abduction 2 x 10   Standing hip extension 2 x 10  Standing march 2 x 10  Mini squats 2 x 10  LAQ 2 x 10 @ 1.5 lbs  HS curl green band 2 x 10  Updated HEP    PATIENT EDUCATION:  Education details: HEP update Person educated: Patient Education method: Explanation, demo, cues, handout Education comprehension: verbalized understanding, returned demo, cues   HOME EXERCISE PROGRAM: Access Code: [email or web address] URL: [email or web address] Date: 03/20/2023 Prepared by: Letitia Libra  Exercises - Supine Quadriceps Stretch with Strap on Table  - 1 x daily - 7 x weekly - 3 sets - 30 sec  hold - Supine Figure 4 Piriformis Stretch  - 1 x daily - 7 x weekly - 3 sets - 30  hold - Quadruped Rocking Slow  - 1 x daily - 7 x weekly - 1 sets - 10 reps - Hooklying Single Knee to Chest Stretch  - 1 x daily - 7 x weekly - 1 sets - 10 reps - 5 sec  hold - Supine March with Posterior Pelvic Tilt  - 1 x daily - 3 x weekly - 2 sets - 10 reps - Supine Bridge with Mini Swiss Ball Between Knees  - 1 x daily - 3 x weekly - 2 sets - 10 reps - Standing Hip Abduction with Counter Support  - 1 x daily - 3 x weekly - 2 sets - 10 reps - Standing Hip Extension with Counter Support  - 1 x daily - 3 x weekly - 2 sets - 10 reps - Standing Marching  - 1 x daily - 3 x weekly - 2 sets - 10 reps - Single Leg Stance with Support  - 1 x daily - 3 x weekly - 3 sets - 30 sec hold - Lateral Step Up  - 1 x daily - 3 x weekly - 3 sets - 10 reps - Step Up  - 1 x daily - 3 x weekly - 3 sets - 10 reps - Side Stepping with Resistance at Ankles  - 1 x daily - 3 x weekly - 3 sets - 10 reps - Squat  - 1 x daily - 3 x weekly - 2 sets - 10 reps  ASSESSMENT:  CLINICAL IMPRESSION: Patient tolerated session well today with further hip strengthening and stretching progression per post-operative protocol. Introduced elliptical today with good tolerance, though quickly fatigues. Challenged with lateral step downs having mild difficulty controlling  descent. With targeted glute strengthening she reported initial hip discomfort that resolved with further repetitions.   OBJECTIVE IMPAIRMENTS: Abnormal gait, decreased activity tolerance, decreased balance, difficulty walking, decreased ROM, decreased strength, impaired flexibility, and pain.   ACTIVITY LIMITATIONS: bending, sitting, squatting, and locomotion level  PARTICIPATION LIMITATIONS: meal prep, cleaning, and occupation  PERSONAL FACTORS: 1 comorbidity: h/o L IT band release  are also affecting patient's functional outcome.   REHAB POTENTIAL: Good  CLINICAL DECISION MAKING: Stable/uncomplicated  EVALUATION COMPLEXITY: Low   GOALS: Goals reviewed with patient? Yes  SHORT TERM GOALS: Target date: 02/09/23 The patient will be indep with HEP Baseline: initiated at evval Goal status: MET  2.  Improve FOTO to 45% Baseline:  29% Goal status: MET  3.  The patient will tolerate bike (raised seat due to hip flexor ROM limitations) x 10 min for activity toleance.  Baseline:  to perform 03/11/23: bike x 10 minutes  Goal status: MET  4.  Improve gait speed to > or equal to 1.8 ft/sec Baseline:  1.33 ft/sec 02/17/23: 2.3 ft/sec  Goal status: MET  LONG TERM GOALS: Target date: 04/26/23  The patient will be indep with HEP progression Baseline:  initiated at eval Goal status: progressing   2.  The patient will improve FOTO to > or equal to 58% Baseline:  29% Goal status: MET  3.  The patient will return to normalized gait without device. Baseline:  see above 03/11/23: mild trendelenburg  Goal status: progressing   4.  The patient will negotiate steps with recirpocal pattern  Baseline:  step to pattern 50% WB 03/11/23: step to descent; reciprocal ascent  Goal status: progressing   5.  The patient will improve gait speed to > or equal to 2.8 ft/sec Baseline:  1.33 ft/sec 03/11/23: 2.9 ft/sec Goal status: MET   PLAN:  PT FREQUENCY: 1x/week  PT DURATION: 6  weeks  PLANNED INTERVENTIONS: 97110-Therapeutic exercises, 97530- Therapeutic activity, 97112- Neuromuscular re-education, 97535- Self Care, 09811- Manual therapy, 97116- Gait training, 97014- Electrical stimulation (unattended), Patient/Family education, Stair training, Taping, Dry Needling, Cryotherapy, and Moist heat  PLAN FOR NEXT SESSION: progress per protocol    Letitia Libra, PT, DPT, ATC 03/20/23 4:59 PM

## 2023-03-25 ENCOUNTER — Ambulatory Visit: Payer: 59

## 2023-03-26 ENCOUNTER — Ambulatory Visit: Payer: 59 | Admitting: Orthopaedic Surgery

## 2023-03-31 ENCOUNTER — Ambulatory Visit: Payer: 59 | Admitting: Orthopaedic Surgery

## 2023-03-31 ENCOUNTER — Ambulatory Visit: Payer: 59 | Attending: Orthopaedic Surgery

## 2023-03-31 DIAGNOSIS — M6281 Muscle weakness (generalized): Secondary | ICD-10-CM | POA: Insufficient documentation

## 2023-03-31 DIAGNOSIS — M25551 Pain in right hip: Secondary | ICD-10-CM | POA: Insufficient documentation

## 2023-03-31 DIAGNOSIS — R2689 Other abnormalities of gait and mobility: Secondary | ICD-10-CM | POA: Diagnosis present

## 2023-03-31 NOTE — Therapy (Signed)
 OUTPATIENT PHYSICAL THERAPY LOWER EXTREMITY TREATMENT    Patient Name: Hailey [name] MRN: [id number] DOB:1961-02-12, 63 y.o., female Today's Date: 03/31/2023  END OF SESSION:  PT End of Session - 03/31/23 1537     Visit Number 14    Number of Visits 18    Date for PT Re-Evaluation 04/26/23    Authorization Type BCBS    PT Start Time 1537    PT Stop Time 1616    PT Time Calculation (min) 39 min    Activity Tolerance Patient tolerated treatment well    Behavior During Therapy [facility] for tasks assessed/performed                        Past Medical History:  Diagnosis Date   COVID 03/2020   Endometriosis    GERD (gastroesophageal reflux disease)    Hyperprolactinemia (HCC)    MRI 1992, 2001 AND 2003. LAST MRI NORMAL.   Psoriasis    Past Surgical History:  Procedure Laterality Date   BUNIONECTOMY     DILATION AND CURETTAGE OF UTERUS  2002   AT TIME OF HYSTEROSCOPY   FOOT SURGERY     spurs   GLUTEUS MINIMUS REPAIR Right 01/07/2023   Procedure: RIGHT HIP GLUTEUS MEDIUS REPAIR,POSSIBLE COLLAGEN PATCH AUGUMENTATION;  Surgeon: Wilhelmenia Harada, MD;  Location: [facility];  Service: Orthopedics;  Laterality: Right;   HYSTEROSCOPY  2002   AT TIME OF DIAG LAP A HYSTEROSCOPY, D&C WAS DONE   IT band surg     MOUTH SURGERY     PELVIC LAPAROSCOPY  2002   DIAG LAP W LASER DONE WITH HYSTEROSCOPY,D&C   TOTAL ABDOMINAL HYSTERECTOMY  01/11/02   Patient Active Problem List   Diagnosis Date Noted   Tear of right gluteus medius tendon 01/07/2023   Unilateral primary osteoarthritis, left knee 08/04/2017   Unilateral primary osteoarthritis, right knee 08/04/2017   Chronic pain of left knee 11/04/2016   Chronic pain of right knee 11/04/2016   Trochanteric bursitis, right hip 07/17/2016   Right hand pain 04/14/2016   ACUTE PHARYNGITIS 12/23/2008   ALLERGIC RHINITIS 12/23/2008    PCP: Darnelle Elders, PA-C  REFERRING PROVIDER: Orson Blalock  REFERRING DIAG: R  hip glut repair  THERAPY DIAG:  Muscle weakness (generalized)  Other abnormalities of gait and mobility  Pain in right hip  Rationale for Evaluation and Treatment: Rehabilitation  ONSET DATE: Date of surgery 01/07/23  (2 WEEKS 12/3 AND 6 WEEKS IS 02/18/23)  SUBJECTIVE:   SUBJECTIVE STATEMENT: "My hip feels good."   PERTINENT HISTORY: GI issues, h/o kidney stones PAIN:  Are you having pain? No   PRECAUTIONS: Other: see protocol on file for restrictions   WEIGHT BEARING RESTRICTIONS: Yes Per Deon Flatter, LAT patient is WBAT   FALLS:  Has patient fallen in last 6 months? 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  PATIENT SURVEYS:  FOTO 29%, goal to 58% 02/11/23: 55% function  03/11/23: 67% function  SENSATION: WFL  -- notes mild numbness lateral hip on back side of bandage  EDEMA:  Erythema and bruising noted around surgical site  POSTURE:  WNLs  LOWER EXTREMITY ZOX:WRUE hip flexion t 90 per protocolPROM abduction to 15 degrees avoided IR, ER and adduction at this time.   SAQ and pelvic tilt  Passive ROM Right eval Left eval 01/20/23 Right  02/04/23 Right  02/21/23 Right AROM 03/11/23 Right AROM  Hip flexion To 90 degrees without pain  90 PROM 108 PROM  111 120  Hip extension        Hip abduction 15 degrees PROM    30 35  Hip adduction        Hip internal rotation        Hip external rotation        Knee flexion        Knee extension        Ankle dorsiflexion        Ankle plantarflexion        Ankle inversion        Ankle eversion          (Blank rows = not tested)  LOWER EXTREMITY MMT: not tested-- patient demonstrates good overall strength against gravity with L LE per functional activities  MMT Right 03/11/23 Left 03/11/23  Hip flexion 4+ 4+   Hip extension 4-   Hip abduction 3+   Hip adduction    Hip internal rotation    Hip external rotation    Knee flexion    Knee extension    Ankle dorsiflexion    Ankle plantarflexion    Ankle inversion    Ankle eversion     (Blank rows = not tested)   FUNCTIONAL TESTS:  1.33 ft/sec gait speed   GAIT: Distance walked: with rollator RW ---- PT adjusted height so patient could use for WB through [employer] to offload R LE to match 50% WB status. Assistive device utilized: Environmental consultant - 4 wheeled Level of assistance: Modified independence Comments: Stair negotiation reviewed and loaner RW from out clinic provided-- patient was going up/down her 2 steps from garage into home without rails using husband to lean on--- not maintaining WB precautions. [facility] Adult PT Treatment:                                                DATE: 03/31/23 Therapeutic Exercise: Recumbent bike level 2 x 5 minutes  Hip bridge with knee extension 2 x 10  3 way hip x 5 each  Updated HEP   Therapeutic Activity: Floor transfer Stair negotiation 1 flight, reciprocal, handrail use  Step up knee driver 6 inch step x 10    [facility] Adult PT Treatment:                                                DATE: 03/20/23 Therapeutic Exercise: Elliptical level 1 x 3 minutes  Figure 4 stretch 2 x 30 sec  IT band stretch 2 x 30 sec  Supine hip flexor stretch 2 x 30 sec  Lateral step down 2 x 10; 4 inch step  SL leg press 2 x 10 @ 40 lbs  Lateral band walks 2 sets d/b x 15 ft  Bodyweight squats x 10  Updated HEP    [facility] Adult PT  Treatment:                                                DATE: 03/11/23 Therapeutic Exercise: SLS RLE 2 x 30 sec with single UE support  Step ups 6 inch 2 x 10  Lateral step ups 6 inch 2 x 10   Updated HEP   Therapeutic Activity: Re-assessment to determine overall progress, educating patient on progress towards goals.     PATIENT EDUCATION:  Education details: HEP update Person educated: Patient Education method: Explanation, demo, cues, handout Education comprehension: verbalized understanding, returned demo, cues   HOME EXERCISE PROGRAM: Access Code: [email or web address] URL: [email or web address] Date: 03/31/2023 Prepared by: Forrestine Ike  Exercises - Supine Quadriceps Stretch with Strap on Table  - 1 x daily - 7 x weekly - 3 sets - 30 sec  hold - Supine Figure 4 Piriformis Stretch  - 1 x daily - 7 x weekly - 3 sets - 30  hold - Quadruped Rocking Slow  - 1 x daily - 7 x weekly - 1 sets - 10 reps - Hooklying Single Knee to Chest Stretch  - 1 x daily - 7 x weekly - 1 sets - 10 reps - 5 sec  hold - Supine March with Posterior Pelvic Tilt  - 1 x daily - 3 x weekly - 2 sets - 10 reps - Standing Hip Abduction with Counter Support  - 1 x daily - 3 x weekly - 2 sets - 10 reps - Standing Hip Extension with Counter Support  - 1 x daily - 3 x weekly - 2 sets - 10 reps - Standing Marching  - 1 x daily - 3 x weekly - 2 sets - 10 reps - Single Leg Stance with Support  - 1 x daily - 3 x weekly - 3 sets - 30 sec hold - Lateral Step Up  - 1 x daily - 3 x weekly - 3 sets - 10 reps - Side Stepping with Resistance at Ankles  - 1 x daily - 3 x weekly - 3 sets - 10 reps - Squat  - 1 x daily - 3 x weekly - 2 sets - 10 reps - Runner's Step Up/Down  - 1 x daily - 3 x weekly - 2 sets - 10 reps - Supine Bridge with Leg Extension  - 1 x daily - 3 x weekly - 2 sets - 10 reps  ASSESSMENT:  CLINICAL IMPRESSION: Continued with hip strength progression per post-operative protocol with good tolerance. She was able to complete floor transfer independently without onset of hip pain. She demonstrates excellent stability with stair negotiation with use of handrail. With step up activity she does  have mild sway during stance on the RLE without UE support. No reports of hip pain throughout session.   OBJECTIVE IMPAIRMENTS: Abnormal gait, decreased activity tolerance, decreased balance, difficulty walking, decreased ROM, decreased strength, impaired flexibility, and pain.   ACTIVITY LIMITATIONS: bending, sitting, squatting, and locomotion level  PARTICIPATION LIMITATIONS: meal prep, cleaning, and occupation  PERSONAL FACTORS: 1 comorbidity: h/o L IT band release  are also affecting patient's functional outcome.   REHAB POTENTIAL: Good  CLINICAL DECISION MAKING: Stable/uncomplicated  EVALUATION COMPLEXITY: Low   GOALS: Goals reviewed with patient? Yes  SHORT TERM GOALS: Target date: 02/09/23 The patient will be indep with HEP Baseline: initiated at  evval Goal status: MET  2.  Improve FOTO to 45% Baseline:  29% Goal status: MET  3.  The patient will tolerate bike (raised seat due to hip flexor ROM limitations) x 10 min for activity toleance.  Baseline:  to perform 03/11/23: bike x 10 minutes  Goal status: MET  4.  Improve gait speed to > or equal to 1.8 ft/sec Baseline:  1.33 ft/sec 02/17/23: 2.3 ft/sec  Goal status: MET  LONG TERM GOALS: Target date: 04/26/23  The patient will be indep with HEP progression Baseline:  initiated at eval Goal status: progressing   2.  The patient will improve FOTO to > or equal to 58% Baseline:  29% Goal status: MET  3.  The patient will return to normalized gait without device. Baseline:  see above 03/11/23: mild trendelenburg  Goal status: progressing   4.  The patient will negotiate steps with recirpocal pattern  Baseline:  step to pattern 50% WB 03/11/23: step to descent; reciprocal ascent  Goal status: progressing   5.  The patient will improve gait speed to > or equal to 2.8 ft/sec Baseline:  1.33 ft/sec 03/11/23: 2.9 ft/sec Goal status: MET   PLAN:  PT FREQUENCY: 1x/week  PT DURATION: 6 weeks  PLANNED  INTERVENTIONS: 97110-Therapeutic exercises, 97530- Therapeutic activity, 97112- Neuromuscular re-education, 97535- Self Care, 16109- Manual therapy, 97116- Gait training, 97014- Electrical stimulation (unattended), Patient/Family education, Stair training, Taping, Dry Needling, Cryotherapy, and Moist heat  PLAN FOR NEXT SESSION: progress per protocol    Forrestine Ike, PT, DPT, ATC 03/31/23 4:18 PM

## 2023-04-07 ENCOUNTER — Ambulatory Visit: Payer: 59

## 2023-04-07 DIAGNOSIS — M6281 Muscle weakness (generalized): Secondary | ICD-10-CM

## 2023-04-07 DIAGNOSIS — R2689 Other abnormalities of gait and mobility: Secondary | ICD-10-CM

## 2023-04-07 DIAGNOSIS — M25551 Pain in right hip: Secondary | ICD-10-CM

## 2023-04-07 NOTE — Therapy (Addendum)
 OUTPATIENT PHYSICAL THERAPY LOWER EXTREMITY TREATMENT PHYSICAL THERAPY DISCHARGE SUMMARY  Visits from Start of Care: 15  Current functional level related to goals / functional outcomes: See goals below   Remaining deficits: Status unknown   Education / Equipment: N/A   Patient agrees to discharge. Patient goals were partially met. Patient is being discharged due to the patient's request.     Patient Name: Hailey [name] MRN: [id number] DOB:1960/03/31, 63 y.o., female Today's Date: 04/07/2023  END OF SESSION:  PT End of Session - 04/07/23 1531     Visit Number 15    Number of Visits 18    Date for PT Re-Evaluation 04/26/23    Authorization Type BCBS    PT Start Time 1531    PT Stop Time 1615    PT Time Calculation (min) 44 min    Activity Tolerance Patient tolerated treatment well    Behavior During Therapy [facility] for tasks assessed/performed                        Past Medical History:  Diagnosis Date   COVID 03/2020   Endometriosis    GERD (gastroesophageal reflux disease)    Hyperprolactinemia (HCC)    MRI 1992, 2001 AND 2003. LAST MRI NORMAL.   Psoriasis    Past Surgical History:  Procedure Laterality Date   BUNIONECTOMY     DILATION AND CURETTAGE OF UTERUS  2002   AT TIME OF HYSTEROSCOPY   FOOT SURGERY     spurs   GLUTEUS MINIMUS REPAIR Right 01/07/2023   Procedure: RIGHT HIP GLUTEUS MEDIUS REPAIR,POSSIBLE COLLAGEN PATCH AUGUMENTATION;  Surgeon: Wilhelmenia Harada, MD;  Location: [facility];  Service: Orthopedics;  Laterality: Right;   HYSTEROSCOPY  2002   AT TIME OF DIAG LAP A HYSTEROSCOPY, D&C WAS DONE   IT band surg     MOUTH SURGERY     PELVIC LAPAROSCOPY  2002   DIAG LAP W LASER DONE WITH HYSTEROSCOPY,D&C   TOTAL ABDOMINAL HYSTERECTOMY  01/11/02   Patient Active Problem List   Diagnosis Date Noted   Tear of right gluteus medius tendon 01/07/2023   Unilateral primary osteoarthritis, left knee 08/04/2017   Unilateral  primary osteoarthritis, right knee 08/04/2017   Chronic pain of left knee 11/04/2016   Chronic pain of right knee 11/04/2016   Trochanteric bursitis, right hip 07/17/2016   Right hand pain 04/14/2016   ACUTE PHARYNGITIS 12/23/2008   ALLERGIC RHINITIS 12/23/2008    PCP: Darnelle Elders, PA-C  REFERRING PROVIDER: Orson Blalock  REFERRING DIAG: R hip glut repair  THERAPY DIAG:  Muscle weakness (generalized)  Other abnormalities of gait and mobility  Pain in right hip  Rationale for Evaluation and Treatment: Rehabilitation  ONSET DATE: Date of surgery 01/07/23  (2 WEEKS 12/3 AND 6 WEEKS IS 02/18/23)  SUBJECTIVE:   SUBJECTIVE STATEMENT: Patient traveled to [facility] over the weekend and did fine with walking and riding in the car. She has f/u with Dr. Hermina Loosen on Friday.   PERTINENT HISTORY: GI issues, h/o kidney stones PAIN:  Are you having pain? No   PRECAUTIONS: Other: see protocol on file for restrictions   WEIGHT BEARING RESTRICTIONS: Yes Per Deon Flatter, LAT patient is WBAT   FALLS:  Has patient fallen in last 6 months? 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  PATIENT SURVEYS:  FOTO 29%, goal to 58% 02/11/23: 55% function  03/11/23: 67% function  SENSATION: WFL  -- notes mild numbness lateral hip on back side of bandage  EDEMA:  Erythema and bruising noted around surgical site  POSTURE: WNLs  LOWER EXTREMITY ZOX:WRUE hip flexion t 90 per protocolPROM abduction to 15 degrees avoided IR, ER and adduction at this time.   SAQ and pelvic tilt  Passive ROM  Right eval Left eval 01/20/23 Right  02/04/23 Right  02/21/23 Right AROM 03/11/23 Right AROM  Hip flexion To 90 degrees without pain  90 PROM 108 PROM  111 120  Hip extension        Hip abduction 15 degrees PROM    30 35  Hip adduction        Hip internal rotation        Hip external rotation        Knee flexion        Knee extension        Ankle dorsiflexion        Ankle plantarflexion        Ankle inversion        Ankle eversion         (Blank rows = not tested)  LOWER EXTREMITY MMT: not tested-- patient demonstrates good overall strength against gravity with L LE per functional activities  MMT Right 03/11/23 Left 03/11/23 04/07/23 Right   Hip flexion 4+ 4+  5  Hip extension 4-  4  Hip abduction 3+  4-  Hip adduction     Hip internal rotation     Hip external rotation     Knee flexion     Knee extension     Ankle dorsiflexion     Ankle plantarflexion     Ankle inversion     Ankle eversion      (Blank rows = not tested)   FUNCTIONAL TESTS:  1.33 ft/sec gait speed   GAIT: Distance walked: with rollator RW ---- PT adjusted height so patient could use for WB through [facility] to offload R LE to match 50% WB status. Assistive device utilized: Environmental consultant - 4 wheeled Level of assistance: Modified independence Comments: Stair negotiation reviewed and loaner RW from out clinic provided-- patient was going up/down her 2 steps from garage into home without rails using husband to lean on--- not maintaining WB precautions. [facility] Adult PT Treatment:                                                DATE: 04/07/23 Therapeutic Exercise: Standing hip abduction red band 2 x 10  Standing hip extension red band 2 x 10  Sidelying hip abduction 2 x 10  Updated HEP   Therapeutic Activity: Squat to table x 10 @ 5 lbs; x 10 @ 10 lbs  Reverse mini lunge x 10 each   Self Care: Discussed rehab protocol Discussed gym classes that would be appropriate to ease into Discussed machine based  strengthening to complete at gym    [facility] Adult PT Treatment:  DATE: 03/31/23 Therapeutic Exercise: Recumbent bike level 2 x 5 minutes  Hip bridge with knee extension 2 x 10  3 way hip x 5 each  Updated HEP   Therapeutic Activity: Floor transfer Stair negotiation 1 flight, reciprocal, handrail use  Step up knee driver 6 inch step x 10    [facility] Adult PT Treatment:                                                DATE: 03/20/23 Therapeutic Exercise: Elliptical level 1 x 3 minutes  Figure 4 stretch 2 x 30 sec  IT band stretch 2 x 30 sec  Supine hip flexor stretch 2 x 30 sec  Lateral step down 2 x 10; 4 inch step  SL leg press 2 x 10 @ 40 lbs  Lateral band walks 2 sets d/b x 15 ft  Bodyweight squats x 10  Updated HEP      PATIENT EDUCATION:  Education details: HEP update Person educated: Patient Education method: Explanation, demo, cues, handout Education comprehension: verbalized understanding, returned demo, cues   HOME EXERCISE PROGRAM: Access Code: [email or web address] URL: [email or web address] Date: 04/07/2023 Prepared by: Forrestine Ike  Exercises - Supine Quadriceps Stretch with Strap on Table  - 1 x daily - 7 x weekly - 3 sets - 30 sec  hold - Supine Figure 4 Piriformis Stretch  - 1 x daily - 7 x weekly - 3 sets - 30  hold - Quadruped Rocking Slow  - 1 x daily - 7 x weekly - 1 sets - 10 reps - Hooklying Single Knee to Chest Stretch  - 1 x daily - 7 x weekly - 1 sets - 10 reps - 5 sec  hold - Supine March with Posterior Pelvic Tilt  - 1 x daily - 3 x weekly - 2 sets - 10 reps - Standing Marching  - 1 x daily - 3 x weekly - 2 sets - 10 reps - Single Leg Stance with Support  - 1 x daily - 3 x weekly - 3 sets - 30 sec hold - Lateral Step Up  - 1 x daily - 3 x weekly - 3 sets - 10 reps - Side Stepping with Resistance at Ankles  - 1 x daily - 3 x weekly - 3 sets - 10 reps - Squat  - 1 x daily - 3 x weekly - 2 sets - 10  reps - Runner's Step Up/Down  - 1 x daily - 3 x weekly - 2 sets - 10 reps - Supine Bridge with Leg Extension  - 1 x daily - 3 x weekly - 2 sets - 10 reps - Standing Hip Abduction with Resistance at Ankles and Counter Support  - 1 x daily - 3 x weekly - 2 sets - 10 reps - Standing Hip Extension with Resistance at Ankles and Counter Support  - 1 x daily - 3 x weekly - 2 sets - 10 reps - Sidelying Hip Abduction  - 1 x daily - 3 x weekly - 2 sets - 10 reps  ASSESSMENT:  CLINICAL IMPRESSION: Patient arrives without reports of pain. She has f/u with Dr. Hermina Loosen on Friday and would like to hold on further PT visits until she sees him. She is progressing as expected in PT tolerating standing strength progression well without onset of hip pain,  but does fatigue in the Rt hip towards end of session. Continues to have mild gluteal weakness with MMT today.   OBJECTIVE IMPAIRMENTS: Abnormal gait, decreased activity tolerance, decreased balance, difficulty walking, decreased ROM, decreased strength, impaired flexibility, and pain.   ACTIVITY LIMITATIONS: bending, sitting, squatting, and locomotion level  PARTICIPATION LIMITATIONS: meal prep, cleaning, and occupation  PERSONAL FACTORS: 1 comorbidity: h/o L IT band release are also affecting patient's functional outcome.   REHAB POTENTIAL: Good  CLINICAL DECISION MAKING: Stable/uncomplicated  EVALUATION COMPLEXITY: Low   GOALS: Goals reviewed with patient? Yes  SHORT TERM GOALS: Target date: 02/09/23 The patient will be indep with HEP Baseline: initiated at evval Goal status: MET  2.  Improve FOTO to 45% Baseline:  29% Goal status: MET  3.  The patient will tolerate bike (raised seat due to hip flexor ROM limitations) x 10 min for activity toleance.  Baseline:  to perform 03/11/23: bike x 10 minutes  Goal status: MET  4.  Improve gait speed to > or equal to 1.8 ft/sec Baseline:  1.33 ft/sec 02/17/23: 2.3 ft/sec  Goal status: MET  LONG  TERM GOALS: Target date: 04/26/23  The patient will be indep with HEP progression Baseline:  initiated at eval Goal status: progressing   2.  The patient will improve FOTO to > or equal to 58% Baseline:  29% Goal status: MET  3.  The patient will return to normalized gait without device. Baseline:  see above 03/11/23: mild trendelenburg  Goal status: progressing   4.  The patient will negotiate steps with recirpocal pattern  Baseline:  step to pattern 50% WB 03/11/23: step to descent; reciprocal ascent  Goal status: progressing   5.  The patient will improve gait speed to > or equal to 2.8 ft/sec Baseline:  1.33 ft/sec 03/11/23: 2.9 ft/sec Goal status: MET   PLAN:  PT FREQUENCY: 1x/week  PT DURATION: 6 weeks  PLANNED INTERVENTIONS: 97110-Therapeutic exercises, 97530- Therapeutic activity, 97112- Neuromuscular re-education, 97535- Self Care, 16109- Manual therapy, 97116- Gait training, 97014- Electrical stimulation (unattended), Patient/Family education, Stair training, Taping, Dry Needling, Cryotherapy, and Moist heat  PLAN FOR NEXT SESSION: n/a d/c   Forrestine Ike, PT, DPT, ATC 04/07/23 4:20 PM  Forrestine Ike, PT, DPT, ATC 06/26/23 8:34 AM

## 2023-04-11 ENCOUNTER — Ambulatory Visit (HOSPITAL_BASED_OUTPATIENT_CLINIC_OR_DEPARTMENT_OTHER): Payer: 59 | Admitting: Orthopaedic Surgery

## 2023-04-11 DIAGNOSIS — S76011A Strain of muscle, fascia and tendon of right hip, initial encounter: Secondary | ICD-10-CM

## 2023-04-11 NOTE — Progress Notes (Signed)
Post Operative Evaluation    Procedure/Date of Surgery: Right hip gluteus medius repair  11/19  Interval History:    Presents 12 weeks status post right hip gluteus medius repair overall doing very well.  At this time Hailey [name] has really no pain.  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  Hailey [name] is continue to participate in physical therapy.  Hailey [name] was able to go for a walk with her dog which went well quite well   PMH/PSH/Family History/Social History/Meds/Allergies:    Past Medical History:  Diagnosis Date   COVID 03/2020   Endometriosis    GERD (gastroesophageal reflux disease)    Hyperprolactinemia (HCC)    MRI 1992, 2001 AND 2003. LAST MRI NORMAL.   Psoriasis    Past Surgical History:  Procedure Laterality Date   BUNIONECTOMY     DILATION AND CURETTAGE OF UTERUS  2002   AT TIME OF HYSTEROSCOPY   FOOT SURGERY     spurs   GLUTEUS MINIMUS REPAIR Right 01/07/2023   Procedure: RIGHT HIP GLUTEUS MEDIUS REPAIR,POSSIBLE COLLAGEN PATCH AUGUMENTATION;  Surgeon: [name], MD;  Location: [facility];  Service: Orthopedics;  Laterality: Right;   HYSTEROSCOPY  2002   AT TIME OF DIAG LAP A HYSTEROSCOPY, D&C WAS DONE   IT band surg     MOUTH SURGERY     PELVIC LAPAROSCOPY  2002   DIAG LAP W LASER DONE WITH HYSTEROSCOPY,D&C   TOTAL ABDOMINAL HYSTERECTOMY  01/11/02   Social History   Socioeconomic History   Marital status: Married    Spouse name: Not on file   Number of children: Not on file   Years of education: Not on file   Highest education level: Not on file  Occupational History   Not on file  Tobacco Use   Smoking status: Never   Smokeless tobacco: Never  Vaping Use   Vaping status: Never Used  Substance and Sexual Activity   Alcohol use: Yes    Comment: 0-2 month   Drug use: No   Sexual activity: Yes    Partners: Male    Birth control/protection: Surgical    Comment: HYST-1st intercourse 55 yo-5 partners   Other Topics Concern   Not on file  Social History Narrative   Not on file   Social Drivers of Health   Financial Resource Strain: Low Risk  (09/04/2022)   Received from [facility]   Overall Financial Resource Strain (CARDIA)    Difficulty of Paying Living Expenses: Not hard at all  Food Insecurity: No Food Insecurity (09/04/2022)   Received from [facility]   Hunger Vital Sign    Worried About Running Out of Food in the Last Year: Never true    Ran Out of Food in the Last Year: Never true  Transportation Needs: No Transportation Needs (09/04/2022)   [facility] - Transportation    Lack of Transportation (Medical): No    Lack of Transportation (Non-Medical): No  Physical Activity: Insufficiently Active (09/04/2022)   Received from [facility]   Exercise Vital Sign    Days of Exercise per Week: 3 days    Minutes of Exercise per Session: 30 min  Stress: No Stress Concern Present (09/04/2022)   Received from [facility] -  Occupational Stress Questionnaire    Feeling of Stress : Not at all  Social Connections: Socially Integrated (09/04/2022)   Received from [facility]   Social Network    How would you rate your social network (family, work, friends)?: Good participation with social networks   Family History  Problem Relation Age of Onset   Breast cancer Mother 29   ALS Mother    Cancer Father        PAROTID GLAND   Hypertension Father    Breast cancer Maternal Aunt 71   Heart failure Maternal Aunt    Cancer Maternal Aunt        Lymphoma   Heart disease Maternal Grandmother    Heart disease Paternal Grandmother    Heart failure Maternal Uncle    Allergies  Allergen Reactions   Codeine     REACTION: Itching   Current Outpatient Medications  Medication Sig Dispense Refill   albuterol (VENTOLIN HFA) 108 (90 Base) MCG/ACT inhaler      ALPRAZolam (XANAX) 0.25 MG tablet TAKE 1 TABLET BY MOUTH  EVERY 8 HOURS AS NEEDED FOR ANXIETY 30 tablet 0   APPLE CIDER VINEGAR PO Take by mouth.     Apremilast (OTEZLA) 30 MG TABS 1 tablet     Ascorbic Acid (VITAMIN C PO) Take by mouth.       aspirin EC 325 MG tablet Take 1 tablet (325 mg total) by mouth daily. 14 tablet 0   Azelastine HCl 137 MCG/SPRAY SOLN      B Complex Vitamins (VITAMIN-B COMPLEX PO) Take by mouth.       Calcium Carbonate-Vitamin D (CALCIUM + D PO) Take by mouth.       cetirizine (ZYRTEC) 10 MG tablet Take 10 mg by mouth daily.     Cholecalciferol (VITAMIN D3 PO) Take by mouth.     CINNAMON PO Take by mouth.     clobetasol cream (TEMOVATE) 0.05 % APP ON THE SKIN BID PRN  0   Cranberry 500 MG TABS Take by mouth.     cycloSPORINE (RESTASIS) 0.05 % ophthalmic emulsion 1 drop 2 (two) times daily.     diclofenac Sodium (VOLTAREN) 1 % GEL Apply 4 g topically 4 (four) times daily. (Patient not taking: Reported on 01/10/2023) 350 g 1   dicyclomine (BENTYL) 10 MG capsule Take by mouth. 60mg  daily     fluticasone (FLONASE) 50 MCG/ACT nasal spray      ipratropium (ATROVENT) 0.06 % nasal spray      MAGNESIUM PO Take by mouth.     Multiple Vitamins-Iron (MULTIVITAMIN/IRON PO) Take by mouth.       Multiple Vitamins-Minerals (ZINC PO) Take by mouth.     nystatin-triamcinolone ointment (MYCOLOG) Apply 1 application topically 2 (two) times daily. 30 g 0   Omega-3 Fatty Acids (FISH OIL PO) Take by mouth.       oxyCODONE (ROXICODONE) 5 MG immediate release tablet Take 1 tablet (5 mg total) by mouth every 4 (four) hours as needed for severe pain (pain score 7-10) or breakthrough pain. 10 tablet 0   POTASSIUM PO Take by mouth.       Probiotic Product (PROBIOTIC-10 PO) Take by mouth.     Red Yeast Rice Extract (RED YEAST RICE PO) Take by mouth.     Turmeric 400 MG CAPS Take by mouth.     UNABLE TO FIND Liver care capsules Stress care caps menocare - hot flushes shatavari - supplement     vitamin B-12 (  CYANOCOBALAMIN) 100 MCG tablet       VITAMIN E PO Take by mouth.       No current facility-administered medications for this visit.   No results found.  Review of Systems:   A ROS was performed including pertinent positives and negatives as documented in the HPI.   Musculoskeletal Exam:     Right hip incision is well-appearing without erythema or drainage.  Range of motion is 30 degrees internal/external rotation.  Abduction not tested today.  Walks with mild antalgic gait.  Distal neurosensory exam is intact.  Imaging:      I personally reviewed and interpreted the radiographs.   Assessment:   12 weeks status post right hip gluteus medius repair overall doing extremely well.  At this time Hailey [name] will continue to work on endurance and strengthening.  Overall I do believe that Hailey [name] may slowly discontinue physical therapy at this time.  I will plan to see her back as needed  Plan :    -Return to clinic as needed      I personally saw and evaluated the patient, and participated in the management and treatment plan.  [name], MD Attending Physician, Orthopedic Surgery  This document was dictated using Dragon voice recognition software. A reasonable attempt at proof reading has been made to minimize errors.

## 2023-05-30 ENCOUNTER — Other Ambulatory Visit: Payer: Self-pay

## 2023-05-30 MED ORDER — ALPRAZOLAM 0.25 MG PO TABS: ORAL_TABLET | ORAL | 0 refills | Status: AC

## 2023-05-30 NOTE — Telephone Encounter (Signed)
 Med refill request: xanax  Last AEX: 12/03/22 Next AEX: 12/10/23  Last MMG (if hormonal med) 09/11/22 birads cat 1 neg  Refill authorized: last rx 11/08/22 #30 with 0 refills. Please approve or deny

## 2023-07-29 ENCOUNTER — Other Ambulatory Visit: Payer: Self-pay | Admitting: Nurse Practitioner

## 2023-07-29 DIAGNOSIS — Z1231 Encounter for screening mammogram for malignant neoplasm of breast: Secondary | ICD-10-CM

## 2023-09-17 ENCOUNTER — Ambulatory Visit

## 2023-09-17 DIAGNOSIS — Z1231 Encounter for screening mammogram for malignant neoplasm of breast: Secondary | ICD-10-CM | POA: Diagnosis not present

## 2023-12-08 ENCOUNTER — Ambulatory Visit: Payer: BC Managed Care – PPO | Admitting: Nurse Practitioner

## 2023-12-10 ENCOUNTER — Encounter: Payer: Self-pay | Admitting: Nurse Practitioner

## 2023-12-10 ENCOUNTER — Ambulatory Visit: Admitting: Nurse Practitioner

## 2023-12-10 VITALS — BP 108/76 | HR 70 | Ht 62.75 in | Wt 137.0 lb

## 2023-12-10 DIAGNOSIS — N8110 Cystocele, unspecified: Secondary | ICD-10-CM

## 2023-12-10 DIAGNOSIS — Z1331 Encounter for screening for depression: Secondary | ICD-10-CM

## 2023-12-10 DIAGNOSIS — Z23 Encounter for immunization: Secondary | ICD-10-CM | POA: Diagnosis not present

## 2023-12-10 DIAGNOSIS — Z780 Asymptomatic menopausal state: Secondary | ICD-10-CM | POA: Diagnosis not present

## 2023-12-10 DIAGNOSIS — Z01419 Encounter for gynecological examination (general) (routine) without abnormal findings: Secondary | ICD-10-CM | POA: Diagnosis not present

## 2023-12-10 NOTE — Progress Notes (Signed)
 Hailey [name] Jul 10, 1960 [id number]   History:  63 y.o. G2P1011 presents for annual exam. S/P 2003 TAH for endometriosis. No HRT. Normal pap history. Recently used a mirror to assess a possible fissure and noticed a bulge-like area at vaginal opening. Denies pain or urinary incontinence but does have trouble emptying bladder. Sometimes goes to restroom 5-6 times per hour. This is not new for her and she has actually had her urethra stretched a few times.   Gynecologic History Patient's last menstrual period was 01/11/2002.   Contraception/Family planning: status post hysterectomy Sexually active: Yes  Health Maintenance Last Pap: 10/29/2019. Results were: Normal Last mammogram: 09/17/2023. Results were: Normal Last colonoscopy: 2022, 5-year recall Last Dexa: 01/01/2020. Results were: Normal     12/10/2023    3:08 PM  Depression screen PHQ 2/9  Decreased Interest 0  Down, Depressed, Hopeless 0  PHQ - 2 Score 0     Past medical history, past surgical history, family history and social history were all reviewed and documented in the EPIC chart. Married. 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 58 yo son, lives in [place]. Mother diagnosed with breast cancer at 33, maternal aunt at 19.   ROS:  A ROS was performed and pertinent positives and negatives are included.  Exam:  Vitals:   12/10/23 1504  BP: 108/76  Pulse: 70  SpO2: 98%  Weight: 137 lb (62.1 kg)  Height: 5' 2.75 (1.594 m)    Body mass index is 24.46 kg/m.   General appearance:  Normal Thyroid:  Symmetrical, normal in size, without palpable masses or nodularity. Respiratory  Auscultation:  Clear without wheezing or rhonchi Cardiovascular  Auscultation:  Regular rate, without rubs, murmurs or gallops  Edema/varicosities:  Not grossly evident Abdominal  Soft,nontender, without masses, guarding or rebound.  Liver/spleen:  No organomegaly noted  Hernia:  None appreciated   Skin  Inspection:  Grossly normal Breasts: Examined lying and sitting.   Right: Without masses, retractions, nipple discharge or axillary adenopathy.   Left: Without masses, retractions, nipple discharge or axillary adenopathy. Pelvic: External genitalia:  no lesions              Urethra:  normal appearing urethra with no masses, tenderness or lesions              Bartholins and Skenes: normal                 Vagina: Grade 2-3 cystocele, normal color and discharge, no lesions              Cervix: absent Bimanual Exam:  Uterus:  absent              Adnexa: no mass, fullness, tenderness              Rectovaginal: Deferred              Anus:  normal, no lesions  Hailey [name], CMA present as chaperone.   Assessment/Plan:  63 y.o. G2P1011 for annual exam.   Well female exam with routine gynecological exam - Education provided on SBEs, importance of preventative screenings, current guidelines, high calcium diet, regular exercise, and multivitamin daily.  Labs with PCP.   Postmenopausal - No HRT. S/P TAH for endometriosis.   Encounter for immunization - Plan: Flu vaccine trivalent PF, 6mos and older(Flulaval,Afluria,Fluarix,Fluzone)  Baden-Walker grade 3 cystocele - Plan: Ambulatory referral to Urogynecology. Denies pain or urinary incontinence but does have trouble emptying bladder. Sometimes goes to restroom  5-6 times per hour. This is not new for her and she has actually had her urethra stretched a few times.   Screening for cervical cancer - Normal Pap history.  No longer screening per guidelines.   Screening for breast cancer - Normal mammogram history.  Continue annual screenings.  Normal breast exam today.  Screening for colon cancer - 2022 colonoscopy. Will repeat at 5-year interval per GI's recommendation.   Screening for osteoporosis - Normal bone density in 2021. Will repeat at 5-year interval per recommendation.   Return in about 1 year (around 12/09/2024) for  Annual.   Hailey DELENA Shutter DNP, 3:36 PM 12/10/2023

## 2023-12-12 ENCOUNTER — Encounter: Payer: Self-pay | Admitting: Nurse Practitioner

## 2023-12-15 NOTE — Telephone Encounter (Signed)
 Routing to [employer] on referral.   Encounter closed.

## 2023-12-22 ENCOUNTER — Encounter: Payer: Self-pay | Admitting: Radiology

## 2023-12-24 ENCOUNTER — Encounter: Payer: Self-pay | Admitting: Nurse Practitioner

## 2023-12-31 ENCOUNTER — Telehealth: Payer: Self-pay

## 2023-12-31 NOTE — Telephone Encounter (Signed)
 Patient called urogynecology to make an appt & they told her they are not taking anymore new patients right now.They told her to call back December 1st to see about scheduling an appt for February 2026. Patient is wanting to know if you want her to be seen in another office or if she should wait to be seen. Patient says she prefers to see a female. Patient is aware you are out of the office & will be back next week.

## 2023-12-31 NOTE — Telephone Encounter (Signed)
 Can send a referral to [facility] to see Dr. Alvia if she is agreeable.

## 2024-01-02 ENCOUNTER — Other Ambulatory Visit: Payer: Self-pay | Admitting: Nurse Practitioner

## 2024-01-02 DIAGNOSIS — N8110 Cystocele, unspecified: Secondary | ICD-10-CM

## 2024-03-24 ENCOUNTER — Encounter: Payer: Self-pay | Admitting: Obstetrics and Gynecology

## 2024-03-24 ENCOUNTER — Ambulatory Visit: Admitting: Obstetrics and Gynecology

## 2024-03-24 VITALS — BP 132/80 | HR 73 | Ht 63.3 in | Wt 136.8 lb

## 2024-03-24 DIAGNOSIS — N952 Postmenopausal atrophic vaginitis: Secondary | ICD-10-CM | POA: Insufficient documentation

## 2024-03-24 DIAGNOSIS — N3281 Overactive bladder: Secondary | ICD-10-CM | POA: Insufficient documentation

## 2024-03-24 DIAGNOSIS — N993 Prolapse of vaginal vault after hysterectomy: Secondary | ICD-10-CM | POA: Insufficient documentation

## 2024-03-24 DIAGNOSIS — R159 Full incontinence of feces: Secondary | ICD-10-CM | POA: Insufficient documentation

## 2024-03-24 DIAGNOSIS — R350 Frequency of micturition: Secondary | ICD-10-CM

## 2024-03-24 DIAGNOSIS — N393 Stress incontinence (female) (male): Secondary | ICD-10-CM | POA: Insufficient documentation

## 2024-03-24 LAB — POCT URINALYSIS DIP (CLINITEK)
Bilirubin, UA: NEGATIVE
Blood, UA: NEGATIVE
Glucose, UA: NEGATIVE mg/dL
Ketones, POC UA: NEGATIVE mg/dL
Nitrite, UA: NEGATIVE
POC PROTEIN,UA: NEGATIVE
Spec Grav, UA: 1.01
Urobilinogen, UA: 0.2 U/dL
pH, UA: 6

## 2024-03-24 MED ORDER — MIRABEGRON ER 25 MG PO TB24: 25.0000 mg | ORAL_TABLET | Freq: Every day | ORAL | 5 refills | Status: AC

## 2024-03-24 MED ORDER — ESTRADIOL 0.01 % VA CREA: TOPICAL_CREAM | VAGINAL | 11 refills | Status: AC

## 2024-03-24 NOTE — Assessment & Plan Note (Signed)
-  Treatment options include anti-diarrhea medication (loperamide/ Imodium OTC or prescription lomotil), fiber supplements, physical therapy, and possible sacral neuromodulation or surgery.   - recommended daily psyllium fiber supplement for stool bulking

## 2024-03-24 NOTE — Assessment & Plan Note (Signed)
-   less of an issue than urge incontinence. Can reassess need for treatment at future visits.

## 2024-03-24 NOTE — Patient Instructions (Addendum)
 Today we talked about ways to manage bladder urgency such as altering your diet to avoid irritative beverages and foods (bladder diet) as well as attempting to decrease stress and other exacerbating factors.  Limit water intake to <80oz per day   The Most Bothersome Foods* The Least Bothersome Foods*  Coffee - Regular & Decaf Tea - caffeinated Carbonated beverages - cola, non-colas, diet & caffeine-free Alcohols - Beer, Red Wine, White Wine, Champagne Fruits - Grapefruit, [place], Orange, [employer] - Cranberry, Grapefruit, Orange, Pineapple Vegetables - Tomato & Tomato Products Flavor Enhancers - Hot peppers, Spicy foods, Chili, Horseradish, Vinegar, Monosodium glutamate (MSG) Artificial Sweeteners - NutraSweet, Sweet 'N Low, Equal (sweetener), Saccharin Ethnic foods - Mexican, Thai, Indian food [employer] - low-fat & whole Fruits - Bananas, Blueberries, Honeydew melon, Pears, Raisins, Watermelon Vegetables - Broccoli, [address] Sprouts, [place], Carrots, Cauliflower, [place], Cucumber, Mushrooms, Peas, Radishes, Squash, Zucchini, White potatoes, Sweet potatoes & yams Poultry - Chicken, Eggs, Turkey, [employer] - Beef, [employer]ational [employer], Lamb Seafood - Shrimp, Festus fish, Salmon Grains - Oat, Rice Snacks - Pretzels, Popcorn  *Mitch ALF et al. Diet and its role in interstitial cystitis/bladder pain syndrome (IC/BPS) and comorbid conditions. [employer]. BJU Int. 2012 Jan 11.   We discussed the symptoms of overactive bladder (OAB), which include urinary urgency, urinary frequency, night-time urination, with or without urge incontinence.  We discussed management including behavioral therapy (decreasing bladder irritants by following a bladder diet, urge suppression strategies, timed voids, bladder retraining), physical therapy, medication.   Accidental Bowel Leakage: Our goal is to achieve formed bowel movements daily or every-other-day without leakage.  You may need to try different  combinations of the following options to find what works best for you.  Some management options include: Dietary changes (more leafy greens, vegetables and fruits; less processed foods) Fiber supplementation (Metamucil or something with psyllium as active ingredient)- daily Over-the-counter imodium (tablets or liquid) to help solidify the stool and prevent leakage of stool.   Start vaginal estrogen therapy nightly for two weeks then 2 times weekly at night for treatment of vaginal atrophy (dryness of the vaginal tissues).  Please let us  know if the prescription is too expensive and we can look for alternative options.   Vulvovaginal moisturizer Options: Vitamin E oil (pump or capsule) or cream (Gene's Vit E Cream) Coconut oil Silicone-based lubricant for use during intercourse (uberlube and wet platinum is a brand available at most drugstores) Crisco Consider the ingredients of the product - the fewer the ingredients the better!  Directions for Use: Clean and dry your hands Gently dab the vulvar/vaginal area dry as needed Apply a pea-sized amount of the moisturizer onto your fingertip Using you other hand, open the labia  Apply the moisturizer to the vulvar/vaginal tissues Wear loose fitting underwear/clothing if possible following application Use moisturize up to 3 times daily as desired.

## 2024-03-24 NOTE — Assessment & Plan Note (Signed)
 Stage II anterior, Stage I posterior, Stage I apical prolapse - For treatment of pelvic organ prolapse, we discussed options for management including expectant management, conservative management, and surgical management, such as Kegels, a pessary, pelvic floor physical therapy, and specific surgical procedures. - She is unsure if it is the prolapse that is really bothering her or the urinary urgency. We discussed that fixing the prolapse with either pessary or surgery would not guarantee improvement of bladder urgency symptoms since she is emptying her bladder well.  - Will plan to treat other issues then reassess.

## 2024-03-24 NOTE — Progress Notes (Signed)
 " New Patient Evaluation and Consultation  Referring Provider: Katina Pfeiffer, PA-C PCP: Katina Pfeiffer, PA-C Date of Service: 03/24/2024  SUBJECTIVE Chief Complaint: New Patient (Initial Visit) (Hailey [name] [name] a 64 y.o. female [name] here for cystocele.)  History of Present Illness: Hailey [name] [name] a 63 y.o. White or Caucasian female seen in consultation at the request of NP Annabella Shutter for evaluation of prolapse.    Urinary Symptoms: Leaks urine with exercise, or standing after urinating, occasionally with urgency Leaks 1-2 time(s) per day.  Pad use: none [name] not bothered by UI symptoms.  Day time voids 12-18.  Nocturia: 3-4 times per night to void. Voiding dysfunction:  does not empty bladder well.  Patient does not use a catheter to empty bladder.  When urinating, patient feels a weak stream, difficulty starting urine stream, dribbling after finishing, the need to urinate multiple times in a row, and to push on her belly or vagina to empty bladder Drinks: 16oz decaf coffee, abt 100oz+ water per day, does not drink after 8pm Has not tried medication for her bladder.   UTIs: 1 UTI's in the last year.   Reports history of kidney or bladder stones   Pelvic Organ Prolapse Symptoms:                  Patient Admits to a feeling of a bulge the vaginal area. It has been present for 5-6 months.  Patient Admits to seeing a bulge.  This bulge [name] bothersome. Has not had any prior treatment for the prolapse.   Bowel Symptom: Bowel movements: 2-3 time(s) per day Stool consistency: soft  Straining: no.  Splinting: no.  Incomplete evacuation: no.  Patient Admits to accidental bowel leakage / fecal incontinence  Occurs: 1-2 time(s) per month  Consistency with leakage: liquid Bowel regimen: none Last colonoscopy: Aug 2022   Sexual Function Sexually active: yes.  Sexual orientation: heterosexual Pain with sex: Yes, has discomfort due to prolapse Does not use a  lubricant with sex  Pelvic Pain Denies pelvic pain   Past Medical History:  Past Medical History:  Diagnosis Date   COVID 03/2020   Endometriosis    GERD (gastroesophageal reflux disease)    Hyperprolactinemia    MRI 1992, 2001 AND 2003. LAST MRI NORMAL.   Psoriasis      Past Surgical History:   Past Surgical History:  Procedure Laterality Date   BUNIONECTOMY     COLONOSCOPY  09/2020   DILATION AND CURETTAGE OF UTERUS  02/19/2000   AT TIME OF HYSTEROSCOPY   FOOT SURGERY     spurs   GLUTEUS MINIMUS REPAIR Right 01/07/2023   Procedure: RIGHT HIP GLUTEUS MEDIUS REPAIR,POSSIBLE COLLAGEN PATCH AUGUMENTATION;  Surgeon: Genelle Standing, MD;  Location: [facility];  Service: Orthopedics;  Laterality: Right;   HYSTEROSCOPY  02/19/2000   AT TIME OF DIAG LAP A HYSTEROSCOPY, D&C WAS DONE   IT band surg     MOUTH SURGERY     PELVIC LAPAROSCOPY  02/19/2000   DIAG LAP W LASER DONE WITH HYSTEROSCOPY,D&C   TOTAL ABDOMINAL HYSTERECTOMY  01/11/2002     Past OB/GYN History: OB History  Gravida Para Term Preterm AB Living  2 1 1  1 1   SAB IAB Ectopic Multiple Live Births  1        # Outcome Date GA Lbr Len/2nd Weight Sex Type Anes PTL Lv  2 SAB  1 Term      Vag-Spont      Denies any significant tearing with delivery S/p hysterectomy  Medications: Patient has a current medication list which includes the following prescription(s): albuterol, alprazolam , apple cider vinegar, otezla, ascorbic acid, azelastine hcl, b complex vitamins, calcium-vitamin d -vitamin k, cholecalciferol, cinnamon, clobetasol cream, cranberry, cyclosporine, dicyclomine, [START ON 03/25/2024] estradiol , fluticasone, ipratropium, levocetirizine, magnesium, mirabegron  er, multiple vitamins-iron, multiple vitamins-minerals, omega-3 fatty acids, potassium, probiotic product, red yeast rice extract, turmeric, UNABLE TO FIND, cyanocobalamin, and vitamin e.   Allergies: [name] allergic to  codeine.   Social History: Social History[1]  Relationship status: married Patient lives with husband.   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 Regular exercise: Yes: walking, aquafit classes, gym workouts using weights History of abuse: No  Family History:   Family History  Problem Relation Age of Onset   Breast cancer Mother 51   ALS Mother    Cancer Father        PAROTID GLAND   Hypertension Father    Breast cancer Maternal Aunt 36   Heart failure Maternal Aunt    Cancer Maternal Aunt        Lymphoma   Heart disease Maternal Grandmother    Heart disease Paternal Grandmother    Heart failure Maternal Uncle      Review of Systems: Review of Systems  Constitutional:  Positive for malaise/fatigue. Negative for fever and weight loss.  Respiratory:  Negative for cough, shortness of breath and wheezing.   Cardiovascular:  Negative for chest pain, palpitations and leg swelling.  Gastrointestinal:  Negative for abdominal pain and blood in stool.  Genitourinary:  Negative for dysuria.  Musculoskeletal:  Negative for myalgias.  Skin:  Negative for rash.  Neurological:  Negative for dizziness and headaches.  Endo/Heme/Allergies:  Does not bruise/bleed easily.       + hot flashes  Psychiatric/Behavioral:  Negative for depression. The [name] not nervous/anxious.      OBJECTIVE Physical Exam: Vitals:   03/24/24 1457  BP: 132/80  Pulse: 73  Weight: 136 lb 12.8 oz (62.1 kg)  Height: 5' 3.3 (1.608 m)    Physical Exam Vitals reviewed. Exam conducted with a chaperone present.  Constitutional:      General: [name] not in acute distress. Pulmonary:     Effort: Pulmonary effort [name] normal.  Abdominal:     General: There [name] no distension.     Palpations: Abdomen [name] soft.     Tenderness: There [name] no abdominal tenderness. There [name] no rebound.  Musculoskeletal:        General: No swelling. Normal range of motion.  Skin:    General: Skin [name] warm and  dry.     Findings: No rash.  Neurological:     Mental Status: [name] alert and oriented to person, place, and time.  Psychiatric:        Mood and Affect: Mood normal.        Behavior: Behavior normal.     GU / Detailed Urogynecologic Evaluation:  Pelvic Exam: Normal external female genitalia; Bartholin's and Skene's glands normal in appearance; urethral meatus normal in appearance, no urethral masses or discharge.   CST: negative  s/p hysterectomy: Speculum exam reveals normal vaginal mucosa with  atrophy and normal vaginal cuff.  Adnexa no mass, fullness, tenderness.    Pelvic floor strength 0/V, puborectalis I/V external anal sphincter I/V  Pelvic floor musculature: Right levator tender, Right obturator non-tender, Left levator  tender, Left obturator non-tender  POP-Q:   POP-Q  0                                            Aa   0                                           Ba  -5.5                                              C   4                                            Gh  5                                            Pb  7                                            tvl   -2                                            Ap  -2                                            Bp                                                 D      Rectal Exam:  Normal sphincter tone, small distal rectocele, enterocoele not present, no rectal masses, no sign of dyssynergia when asking the patient to bear down.  Post-Void Residual (PVR) by Bladder Scan: In order to evaluate bladder emptying, we discussed obtaining a postvoid residual and patient agreed to this procedure.  Procedure: The ultrasound unit was placed on the patient's abdomen in the suprapubic region after the patient had voided.    Post Void Residual - 03/24/24 1532       Post Void Residual   Post Void Residual 73 mL           Laboratory Results: Lab Results  Component Value Date   COLORU yellow 03/24/2024    CLARITYU clear 03/24/2024   GLUCOSEUR negative 03/24/2024   BILIRUBINUR negative 03/24/2024   SPECGRAV 1.010 03/24/2024   RBCUR negative 03/24/2024   PHUR 6.0 03/24/2024   PROTEINUR NEGATIVE 12/28/2020   UROBILINOGEN 0.2 03/24/2024   LEUKOCYTESUR Trace (A)  03/24/2024    Lab Results  Component Value Date   CREATININE 0.72 09/29/2012   CREATININE 0.69 02/28/2007    Lab Results  Component Value Date   HGBA1C 5.4 09/26/2011    Lab Results  Component Value Date   HGB 13.6 05/31/2019     ASSESSMENT AND PLAN Hailey [name] a 64 y.o. with:  1. Vaginal vault prolapse after hysterectomy   2. Overactive bladder   3. Urinary frequency   4. Vaginal atrophy   5. SUI (stress urinary incontinence, female)   6. Incontinence of feces, unspecified fecal incontinence type     Vaginal vault prolapse after hysterectomy Assessment & Plan: Stage II anterior, Stage I posterior, Stage I apical prolapse - For treatment of pelvic organ prolapse, we discussed options for management including expectant management, conservative management, and surgical management, such as Kegels, a pessary, pelvic floor physical therapy, and specific surgical procedures. - [name] unsure if it [name] the prolapse that [name] really bothering her or the urinary urgency. We discussed that fixing the prolapse with either pessary or surgery would not guarantee improvement of bladder urgency symptoms since [name] emptying her bladder well.  - Will plan to treat other issues then reassess.    Overactive bladder Assessment & Plan: We discussed the symptoms of overactive bladder (OAB), which include urinary urgency, urinary frequency, nocturia, with or without urge incontinence.  While we do not know the exact etiology of OAB, several treatment options exist. We discussed management including behavioral therapy (decreasing bladder irritants, urge suppression strategies, timed voids, bladder retraining), physical therapy,  medication. - Prescribed myrbetriq  25mg  daily. For Beta-3 agonist medication, we discussed the potential side effect of elevated blood pressure which [name] more likely to occur in individuals with uncontrolled hypertension. - Advised to decrease intake of bladder irritants and drink less water (around 60-80oz)  Orders: -     Mirabegron  ER; Take 1 tablet (25 mg total) by mouth daily.  Dispense: 30 tablet; Refill: 5  Urinary frequency -     POCT URINALYSIS DIP (CLINITEK)  Vaginal atrophy Assessment & Plan: For symptomatic vaginal atrophy options include lubrication with a water-based lubricant, personal hygiene measures and barrier protection against wetness, and estrogen replacement in the form of vaginal cream, vaginal tablets, or a time-released vaginal ring.   - Prescribes estrace  cream. Also recommended using vaginal moisturizer daily like coconut oil or vitamin E. Should also consider lubricant for intercourse.   Orders: -     Estradiol ; Place 0.5g nightly for two weeks then twice a week after  Dispense: 42.5 g; Refill: 11  SUI (stress urinary incontinence, female) Assessment & Plan: - less of an issue than urge incontinence. Can reassess need for treatment at future visits.    Incontinence of feces, unspecified fecal incontinence type Assessment & Plan: -Treatment options include anti-diarrhea medication (loperamide/ Imodium OTC or prescription lomotil), fiber supplements, physical therapy, and possible sacral neuromodulation or surgery.   - recommended daily psyllium fiber supplement for stool bulking    Return 6 weeks   Hailey LOISE Caper, MD        [1]  Social History Tobacco Use   Smoking status: Never   Smokeless tobacco: Never  Vaping Use   Vaping status: Never Used  Substance Use Topics   Alcohol use: Yes    Comment: 0-2 month   Drug use: No   "

## 2024-03-24 NOTE — Assessment & Plan Note (Addendum)
 We discussed the symptoms of overactive bladder (OAB), which include urinary urgency, urinary frequency, nocturia, with or without urge incontinence.  While we do not know the exact etiology of OAB, several treatment options exist. We discussed management including behavioral therapy (decreasing bladder irritants, urge suppression strategies, timed voids, bladder retraining), physical therapy, medication. - Prescribed myrbetriq  25mg  daily. For Beta-3 agonist medication, we discussed the potential side effect of elevated blood pressure which is more likely to occur in individuals with uncontrolled hypertension. - Advised to decrease intake of bladder irritants and drink less water (around 60-80oz)

## 2024-03-24 NOTE — Assessment & Plan Note (Signed)
 For symptomatic vaginal atrophy options include lubrication with a water-based lubricant, personal hygiene measures and barrier protection against wetness, and estrogen replacement in the form of vaginal cream, vaginal tablets, or a time-released vaginal ring.   - Prescribes estrace  cream. Also recommended using vaginal moisturizer daily like coconut oil or vitamin E. Should also consider lubricant for intercourse.

## 2024-05-04 ENCOUNTER — Ambulatory Visit: Admitting: Obstetrics and Gynecology

## 2024-12-13 ENCOUNTER — Ambulatory Visit: Admitting: Nurse Practitioner

## 2024-12-14 ENCOUNTER — Ambulatory Visit: Admitting: Nurse Practitioner
